# Patient Record
Sex: Female | Born: 1980 | Race: White | Hispanic: No | Marital: Single | Smoking: Former smoker
Health system: Southern US, Community
[De-identification: ages and names within clinical notes are randomized; demographics above are authoritative.]

## PROBLEM LIST (undated history)

## (undated) DIAGNOSIS — D1803 Hemangioma of intra-abdominal structures: Secondary | ICD-10-CM

## (undated) DIAGNOSIS — N809 Endometriosis, unspecified: Secondary | ICD-10-CM

## (undated) DIAGNOSIS — R112 Nausea with vomiting, unspecified: Secondary | ICD-10-CM

## (undated) DIAGNOSIS — G43909 Migraine, unspecified, not intractable, without status migrainosus: Secondary | ICD-10-CM

## (undated) DIAGNOSIS — Z8719 Personal history of other diseases of the digestive system: Secondary | ICD-10-CM

## (undated) DIAGNOSIS — Z9889 Other specified postprocedural states: Secondary | ICD-10-CM

## (undated) DIAGNOSIS — D1800 Hemangioma unspecified site: Secondary | ICD-10-CM

## (undated) DIAGNOSIS — N83209 Unspecified ovarian cyst, unspecified side: Secondary | ICD-10-CM

## (undated) DIAGNOSIS — S62609A Fracture of unspecified phalanx of unspecified finger, initial encounter for closed fracture: Secondary | ICD-10-CM

## (undated) DIAGNOSIS — D649 Anemia, unspecified: Secondary | ICD-10-CM

## (undated) DIAGNOSIS — I499 Cardiac arrhythmia, unspecified: Secondary | ICD-10-CM

## (undated) DIAGNOSIS — K429 Umbilical hernia without obstruction or gangrene: Secondary | ICD-10-CM

## (undated) DIAGNOSIS — Z973 Presence of spectacles and contact lenses: Secondary | ICD-10-CM

## (undated) DIAGNOSIS — F429 Obsessive-compulsive disorder, unspecified: Secondary | ICD-10-CM

## (undated) DIAGNOSIS — F419 Anxiety disorder, unspecified: Secondary | ICD-10-CM

## (undated) DIAGNOSIS — M5430 Sciatica, unspecified side: Secondary | ICD-10-CM

## (undated) DIAGNOSIS — M5412 Radiculopathy, cervical region: Secondary | ICD-10-CM

## (undated) HISTORY — PX: DIAGNOSTIC LAPAROSCOPY: SUR761

## (undated) HISTORY — PX: APPENDECTOMY: SHX54

## (undated) HISTORY — DX: Cardiac arrhythmia, unspecified: I49.9

## (undated) HISTORY — PX: WISDOM TOOTH EXTRACTION: SHX21

## (undated) HISTORY — PX: OTHER SURGICAL HISTORY: SHX169

## (undated) HISTORY — PX: DILATION AND CURETTAGE OF UTERUS: SHX78

---
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
## 2000-08-24 ENCOUNTER — Encounter: Payer: Self-pay | Admitting: Emergency Medicine

## 2000-08-24 ENCOUNTER — Emergency Department (HOSPITAL_COMMUNITY): Admission: EM | Admit: 2000-08-24 | Discharge: 2000-08-24 | Payer: Self-pay | Admitting: Emergency Medicine

## 2001-01-21 ENCOUNTER — Emergency Department (HOSPITAL_COMMUNITY): Admission: EM | Admit: 2001-01-21 | Discharge: 2001-01-21 | Payer: Self-pay

## 2001-06-11 ENCOUNTER — Other Ambulatory Visit: Admission: RE | Admit: 2001-06-11 | Discharge: 2001-06-11 | Payer: Self-pay | Admitting: Obstetrics and Gynecology

## 2001-06-15 ENCOUNTER — Inpatient Hospital Stay (HOSPITAL_COMMUNITY): Admission: AD | Admit: 2001-06-15 | Discharge: 2001-06-15 | Payer: Self-pay | Admitting: Obstetrics and Gynecology

## 2001-06-21 ENCOUNTER — Inpatient Hospital Stay (HOSPITAL_COMMUNITY): Admission: AD | Admit: 2001-06-21 | Discharge: 2001-06-21 | Payer: Self-pay | Admitting: Obstetrics and Gynecology

## 2001-08-17 ENCOUNTER — Inpatient Hospital Stay (HOSPITAL_COMMUNITY): Admission: AD | Admit: 2001-08-17 | Discharge: 2001-08-17 | Payer: Self-pay | Admitting: Obstetrics and Gynecology

## 2001-09-09 ENCOUNTER — Inpatient Hospital Stay (HOSPITAL_COMMUNITY): Admission: AD | Admit: 2001-09-09 | Discharge: 2001-09-09 | Payer: Self-pay | Admitting: Obstetrics and Gynecology

## 2001-09-09 ENCOUNTER — Encounter: Payer: Self-pay | Admitting: Obstetrics and Gynecology

## 2001-09-28 ENCOUNTER — Inpatient Hospital Stay (HOSPITAL_COMMUNITY): Admission: AD | Admit: 2001-09-28 | Discharge: 2001-09-28 | Payer: Self-pay | Admitting: Obstetrics and Gynecology

## 2001-09-28 ENCOUNTER — Encounter: Payer: Self-pay | Admitting: Obstetrics and Gynecology

## 2001-10-01 ENCOUNTER — Inpatient Hospital Stay (HOSPITAL_COMMUNITY): Admission: AD | Admit: 2001-10-01 | Discharge: 2001-10-01 | Payer: Self-pay | Admitting: Obstetrics and Gynecology

## 2001-12-12 ENCOUNTER — Inpatient Hospital Stay (HOSPITAL_COMMUNITY): Admission: AD | Admit: 2001-12-12 | Discharge: 2001-12-12 | Payer: Self-pay | Admitting: Obstetrics and Gynecology

## 2001-12-24 ENCOUNTER — Inpatient Hospital Stay (HOSPITAL_COMMUNITY): Admission: AD | Admit: 2001-12-24 | Discharge: 2001-12-24 | Payer: Self-pay | Admitting: Obstetrics and Gynecology

## 2001-12-25 ENCOUNTER — Inpatient Hospital Stay (HOSPITAL_COMMUNITY): Admission: AD | Admit: 2001-12-25 | Discharge: 2001-12-27 | Payer: Self-pay | Admitting: Obstetrics and Gynecology

## 2002-07-15 ENCOUNTER — Emergency Department (HOSPITAL_COMMUNITY): Admission: EM | Admit: 2002-07-15 | Discharge: 2002-07-15 | Payer: Self-pay | Admitting: Emergency Medicine

## 2002-07-15 ENCOUNTER — Encounter: Payer: Self-pay | Admitting: Emergency Medicine

## 2002-12-29 ENCOUNTER — Other Ambulatory Visit: Admission: RE | Admit: 2002-12-29 | Discharge: 2002-12-29 | Payer: Self-pay | Admitting: Obstetrics and Gynecology

## 2004-02-11 ENCOUNTER — Other Ambulatory Visit: Admission: RE | Admit: 2004-02-11 | Discharge: 2004-02-11 | Payer: Self-pay | Admitting: Obstetrics and Gynecology

## 2004-06-03 ENCOUNTER — Emergency Department (HOSPITAL_COMMUNITY): Admission: EM | Admit: 2004-06-03 | Discharge: 2004-06-04 | Payer: Self-pay | Admitting: Emergency Medicine

## 2004-07-12 ENCOUNTER — Encounter: Admission: RE | Admit: 2004-07-12 | Discharge: 2004-09-28 | Payer: Self-pay | Admitting: Orthopaedic Surgery

## 2004-07-26 ENCOUNTER — Encounter: Admission: RE | Admit: 2004-07-26 | Discharge: 2004-07-26 | Payer: Self-pay | Admitting: Orthopaedic Surgery

## 2004-10-28 DIAGNOSIS — S62609A Fracture of unspecified phalanx of unspecified finger, initial encounter for closed fracture: Secondary | ICD-10-CM

## 2004-10-28 HISTORY — DX: Fracture of unspecified phalanx of unspecified finger, initial encounter for closed fracture: S62.609A

## 2004-11-07 ENCOUNTER — Emergency Department (HOSPITAL_COMMUNITY): Admission: EM | Admit: 2004-11-07 | Discharge: 2004-11-07 | Payer: Self-pay | Admitting: Family Medicine

## 2005-02-27 ENCOUNTER — Emergency Department (HOSPITAL_COMMUNITY): Admission: EM | Admit: 2005-02-27 | Discharge: 2005-02-27 | Payer: Self-pay | Admitting: Emergency Medicine

## 2005-04-24 ENCOUNTER — Other Ambulatory Visit: Admission: RE | Admit: 2005-04-24 | Discharge: 2005-04-24 | Payer: Self-pay | Admitting: Obstetrics and Gynecology

## 2006-08-20 ENCOUNTER — Emergency Department (HOSPITAL_COMMUNITY): Admission: EM | Admit: 2006-08-20 | Discharge: 2006-08-20 | Payer: Self-pay | Admitting: Emergency Medicine

## 2006-08-30 ENCOUNTER — Emergency Department (HOSPITAL_COMMUNITY): Admission: EM | Admit: 2006-08-30 | Discharge: 2006-08-30 | Payer: Self-pay | Admitting: Emergency Medicine

## 2006-09-03 ENCOUNTER — Inpatient Hospital Stay (HOSPITAL_COMMUNITY): Admission: AD | Admit: 2006-09-03 | Discharge: 2006-09-03 | Payer: Self-pay | Admitting: Obstetrics & Gynecology

## 2006-11-26 ENCOUNTER — Ambulatory Visit (HOSPITAL_COMMUNITY): Admission: RE | Admit: 2006-11-26 | Discharge: 2006-11-26 | Payer: Self-pay | Admitting: Obstetrics and Gynecology

## 2006-11-26 ENCOUNTER — Encounter (INDEPENDENT_AMBULATORY_CARE_PROVIDER_SITE_OTHER): Payer: Self-pay | Admitting: Obstetrics and Gynecology

## 2006-12-30 ENCOUNTER — Inpatient Hospital Stay (HOSPITAL_COMMUNITY): Admission: AD | Admit: 2006-12-30 | Discharge: 2006-12-30 | Payer: Self-pay | Admitting: Obstetrics and Gynecology

## 2007-02-14 ENCOUNTER — Emergency Department (HOSPITAL_COMMUNITY): Admission: EM | Admit: 2007-02-14 | Discharge: 2007-02-14 | Payer: Self-pay | Admitting: Emergency Medicine

## 2007-02-15 ENCOUNTER — Emergency Department (HOSPITAL_COMMUNITY): Admission: EM | Admit: 2007-02-15 | Discharge: 2007-02-15 | Payer: Self-pay | Admitting: Emergency Medicine

## 2007-05-12 ENCOUNTER — Emergency Department (HOSPITAL_COMMUNITY): Admission: EM | Admit: 2007-05-12 | Discharge: 2007-05-12 | Payer: Self-pay | Admitting: Emergency Medicine

## 2007-07-12 ENCOUNTER — Emergency Department (HOSPITAL_COMMUNITY): Admission: EM | Admit: 2007-07-12 | Discharge: 2007-07-12 | Payer: Self-pay | Admitting: Emergency Medicine

## 2007-12-04 ENCOUNTER — Emergency Department (HOSPITAL_COMMUNITY): Admission: EM | Admit: 2007-12-04 | Discharge: 2007-12-04 | Payer: Self-pay | Admitting: Emergency Medicine

## 2008-12-07 ENCOUNTER — Emergency Department (HOSPITAL_COMMUNITY): Admission: EM | Admit: 2008-12-07 | Discharge: 2008-12-07 | Payer: Self-pay | Admitting: Emergency Medicine

## 2008-12-08 ENCOUNTER — Emergency Department (HOSPITAL_COMMUNITY): Admission: EM | Admit: 2008-12-08 | Discharge: 2008-12-09 | Payer: Self-pay | Admitting: Emergency Medicine

## 2009-02-19 ENCOUNTER — Emergency Department (HOSPITAL_COMMUNITY): Admission: EM | Admit: 2009-02-19 | Discharge: 2009-02-19 | Payer: Self-pay | Admitting: Emergency Medicine

## 2009-10-19 ENCOUNTER — Inpatient Hospital Stay (HOSPITAL_COMMUNITY): Admission: AD | Admit: 2009-10-19 | Discharge: 2009-10-19 | Payer: Self-pay | Admitting: Obstetrics & Gynecology

## 2010-01-01 ENCOUNTER — Inpatient Hospital Stay (HOSPITAL_COMMUNITY): Admission: AD | Admit: 2010-01-01 | Discharge: 2010-01-01 | Payer: Self-pay | Admitting: Obstetrics & Gynecology

## 2010-01-07 ENCOUNTER — Inpatient Hospital Stay (HOSPITAL_COMMUNITY): Admission: AD | Admit: 2010-01-07 | Discharge: 2010-01-09 | Payer: Self-pay | Admitting: Obstetrics & Gynecology

## 2010-05-10 LAB — URINALYSIS, ROUTINE W REFLEX MICROSCOPIC
Glucose, UA: NEGATIVE mg/dL
Hgb urine dipstick: NEGATIVE
Ketones, ur: NEGATIVE mg/dL
Nitrite: NEGATIVE
Protein, ur: NEGATIVE mg/dL
Specific Gravity, Urine: 1.015 (ref 1.005–1.030)
Urobilinogen, UA: 0.2 mg/dL (ref 0.0–1.0)
pH: 6.5 (ref 5.0–8.0)

## 2010-05-10 LAB — HEPATITIS B SURFACE ANTIGEN
Hepatitis B Surface Ag: NEGATIVE
Hepatitis B Surface Ag: NEGATIVE

## 2010-05-10 LAB — CBC
HCT: 29 % — ABNORMAL LOW (ref 36.0–46.0)
HCT: 38.1 % (ref 36.0–46.0)
Hemoglobin: 13 g/dL (ref 12.0–15.0)
MCH: 31.1 pg (ref 26.0–34.0)
MCHC: 34.6 g/dL (ref 30.0–36.0)
RBC: 3.17 MIL/uL — ABNORMAL LOW (ref 3.87–5.11)
RDW: 14.6 % (ref 11.5–15.5)

## 2010-05-12 LAB — WET PREP, GENITAL: Yeast Wet Prep HPF POC: NONE SEEN

## 2010-05-12 LAB — URINALYSIS, ROUTINE W REFLEX MICROSCOPIC
Ketones, ur: NEGATIVE mg/dL
Protein, ur: NEGATIVE mg/dL
Urobilinogen, UA: 1 mg/dL (ref 0.0–1.0)

## 2010-07-12 NOTE — H&P (Signed)
NAMEMARLEE, [name]                 ACCOUNT NO.:  [id number]   MEDICAL RECORD NO.:  [id number]          PATIENT TYPE:  AMB   LOCATION:  SDC                           FACILITY:  [facility]   PHYSICIAN:  Sherron Monday, MD        DATE OF BIRTH:  Aug 17, 1980   DATE OF ADMISSION:  DATE OF DISCHARGE:                              HISTORY & PHYSICAL   PROCEDURE PLANNED:  Suction D&E.   ADMISSION DIAGNOSIS:  Missed abortion.   HISTORY OF PRESENT ILLNESS:  A 30 year old G2, P1-0-0-1 at 9 weeks 2  days by dates presented to the office initially on November 14, 2006.  Was found to have a crown-rump length consistent with 7 weeks 4 days,  normal ovaries, no fetal heart tones.  The decision was made after  counseling to do another ultrasound in approximately a week to make sure  that there was no change.  An ultrasound was again performed.  On  September 25, crown-rump length was unchanged and no fetal heart tones.  At that time we discussed expectant management versus Cytotec versus a  suction D&E including the risks, benefits and alternatives of bleeding,  infection, damage to the uterus.  The patient desires the D&E.  This  will be performed on September 29.  She states she has had nausea and  vomiting and some cramping but no bleeding.   PAST MEDICAL HISTORY:  Not significant.   PAST SURGICAL HISTORY:  Significant for appendectomy well as well a  laparoscopy for lysis of adhesions.   PAST OBSTETRICAL AND GYNECOLOGICAL HISTORY:  G1 was a term vaginal  delivery of a 7-pound 1-ounce infant.  No abnormal Pap smears.  Her last  was in July 2008.  No sexually transmitted diseases.  Menarche at age  36, regular monthly cycles.   MEDICATIONS:  Include prenatal vitamins.   ALLERGIES:  No known drug allergies.   SOCIAL HISTORY:  Denies alcohol, tobacco or drug use.  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   FAMILY HISTORY:  Significant for hypertension in a maternal  grandmother,  a GYN cancer in her family as well as the maternal grandmother with  breast and uterine cancer.  No clotting disorders.  History of  endometriosis in her mother.   EXAMINATION:  She is 5 feet 9 inches and weighs 138 pounds.  GENERAL:  She is in no apparent distress.  CARDIOVASCULAR:  Regular rate and rhythm.  LUNGS:  Clear to auscultation bilaterally.  ABDOMEN:  Soft, nontender, nondistended.  Positive bowel sounds.  EXTREMITIES:  Symmetric and nontender.   Ultrasound findings were as above.   ASSESSMENT AND PLAN:  A 30 year old gravida 2, para 1 with a missed  abortion who I will perform a dilatation and evacuation on on September  29.  Discussed with the patient risks, benefits and alternatives  including bleeding, infection, damage to the uterus.  Wished to proceed.  This patient was given reassurance and discussed with her approximately  1/5 chance of a miscarriage in any pregnancy, nothing that can be done  to prevent, and there should be no problems with any future pregnancy.  The patient voices understanding and will get doxycycline prior to the  procedure on Monday.      Sherron Monday, MD  Electronically Signed     JB/MEDQ  D:  11/23/2006  T:  11/24/2006  Job:  [id number]

## 2010-07-12 NOTE — Op Note (Signed)
NAMEKILEEN, LANGE                 ACCOUNT NO.:  [id number]   MEDICAL RECORD NO.:  [id number]          PATIENT TYPE:  AMB   LOCATION:  SDC                           [facility]ILITY:  [facility]   PHYSICIAN:  Sherron Monday, MD        DATE OF BIRTH:  March 01, 1980   DATE OF PROCEDURE:  11/26/2006  DATE OF DISCHARGE:                               OPERATIVE REPORT   PREOPERATIVE DIAGNOSIS:  Missed abortion.   POSTOPERATIVE DIAGNOSIS:  Missed abortion.   PROCEDURE:  Suction dilation and evacuation.   SURGEON:  Sherron Monday, M.D.   ANESTHESIA:  MAC with 20 mL of 2% lidocaine.   FINDINGS:  Ultrasound in the office revealed at 9 weeks' gestation a 7  plus 4 week crown/rump length fetal pole without heart tones.  Uterus  sounds to 9 cm; 7-French curette is used.   SPECIMEN:  Products of conception to pathology.   ESTIMATED BLOOD LOSS:  100 mL.   IV FLUIDS:  300 mL.   URINE OUTPUT:  100 mL.   I&O catheterization at the start of the procedure.   COMPLICATIONS:  None.   DISPOSITION:  Stable to PACU.   PROCEDURE:  After risks, benefits and alternatives of the surgical  procedure was discussed with the patient including risk of bleeding,  infection, damage to the uterus, she was transported to the operating  room, placed on the table in supine position.  Her anesthesia was  induced, and she was found to be sedated.  She was then placed in the  yellow fin stirrups, prepped and draped in the normal sterile fashion.  Her bladder was sterilely drained.  An open-sided speculum was placed in  her vagina.  The cervix was easily visualized.  Mucus and blood were  noted in her vagina.  Her uterus was sounded to 9 cm.  Her cervix was  dilated to accommodate a 7-French curette.  Several  passes with a curette were made, removing products of conception.  A  final pass was clear.  The tenaculum was removed from her cervix and  noted to be hemostatic.  Sponge, lap and needle counts were correct x2  at the end of  the procedure.  The patient was taken in stable condition  to the PACU following the procedure.      Sherron Monday, MD  Electronically Signed     JB/MEDQ  D:  11/26/2006  T:  11/26/2006  Job:  [id number]
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
## 2010-11-21 LAB — POCT CARDIAC MARKERS
Operator id: 277751
Troponin i, poc: <0.05

## 2010-11-21 LAB — CBC
HCT: 40.2
Hemoglobin: 13.8
MCV: 87.5
Platelets: 234
RBC: 4.6
WBC: 12.8 — ABNORMAL HIGH

## 2010-11-21 LAB — DIFFERENTIAL
Basophils Absolute: 0.1
Basophils Relative: 1
Eosinophils Absolute: 0.1
Eosinophils Relative: 1
Lymphocytes Relative: 24
Lymphs Abs: 3.1
Monocytes Absolute: 0.8
Monocytes Relative: 6
Neutro Abs: 8.7 — ABNORMAL HIGH
Neutrophils Relative %: 68

## 2010-11-21 LAB — I-STAT 8, (EC8 V) (CONVERTED LAB)
Bicarbonate: 22.8
Chloride: 107
HCT: 44
Hemoglobin: 15
Operator id: 277751
Sodium: 138
pCO2, Ven: 32 — ABNORMAL LOW

## 2010-11-23 LAB — POCT I-STAT, CHEM 8
BUN: 13
Calcium, Ion: 1.17
Chloride: 104
Creatinine, Ser: 0.9
Glucose, Bld: 109 — ABNORMAL HIGH
HCT: 42
Hemoglobin: 14.3
Potassium: 3.9
Sodium: 138
TCO2: 24

## 2010-11-23 LAB — URINE MICROSCOPIC-ADD ON

## 2010-11-23 LAB — URINALYSIS, ROUTINE W REFLEX MICROSCOPIC
Bilirubin Urine: NEGATIVE
Glucose, UA: NEGATIVE
Ketones, ur: NEGATIVE
Leukocytes, UA: NEGATIVE
Nitrite: NEGATIVE
Protein, ur: NEGATIVE
Specific Gravity, Urine: 1.023
Urobilinogen, UA: 0.2
pH: 5.5

## 2010-11-23 LAB — POCT PREGNANCY, URINE
Operator id: 27065
Preg Test, Ur: NEGATIVE

## 2010-12-02 LAB — POCT PREGNANCY, URINE
Operator id: 285841
Preg Test, Ur: NEGATIVE

## 2010-12-08 LAB — CBC
HCT: 39
MCV: 89.9
Platelets: 287
RDW: 12.8
WBC: 10

## 2010-12-13 LAB — CBC
HCT: 39.6
Hemoglobin: 13.3
Platelets: 231
RBC: 4.43
RDW: 12.5
RDW: 13.3

## 2010-12-13 LAB — URINALYSIS, ROUTINE W REFLEX MICROSCOPIC
Bilirubin Urine: NEGATIVE
Glucose, UA: NEGATIVE
Ketones, ur: NEGATIVE
Ketones, ur: NEGATIVE
Leukocytes, UA: NEGATIVE
Nitrite: NEGATIVE
Protein, ur: NEGATIVE
Specific Gravity, Urine: <1.005 — ABNORMAL LOW
Urobilinogen, UA: 0.2
pH: 7
pH: 7

## 2010-12-13 LAB — I-STAT 8, (EC8 V) (CONVERTED LAB)
Acid-base deficit: 2
BUN: 15
Bicarbonate: 23.7
Chloride: 106
Glucose, Bld: 101 — ABNORMAL HIGH
HCT: 45
Hemoglobin: 15.3 — ABNORMAL HIGH
Operator id: 288331
Potassium: 4
Sodium: 139
TCO2: 25
pCO2, Ven: 41.4 — ABNORMAL LOW
pH, Ven: 7.365 — ABNORMAL HIGH

## 2010-12-13 LAB — DIFFERENTIAL
Basophils Absolute: 0
Eosinophils Relative: 1
Lymphocytes Relative: 13

## 2010-12-13 LAB — POCT PREGNANCY, URINE
Operator id: 120561
Preg Test, Ur: NEGATIVE

## 2010-12-13 LAB — URINE MICROSCOPIC-ADD ON

## 2010-12-13 LAB — WET PREP, GENITAL
WBC, Wet Prep HPF POC: NONE SEEN
Yeast Wet Prep HPF POC: NONE SEEN

## 2010-12-13 LAB — POCT I-STAT CREATININE
Creatinine, Ser: 0.8
Operator id: 288331

## 2012-11-15 ENCOUNTER — Ambulatory Visit (INDEPENDENT_AMBULATORY_CARE_PROVIDER_SITE_OTHER): Payer: Medicaid Other | Admitting: Physician Assistant

## 2012-11-15 ENCOUNTER — Telehealth: Payer: Self-pay | Admitting: Family Medicine

## 2012-11-15 ENCOUNTER — Telehealth: Payer: Self-pay | Admitting: Nurse Practitioner

## 2012-11-15 VITALS — BP 103/69 | HR 73 | Temp 98.6°F | Wt 189.0 lb

## 2012-11-15 DIAGNOSIS — R6884 Jaw pain: Secondary | ICD-10-CM

## 2012-11-15 MED ORDER — CELECOXIB 200 MG PO CAPS: 200.0000 mg | ORAL_CAPSULE | Freq: Two times a day (BID) | ORAL | Status: DC

## 2012-11-15 NOTE — Telephone Encounter (Signed)
Pt notified of appt 
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
## 2012-11-15 NOTE — Patient Instructions (Signed)
Apply cold compresses x 30 minutes each. Avoid laying on Left side of face. RTC in 1 wk or sooner if symptoms fail to improve.
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
## 2012-11-21 ENCOUNTER — Telehealth: Payer: Self-pay | Admitting: *Deleted

## 2012-11-21 NOTE — Telephone Encounter (Signed)
Ins denied celebrex

## 2012-11-22 ENCOUNTER — Ambulatory Visit (INDEPENDENT_AMBULATORY_CARE_PROVIDER_SITE_OTHER): Payer: Medicaid Other | Admitting: Nurse Practitioner

## 2012-11-22 ENCOUNTER — Encounter: Payer: Self-pay | Admitting: Nurse Practitioner

## 2012-11-22 VITALS — BP 116/69 | HR 81 | Temp 98.2°F | Ht <= 58 in | Wt 189.0 lb

## 2012-11-22 DIAGNOSIS — R6884 Jaw pain: Secondary | ICD-10-CM

## 2012-11-22 DIAGNOSIS — Z7189 Other specified counseling: Secondary | ICD-10-CM

## 2012-11-22 DIAGNOSIS — Z716 Tobacco abuse counseling: Secondary | ICD-10-CM

## 2012-11-22 MED ORDER — VARENICLINE TARTRATE 1 MG PO TABS: 1.0000 mg | ORAL_TABLET | Freq: Two times a day (BID) | ORAL | Status: DC

## 2012-11-22 MED ORDER — VARENICLINE TARTRATE 0.5 MG X 11 & 1 MG X 42 PO MISC: ORAL | Status: DC

## 2012-11-22 NOTE — Patient Instructions (Addendum)
Smoking Cessation Quitting smoking is important to your health and has many advantages. However, it is not always easy to quit since nicotine is a very addictive drug. Often times, people try 3 times or more before being able to quit. This document explains the best ways for you to prepare to quit smoking. Quitting takes hard work and a lot of effort, but you can do it. ADVANTAGES OF QUITTING SMOKING  You will live longer, feel better, and live better.  Your body will feel the impact of quitting smoking almost immediately.  Within 20 minutes, blood pressure decreases. Your pulse returns to its normal level.  After 8 hours, carbon monoxide levels in the blood return to normal. Your oxygen level increases.  After 24 hours, the chance of having a heart attack starts to decrease. Your breath, hair, and body stop smelling like smoke.  After 48 hours, damaged nerve endings begin to recover. Your sense of taste and smell improve.  After 72 hours, the body is virtually free of nicotine. Your bronchial tubes relax and breathing becomes easier.  After 2 to 12 weeks, lungs can hold more air. Exercise becomes easier and circulation improves.  The risk of having a heart attack, stroke, cancer, or lung disease is greatly reduced.  After 1 year, the risk of coronary heart disease is cut in half.  After 5 years, the risk of stroke falls to the same as a nonsmoker.  After 10 years, the risk of lung cancer is cut in half and the risk of other cancers decreases significantly.  After 15 years, the risk of coronary heart disease drops, usually to the level of a nonsmoker.  If you are pregnant, quitting smoking will improve your chances of having a healthy baby.  The people you live with, especially any children, will be healthier.  You will have extra money to spend on things other than cigarettes. QUESTIONS TO THINK ABOUT BEFORE ATTEMPTING TO QUIT You may want to talk about your answers with your  caregiver.  Why do you want to quit?  If you tried to quit in the past, what helped and what did not?  What will be the most difficult situations for you after you quit? How will you plan to handle them?  Who can help you through the tough times? Your family? Friends? A caregiver?  What pleasures do you get from smoking? What ways can you still get pleasure if you quit? Here are some questions to ask your caregiver:  How can you help me to be successful at quitting?  What medicine do you think would be best for me and how should I take it?  What should I do if I need more help?  What is smoking withdrawal like? How can I get information on withdrawal? GET READY  Set a quit date.  Change your environment by getting rid of all cigarettes, ashtrays, matches, and lighters in your home, car, or work. Do not let people smoke in your home.  Review your past attempts to quit. Think about what worked and what did not. GET SUPPORT AND ENCOURAGEMENT You have a better chance of being successful if you have help. You can get support in many ways.  Tell your family, friends, and co-workers that you are going to quit and need their support. Ask them not to smoke around you.  Get individual, group, or telephone counseling and support. Programs are available at local hospitals and health centers. Call your local health department for   information about programs in your area.  Spiritual beliefs and practices may help some smokers quit.  Download a "quit meter" on your computer to keep track of quit statistics, such as how long you have gone without smoking, cigarettes not smoked, and money saved.  Get a self-help book about quitting smoking and staying off of tobacco. LEARN NEW SKILLS AND BEHAVIORS  Distract yourself from urges to smoke. Talk to someone, go for a walk, or occupy your time with a task.  Change your normal routine. Take a different route to work. Drink tea instead of coffee.  Eat breakfast in a different place.  Reduce your stress. Take a hot bath, exercise, or read a book.  Plan something enjoyable to do every day. Reward yourself for not smoking.  Explore interactive web-based programs that specialize in helping you quit. GET MEDICINE AND USE IT CORRECTLY Medicines can help you stop smoking and decrease the urge to smoke. Combining medicine with the above behavioral methods and support can greatly increase your chances of successfully quitting smoking.  Nicotine replacement therapy helps deliver nicotine to your body without the negative effects and risks of smoking. Nicotine replacement therapy includes nicotine gum, lozenges, inhalers, nasal sprays, and skin patches. Some may be available over-the-counter and others require a prescription.  Antidepressant medicine helps people abstain from smoking, but how this works is unknown. This medicine is available by prescription.  Nicotinic receptor partial agonist medicine simulates the effect of nicotine in your brain. This medicine is available by prescription. Ask your caregiver for advice about which medicines to use and how to use them based on your health history. Your caregiver will tell you what side effects to look out for if you choose to be on a medicine or therapy. Carefully read the information on the package. Do not use any other product containing nicotine while using a nicotine replacement product.  RELAPSE OR DIFFICULT SITUATIONS Most relapses occur within the first 3 months after quitting. Do not be discouraged if you start smoking again. Remember, most people try several times before finally quitting. You may have symptoms of withdrawal because your body is used to nicotine. You may crave cigarettes, be irritable, feel very hungry, cough often, get headaches, or have difficulty concentrating. The withdrawal symptoms are only temporary. They are strongest when you first quit, but they will go away within  10 14 days. To reduce the chances of relapse, try to:  Avoid drinking alcohol. Drinking lowers your chances of successfully quitting.  Reduce the amount of caffeine you consume. Once you quit smoking, the amount of caffeine in your body increases and can give you symptoms, such as a rapid heartbeat, sweating, and anxiety.  Avoid smokers because they can make you want to smoke.  Do not let weight gain distract you. Many smokers will gain weight when they quit, usually less than 10 pounds. Eat a healthy diet and stay active. You can always lose the weight gained after you quit.  Find ways to improve your mood other than smoking. FOR MORE INFORMATION  www.smokefree.gov  Document Released: 02/07/2001 Document Revised: 08/15/2011 Document Reviewed: 05/25/2011 [employer] Patient Information 2014 [employer].  
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
## 2012-11-26 ENCOUNTER — Telehealth: Payer: Self-pay | Admitting: *Deleted

## 2012-11-26 NOTE — Telephone Encounter (Signed)
Ins denied celebrex, is there any thing else that might work.  Thanks in advance for your attention.

## 2012-12-06 NOTE — Telephone Encounter (Signed)
Advised to substitute Mobic for Celebrex

## 2013-03-12 ENCOUNTER — Encounter: Payer: Self-pay | Admitting: Advanced Practice Midwife

## 2013-03-12 ENCOUNTER — Encounter (INDEPENDENT_AMBULATORY_CARE_PROVIDER_SITE_OTHER): Payer: Self-pay

## 2013-03-12 ENCOUNTER — Ambulatory Visit (INDEPENDENT_AMBULATORY_CARE_PROVIDER_SITE_OTHER): Payer: Medicaid Other | Admitting: Advanced Practice Midwife

## 2013-03-12 VITALS — BP 100/60 | Ht 69.0 in

## 2013-03-12 DIAGNOSIS — Z30017 Encounter for initial prescription of implantable subdermal contraceptive: Secondary | ICD-10-CM

## 2013-03-12 DIAGNOSIS — Z3200 Encounter for pregnancy test, result unknown: Secondary | ICD-10-CM

## 2013-03-12 DIAGNOSIS — I499 Cardiac arrhythmia, unspecified: Secondary | ICD-10-CM | POA: Insufficient documentation

## 2013-03-12 DIAGNOSIS — Z3046 Encounter for surveillance of implantable subdermal contraceptive: Secondary | ICD-10-CM

## 2013-03-12 DIAGNOSIS — Z3202 Encounter for pregnancy test, result negative: Secondary | ICD-10-CM

## 2013-03-12 LAB — POCT URINE PREGNANCY: Preg Test, Ur: NEGATIVE

## 2013-03-12 NOTE — Progress Notes (Signed)
Erica [name] was given informed consent for removal of her Implanon, time out was performed.  Signed copy in the chart.  Appropriate time out taken. Implanon site identified.  Area prepped in usual sterile fashon. 2 cc of 1% lidocaine was used to anesthetize the area starting with the distal end of the implant. A small stab incision was made right beside the implant on the distal portion.  The implanon rod was grasped using hemostats and removed without difficulty.  There was less than 3 cc blood loss. There were no complications. Next, the area was cleansed again and the new Nexplanon was inserted without difficulty.  Steri strips and a pressure bandage were applied.  Pt was instructed to remove pressure bandage in a few hours, and keep insertion site covered with a bandaid for 3 days.  Follow-up scheduled PRN problems  CRESENZO-DISHMAN,Jeremaih Klima 03/12/2013 9:51 AM

## 2013-05-02 ENCOUNTER — Ambulatory Visit (INDEPENDENT_AMBULATORY_CARE_PROVIDER_SITE_OTHER): Payer: Medicaid Other | Admitting: Nurse Practitioner

## 2013-05-02 ENCOUNTER — Encounter: Payer: Self-pay | Admitting: Nurse Practitioner

## 2013-05-02 VITALS — BP 108/75 | HR 101 | Temp 97.9°F | Ht 69.0 in | Wt 198.0 lb

## 2013-05-02 DIAGNOSIS — F411 Generalized anxiety disorder: Secondary | ICD-10-CM | POA: Insufficient documentation

## 2013-05-02 DIAGNOSIS — F429 Obsessive-compulsive disorder, unspecified: Secondary | ICD-10-CM

## 2013-05-02 MED ORDER — SERTRALINE HCL 100 MG PO TABS: 100.0000 mg | ORAL_TABLET | Freq: Every day | ORAL | Status: DC

## 2013-05-02 NOTE — Patient Instructions (Signed)
Obsessive-Compulsive Disorder Obsessive-compulsive disorder (OCD) is a mental illness. People with OCD have obsessions, compulsions, or both. Obsessions are unwanted and distressing thoughts, ideas, or urges that keep entering your mind. You may find yourself trying to ignore them. You may try to stop or undo them with a compulsion. Compulsions are repetitive physical or mental acts that you feel driven to perform. The actsmay seem senseless or excessive. They usually reduce or prevent any emotional distress. Obsessions and compulsions can be time consuming (take more than 1 hour a day). They can interfere with personal relationships and normal activities at home, school, or work.  OCD usually starts in young adulthood and continues throughout life. People who have family members with OCD are at higher risk for developing OCD. Women are at higher risk for OCD during and after pregnancy. Many people with OCD also have depression or another mental health disorder. SYMPTOMS  People with obsessions usually have a fear that something terrible will happen or that they will do something terrible. Examples of common obsessions include:   Fear of contamination with germs, bodily waste, or poisonous substances.  Fear of making the wrong decision (self-doubt).  Violent or sexual thoughts or urges towards others.  Need for symmetry or exactness. Examples of typical compulsive acts include:   Excessive hand washing or bathing due to fear of contamination.  Checking things over and over to make sure a task has been completed. For example, making sure a door is locked or a toaster is unplugged.  Repeating an act or phrase over and over, sometimes a specific number of times, until it feels right.  Arranging and rearranging objects to keep them in a certain order. DIAGNOSIS  OCD is diagnosed through an assessment by your health care provider. Your health care provider will ask questions about any obsessions  or compulsions you have and how they affect your life. Your health care provider may also ask about your medical history, prescription medicine, and drug use. Certain medical conditions and substances can cause symptoms similar to OCD. TREATMENT  The following forms of treatment are available for OCD:  Cognitive therapy. This is a form of talk therapy. The goal is to identify and change the irrational thoughts associated with obsessions.  Behavioral therapy. This is also a form of talk therapy. The goal is to target and reduce compulsive acts (behaviors). A specific type of behavioral therapy called exposure and response prevention is considered the most effective treatment for OCD. You will be exposed to the distressing situation that triggers your compulsion and prevented from responding to it. With repetition of this process over time, you will no longer feel the distress or need to perform the compulsion.   Medicine. Certain types of antidepressant medicine may help reduce or control OCD symptoms. Medication is most effective when it is used with cognitive or behavioral therapy. For severe OCD that does not respond to talk therapy and medication, brain surgery or electrical stimulation of specific areas of the brain (deep brain stimulation) may be helpful. HOME CARE INSTRUCTIONS   Take all medicines as prescribed.  Check with your health care provider before starting new prescription or over-the-counter medicines. SEEK MEDICAL CARE IF:  If you are not able to take your medicines as prescribed.  If your symptoms get worse. SEEK IMMEDIATE MEDICAL CARE IF:  You feel that any of your ideas or actions are slipping out of your control. Document Released: 02/07/2001 Document Revised: 12/04/2012 Document Reviewed: 09/27/2012 ExitCare Patient Information 2014   [employer].

## 2013-05-02 NOTE — Progress Notes (Signed)
   Subjective:    Patient ID: Erica [name], female    DOB: 05-03-1980, 33 y.o.   MRN: [id number]  HPI Patient in  To discuss anxiety - was on zoloft but patient [facility] level is out [facility]. Patient has severs OCD about her house being clean and she has a husband and kids that do not understand her need for clean- She has also started college courses and is obscessed with making straight A's. SHe really needs her meds back.    Review of Systems  Constitutional: Negative.   HENT: Negative.   Respiratory: Negative.   Cardiovascular: Negative.   Gastrointestinal: Negative.   Genitourinary: Negative.   Musculoskeletal: Negative.   Psychiatric/Behavioral: Negative.   All other systems reviewed and are negative.       Objective:   Physical Exam  Constitutional: She is oriented to person, place, and time. She appears well-developed and well-nourished.  Cardiovascular: Normal rate, regular rhythm and normal heart sounds.   Pulmonary/Chest: Effort normal and breath sounds normal.  Neurological: She is alert and oriented to person, place, and time.  Skin: Skin is warm and dry.  Psychiatric: She has a normal mood and affect. Her behavior is normal. Judgment and thought content normal.   BP 108/75  Pulse 101  Temp(Src) 97.9 F (36.6 C) (Oral)  Ht 5\' 9"  (1.753 m)  Wt 198 lb (89.812 kg)  BMI 29.23 kg/m2        Assessment & Plan:   1. GAD (generalized anxiety disorder)   2. OCD (obsessive compulsive disorder)    Meds ordered this encounter  Medications  . sertraline (ZOLOFT) 100 MG tablet    Sig: Take 1 tablet (100 mg total) by mouth daily.    Dispense:  30 tablet    Refill:  5    Order Specific Question:  Supervising Provider    Answer:  Chipper Herb [[id number]]   Stress management Side effects reviewed  Mary-Margaret Hassell Done, FNP

## 2013-05-12 ENCOUNTER — Other Ambulatory Visit: Payer: Self-pay | Admitting: *Deleted

## 2013-05-12 ENCOUNTER — Telehealth: Payer: Self-pay | Admitting: *Deleted

## 2013-05-12 ENCOUNTER — Other Ambulatory Visit: Payer: Self-pay | Admitting: Nurse Practitioner

## 2013-05-12 NOTE — Telephone Encounter (Signed)
Needs to have LFT checked before getting meds- NTBS

## 2013-05-12 NOTE — Telephone Encounter (Incomplete)
This encounter was created in error - please disregard.

## 2013-05-12 NOTE — Telephone Encounter (Signed)
Received a fax request from [facility]. Pt requesting Ketoconazole 200mg  tab take one tablet by mouth every day, #5. Last refill was 10/24/11. I don't see this medication in Epic list and I can't find it to create a RX request.  Please review.

## 2013-05-23 ENCOUNTER — Encounter: Payer: Self-pay | Admitting: Physician Assistant

## 2013-05-23 ENCOUNTER — Encounter (INDEPENDENT_AMBULATORY_CARE_PROVIDER_SITE_OTHER): Payer: Self-pay

## 2013-05-23 ENCOUNTER — Ambulatory Visit (INDEPENDENT_AMBULATORY_CARE_PROVIDER_SITE_OTHER): Payer: Medicaid Other | Admitting: Physician Assistant

## 2013-05-23 VITALS — BP 111/73 | HR 88 | Temp 98.2°F | Ht 69.0 in | Wt 196.0 lb

## 2013-05-23 DIAGNOSIS — B360 Pityriasis versicolor: Secondary | ICD-10-CM

## 2013-05-28 ENCOUNTER — Telehealth: Payer: Self-pay | Admitting: Physician Assistant

## 2013-05-28 MED ORDER — KETOCONAZOLE 1 % EX SHAM: MEDICATED_SHAMPOO | CUTANEOUS | Status: DC

## 2013-05-28 MED ORDER — FLUCONAZOLE 150 MG PO TABS: 150.0000 mg | ORAL_TABLET | ORAL | Status: DC

## 2013-05-28 NOTE — Telephone Encounter (Signed)
T gann - can you address this ASAP  There arent any notes for me in [facility]

## 2013-05-28 NOTE — Telephone Encounter (Signed)
Provider to address this and pt aware to pick up RX from pharm late tonight or tomorrow.-jhb

## 2013-05-29 ENCOUNTER — Telehealth: Payer: Self-pay | Admitting: *Deleted

## 2013-05-29 NOTE — Telephone Encounter (Signed)
Yes that will be fine to change to 2%

## 2013-05-29 NOTE — Telephone Encounter (Signed)
Pharmacy faxed stating that Ketoconazole rx shampoo that was prescribed yesterday comes in 2%. Is it ok to change?

## 2013-06-02 NOTE — Telephone Encounter (Signed)
Called in.

## 2013-06-07 NOTE — Telephone Encounter (Signed)
Talked with Roselyn Reef and medication issue was taken care of. Roselyn Reef sent scripts over to pharmacy. TG

## 2013-06-07 NOTE — Progress Notes (Signed)
Subjective:     Patient ID: Erica [name], female   DOB: Jul 15, 1980, 33 y.o.   MRN: [id number]  HPI 33 y/o caucasian female presents with c/o previous and current episodes of tinea versicolor. Has used Terbinifine in the past with relief along with Nizoral and Selsun Blue Shampoo. Worse with heat.   Review of Systems  Denies pruritis, fever, tick or insect bite, xerosis or change in detergents, soap, medication.  Pertinent positives: generalized rash on trunk, BUE x months. Previous episodes that were relieved with treatment     Objective:   Physical Exam Hypopigmented generalized rash on trunk and BUE indicating tinea versicolor.     Assessment:    1. Tinea Versicolor:           Plan:     Rx Ketoconazole wash: use daily x 14 days, then prn   Rx Fluconazole 150 mg tabs q 7 days x 4 wks.   Eplained to patient to leave wash on as directed and had a discussion that although the fungus will be treated with medications prescribed, it will take months for the hypopigmented patches to fade.

## 2013-06-13 ENCOUNTER — Ambulatory Visit (INDEPENDENT_AMBULATORY_CARE_PROVIDER_SITE_OTHER): Payer: Medicaid Other | Admitting: Physician Assistant

## 2013-06-13 ENCOUNTER — Encounter: Payer: Self-pay | Admitting: Physician Assistant

## 2013-06-13 VITALS — BP 111/70 | HR 97 | Temp 98.1°F | Ht 69.0 in | Wt 195.6 lb

## 2013-06-13 DIAGNOSIS — B360 Pityriasis versicolor: Secondary | ICD-10-CM

## 2013-06-13 NOTE — Progress Notes (Signed)
   Subjective:    Patient ID: Erica [name], female    DOB: Jun 17, 1980, 33 y.o.   MRN: [id number]  HPI 33 y/o female presents s/p dx of tinea versicolor x 3 weeks ago. Has taken Diflucan 150 mg x 2 doses and has been using Ketoconazole wash daily. Patient is pleased with improvement. Has also been using dove soap.     Review of Systems  Denies itching, pain, burning of skin Endorses mild rash of skin and erythema      Objective:   Physical Exam  Lesions of PI hyperpigmentation on back that I have advised her will fade with time. Significant reduction in areas of tinea versicolor on back, chest and arms. Remaining hypopigmented macules on posterior UE bilaterally and abdomen. Decreased scale and dryness.         Assessment & Plan:  1. Tinea Versicolor: Continue and finish remaining 2 tablets of Diflucan. Continue to use Ketoconazole wash on a daily basis x 2 weeks and then 2-3 x week thereafter. RTC if s/s recur or do not improve.

## 2013-06-13 NOTE — Patient Instructions (Signed)
Tinea Versicolor  Tinea versicolor is a common yeast infection of the skin. This condition becomes known when the yeast on our skin starts to overgrow (yeast is a normal inhabitant on our skin). This condition is noticed as white or light brown patches on brown skin, and is more evident in the summer on tanned skin. These areas are slightly scaly if scratched. The light patches from the yeast become evident when the yeast creates "holes in your suntan". This is most often noticed in the summer. The patches are usually located on the chest, back, pubis, neck and body folds. However, it may occur on any area of body. Mild itching and inflammation (redness or soreness) may be present.  DIAGNOSIS   The diagnosisof this is made clinically (by looking). Cultures from samples are usually not needed. Examination under the microscope may help. However, yeast is normally found on skin. The diagnosis still remains clinical. Examination under Wood's Ultraviolet Light can determine the extent of the infection.  TREATMENT   This common infection is usually only of cosmetic (only a concern to your appearance). It is easily treated with dandruff shampoo used during showers or bathing. Vigorous scrubbing will eliminate the yeast over several days time. The light areas in your skin may remain for weeks or months after the infection is cured unless your skin is exposed to sunlight. The lighter or darker spots caused by the fungus that remain after complete treatment are not a sign of treatment failure; it will take a long time to resolve. Your caregiver may recommend a number of commercial preparations or medication by mouth if home care is not working. Recurrence is common and preventative medication may be necessary.  This skin condition is not highly contagious. Special care is not needed to protect close friends and family members. Normal hygiene is usually enough. Follow up is required only if you develop complications (such as a  secondary infection from scratching), if recommended by your caregiver, or if no relief is obtained from the preparations used.  Document Released: 02/11/2000 Document Revised: 05/08/2011 Document Reviewed: 03/25/2008  [employer] Patient Information 2014 [employer].

## 2013-12-08 ENCOUNTER — Other Ambulatory Visit: Payer: Self-pay | Admitting: Nurse Practitioner

## 2013-12-29 ENCOUNTER — Encounter: Payer: Self-pay | Admitting: Physician Assistant

## 2014-01-03 ENCOUNTER — Encounter (HOSPITAL_COMMUNITY): Payer: Self-pay | Admitting: *Deleted

## 2014-01-03 ENCOUNTER — Inpatient Hospital Stay (HOSPITAL_COMMUNITY)
Admission: AD | Admit: 2014-01-03 | Discharge: 2014-01-03 | Disposition: A | Discharge status: Home / Self Care | Payer: Medicaid Other | Source: Ambulatory Visit | Attending: Obstetrics and Gynecology | Admitting: Obstetrics and Gynecology

## 2014-01-03 ENCOUNTER — Inpatient Hospital Stay (HOSPITAL_COMMUNITY): Payer: Medicaid Other

## 2014-01-03 DIAGNOSIS — F17210 Nicotine dependence, cigarettes, uncomplicated: Secondary | ICD-10-CM | POA: Insufficient documentation

## 2014-01-03 DIAGNOSIS — R109 Unspecified abdominal pain: Secondary | ICD-10-CM

## 2014-01-03 DIAGNOSIS — N926 Irregular menstruation, unspecified: Secondary | ICD-10-CM | POA: Insufficient documentation

## 2014-01-03 LAB — COMPREHENSIVE METABOLIC PANEL
ALT: 18 U/L (ref 0–35)
AST: 16 U/L (ref 0–37)
Albumin: 4.2 g/dL (ref 3.5–5.2)
Alkaline Phosphatase: 59 U/L (ref 39–117)
Anion gap: 13 (ref 5–15)
BILIRUBIN TOTAL: 0.3 mg/dL (ref 0.3–1.2)
BUN: 11 mg/dL (ref 6–23)
CALCIUM: 9.3 mg/dL (ref 8.4–10.5)
CO2: 25 meq/L (ref 19–32)
Chloride: 102 mEq/L (ref 96–112)
Creatinine, Ser: 0.79 mg/dL (ref 0.50–1.10)
GFR calc non Af Amer: >90 mL/min (ref >90)
GLUCOSE: 88 mg/dL (ref 70–99)
Potassium: 4.1 mEq/L (ref 3.7–5.3)
SODIUM: 140 meq/L (ref 137–147)
Total Protein: 7.4 g/dL (ref 6.0–8.3)

## 2014-01-03 LAB — URINALYSIS, ROUTINE W REFLEX MICROSCOPIC
Bilirubin Urine: NEGATIVE
Glucose, UA: NEGATIVE mg/dL
Ketones, ur: NEGATIVE mg/dL
Leukocytes, UA: NEGATIVE
Nitrite: NEGATIVE
PROTEIN: NEGATIVE mg/dL
Specific Gravity, Urine: 1.01 (ref 1.005–1.030)
UROBILINOGEN UA: 0.2 mg/dL (ref 0.0–1.0)
pH: 7.5 (ref 5.0–8.0)

## 2014-01-03 LAB — WET PREP, GENITAL
Clue Cells Wet Prep HPF POC: NONE SEEN
TRICH WET PREP: NONE SEEN
Yeast Wet Prep HPF POC: NONE SEEN

## 2014-01-03 LAB — CBC
HCT: 42.9 % (ref 36.0–46.0)
Hemoglobin: 14.6 g/dL (ref 12.0–15.0)
MCH: 30.2 pg (ref 26.0–34.0)
MCHC: 34 g/dL (ref 30.0–36.0)
MCV: 88.8 fL (ref 78.0–100.0)
Platelets: 233 10*3/uL (ref 150–400)
RBC: 4.83 MIL/uL (ref 3.87–5.11)
RDW: 12.9 % (ref 11.5–15.5)
WBC: 8 10*3/uL (ref 4.0–10.5)

## 2014-01-03 LAB — URINE MICROSCOPIC-ADD ON

## 2014-01-03 LAB — POCT PREGNANCY, URINE: Preg Test, Ur: NEGATIVE

## 2014-01-03 MED ORDER — ONDANSETRON 8 MG PO TBDP
8.0000 mg | ORAL_TABLET | Freq: Once | ORAL | Status: AC
  Administered 2014-01-03: 8 mg via ORAL
  Filled 2014-01-03: qty 1

## 2014-01-03 MED ORDER — KETOROLAC TROMETHAMINE 60 MG/2ML IM SOLN
60.0000 mg | Freq: Once | INTRAMUSCULAR | Status: AC
  Administered 2014-01-03: 60 mg via INTRAMUSCULAR
  Filled 2014-01-03: qty 2

## 2014-01-03 NOTE — MAU Provider Note (Signed)
History     CSN: [id number]  Arrival date and time: 01/03/14 1114   None     Chief Complaint  Patient presents with  . Nausea  . Abdominal Pain   HPI  Pt is not pregnant and presents with left lower abdominal pain for 2 weeks- warm burning sensation in lower abdomen- feels like vagina is going to fall out.  Pt had similar pain 2 years ago when she had a hemorrhagic cyst and several years prior with another cyst- pt is on Nexplano has not had period in 4 years.  Pt has had same partner for 10 years.  Pt has hx of appendectomy. Pt radiates down her leg.  Sex is not painful but has intense postcoital pain. Pt has been taking Excedrin and alternating with Ibuprofen.   Pt has had spotting with wiping.  Not UTI sx- no fever- has had some nausea. Pt took Ibuprofen at 8 am- had 1/2 cup coffee and muffin.  Pt denies constipation. Pt is on zoloft.   Pt is pt of [facility]- had last visit in January with isnertion of Implanon Pt also states that she has frequent urination voiding large amounts and mouth is dry but not thirsty- no change in medications   AM    Expand All Collapse All   Pt presents to MAU with complaints of vaginal bleeding mostly when she wipes, states pain with intercourse. Had new implannon Placed in January       Past Medical History  Diagnosis Date  . Irregular heart beat     Past Surgical History  Procedure Laterality Date  . Appendectomy    . Dilation and curettage of uterus      Family History  Problem Relation Age of Onset  . Cancer Maternal Grandmother 21    ovarian, and uterine  . Cancer Mother 20    ovarian and Breast    History  Substance Use Topics  . Smoking status: Current Every Day Smoker -- 0.25 packs/day    Types: Cigarettes    Last Attempt to Quit: 01/15/2013  . Smokeless tobacco: Never Used  . Alcohol Use: No    Allergies:  Allergies  Allergen Reactions  . Amoxicillin   . Effexor [Venlafaxine]     Prescriptions prior to  admission  Medication Sig Dispense Refill Last Dose  . aspirin-acetaminophen-caffeine (EXCEDRIN MIGRAINE) 250-250-65 MG per tablet Take 1 tablet by mouth every 6 (six) hours as needed for headache.   01/03/2014 at Unknown time  . etonogestrel (NEXPLANON) 68 MG IMPL implant 1 each by Subdermal route once.     Marland Kitchen ibuprofen (ADVIL,MOTRIN) 200 MG tablet Take 800 mg by mouth every 6 (six) hours as needed.   01/03/2014 at Unknown time  . Pediatric Multiple Vit-C-FA (PEDIATRIC MULTIVITAMIN) chewable tablet Chew 1 tablet by mouth daily.   01/02/2014 at Unknown time  . sertraline (ZOLOFT) 100 MG tablet TAKE ONE TABLET BY MOUTH ONCE DAILY 30 tablet 0 01/02/2014 at Unknown time  . fluconazole (DIFLUCAN) 150 MG tablet Take 1 tablet (150 mg total) by mouth once a week. (Patient not taking: Reported on 01/03/2014) 4 tablet 0 Taking  . KETOCONAZOLE, TOPICAL, 1 % SHAM Apply to affected areas daily for 14 days and then as needed thereafter. 200 mL 1 more than month    ROS Physical Exam   Blood pressure 127/85, pulse 88, temperature 98 F (36.7 C), resp. rate 18, last menstrual period 01/03/2014.  Physical Exam  Nursing note and vitals reviewed.  Constitutional: She is oriented to person, place, and time. She appears well-developed and well-nourished. No distress.  Uncomfortable appearing  HENT:  Head: Normocephalic.  Eyes: Pupils are equal, round, and reactive to light.  Neck: Normal range of motion. Neck supple.  Cardiovascular: Normal rate.   Respiratory: Effort normal.  GI: Soft. She exhibits no distension. There is tenderness. There is guarding. There is no rebound.  Genitourinary:  Small amount of bright red blood in vault; cervix clean, uterus NSSC NT; left lower quadrant tenderness with palpation- no rebound- no appreciable enlargement; right adnexa without palpable enlargement- mild tenderness  Musculoskeletal: Normal range of motion.  Neurological: She is alert and oriented to person, place, and  time.  Skin: Skin is warm and dry.  Psychiatric: She has a normal mood and affect.    MAU Course  Procedures Results for orders placed or performed during the hospital encounter of 01/03/14 (from the past 24 hour(s))  Urinalysis, Routine w reflex microscopic     Status: Abnormal   Collection Time: 01/03/14 11:23 AM  Result Value Ref Range   Color, Urine YELLOW YELLOW   APPearance CLEAR CLEAR   Specific Gravity, Urine 1.010 1.005 - 1.030   pH 7.5 5.0 - 8.0   Glucose, UA NEGATIVE NEGATIVE mg/dL   Hgb urine dipstick LARGE (A) NEGATIVE   Bilirubin Urine NEGATIVE NEGATIVE   Ketones, ur NEGATIVE NEGATIVE mg/dL   Protein, ur NEGATIVE NEGATIVE mg/dL   Urobilinogen, UA 0.2 0.0 - 1.0 mg/dL   Nitrite NEGATIVE NEGATIVE   Leukocytes, UA NEGATIVE NEGATIVE  Urine microscopic-add on     Status: None   Collection Time: 01/03/14 11:23 AM  Result Value Ref Range   WBC, UA 0-2 <3 WBC/hpf   RBC / HPF 0-2 <3 RBC/hpf  Pregnancy, urine POC     Status: None   Collection Time: 01/03/14 11:37 AM  Result Value Ref Range   Preg Test, Ur NEGATIVE NEGATIVE  CBC     Status: None   Collection Time: 01/03/14  1:45 PM  Result Value Ref Range   WBC 8.0 4.0 - 10.5 K/uL   RBC 4.83 3.87 - 5.11 MIL/uL   Hemoglobin 14.6 12.0 - 15.0 g/dL   HCT 42.9 36.0 - 46.0 %   MCV 88.8 78.0 - 100.0 fL   MCH 30.2 26.0 - 34.0 pg   MCHC 34.0 30.0 - 36.0 g/dL   RDW 12.9 11.5 - 15.5 %   Platelets 233 150 - 400 K/uL  Comprehensive metabolic panel     Status: None   Collection Time: 01/03/14  1:45 PM  Result Value Ref Range   Sodium 140 137 - 147 mEq/L   Potassium 4.1 3.7 - 5.3 mEq/L   Chloride 102 96 - 112 mEq/L   CO2 25 19 - 32 mEq/L   Glucose, Bld 88 70 - 99 mg/dL   BUN 11 6 - 23 mg/dL   Creatinine, Ser 0.79 0.50 - 1.10 mg/dL   Calcium 9.3 8.4 - 10.5 mg/dL   Total Protein 7.4 6.0 - 8.3 g/dL   Albumin 4.2 3.5 - 5.2 g/dL   AST 16 0 - 37 U/L   ALT 18 0 - 35 U/L   Alkaline Phosphatase 59 39 - 117 U/L   Total Bilirubin  0.3 0.3 - 1.2 mg/dL   GFR calc non Af Amer >90 >90 mL/min   GFR calc Af Amer >90 >90 mL/min   Anion gap 13 5 - 15  Toradol 60mg  IM zofran 8  mg ODT for nausea GC/Chlamydia pending Results for orders placed or performed during the hospital encounter of 01/03/14 (from the past 24 hour(s))  Urinalysis, Routine w reflex microscopic     Status: Abnormal   Collection Time: 01/03/14 11:23 AM  Result Value Ref Range   Color, Urine YELLOW YELLOW   APPearance CLEAR CLEAR   Specific Gravity, Urine 1.010 1.005 - 1.030   pH 7.5 5.0 - 8.0   Glucose, UA NEGATIVE NEGATIVE mg/dL   Hgb urine dipstick LARGE (A) NEGATIVE   Bilirubin Urine NEGATIVE NEGATIVE   Ketones, ur NEGATIVE NEGATIVE mg/dL   Protein, ur NEGATIVE NEGATIVE mg/dL   Urobilinogen, UA 0.2 0.0 - 1.0 mg/dL   Nitrite NEGATIVE NEGATIVE   Leukocytes, UA NEGATIVE NEGATIVE  Urine microscopic-add on     Status: None   Collection Time: 01/03/14 11:23 AM  Result Value Ref Range   WBC, UA 0-2 <3 WBC/hpf   RBC / HPF 0-2 <3 RBC/hpf  Pregnancy, urine POC     Status: None   Collection Time: 01/03/14 11:37 AM  Result Value Ref Range   Preg Test, Ur NEGATIVE NEGATIVE  CBC     Status: None   Collection Time: 01/03/14  1:45 PM  Result Value Ref Range   WBC 8.0 4.0 - 10.5 K/uL   RBC 4.83 3.87 - 5.11 MIL/uL   Hemoglobin 14.6 12.0 - 15.0 g/dL   HCT 42.9 36.0 - 46.0 %   MCV 88.8 78.0 - 100.0 fL   MCH 30.2 26.0 - 34.0 pg   MCHC 34.0 30.0 - 36.0 g/dL   RDW 12.9 11.5 - 15.5 %   Platelets 233 150 - 400 K/uL  Comprehensive metabolic panel     Status: None   Collection Time: 01/03/14  1:45 PM  Result Value Ref Range   Sodium 140 137 - 147 mEq/L   Potassium 4.1 3.7 - 5.3 mEq/L   Chloride 102 96 - 112 mEq/L   CO2 25 19 - 32 mEq/L   Glucose, Bld 88 70 - 99 mg/dL   BUN 11 6 - 23 mg/dL   Creatinine, Ser 0.79 0.50 - 1.10 mg/dL   Calcium 9.3 8.4 - 10.5 mg/dL   Total Protein 7.4 6.0 - 8.3 g/dL   Albumin 4.2 3.5 - 5.2 g/dL   AST 16 0 - 37 U/L   ALT 18  0 - 35 U/L   Alkaline Phosphatase 59 39 - 117 U/L   Total Bilirubin 0.3 0.3 - 1.2 mg/dL   GFR calc non Af Amer >90 >90 mL/min   GFR calc Af Amer >90 >90 mL/min   Anion gap 13 5 - 15  Wet prep, genital     Status: Abnormal   Collection Time: 01/03/14  2:44 PM  Result Value Ref Range   Yeast Wet Prep HPF POC NONE SEEN NONE SEEN   Trich, Wet Prep NONE SEEN NONE SEEN   Clue Cells Wet Prep HPF POC NONE SEEN NONE SEEN   WBC, Wet Prep HPF POC FEW (A) NONE SEEN   US Transvaginal Non-ob  01/03/2014   CLINICAL DATA:  33 year old female with painful intercourse an intermittent spotting since 10/24  EXAM: TRANSABDOMINAL AND TRANSVAGINAL ULTRASOUND OF PELVIS  TECHNIQUE: Both transabdominal and transvaginal ultrasound examinations of the pelvis were performed. Transabdominal technique was performed for global imaging of the pelvis including uterus, ovaries, adnexal regions, and pelvic cul-de-sac. It was necessary to proceed with endovaginal exam following the transabdominal exam to visualize the bilateral adnexa.  COMPARISON:  Prior abdominal alters facet Prior pelvic ultrasound 12/30/2006  FINDINGS: Uterus  Measurements: 7.9 x 3.5 x 4.4 cm. No fibroids or other mass visualized.  Endometrium  Thickness: 5 mm.  No focal abnormality visualized.  Right ovary  Measurements: 2.7 x 2.8 x 2.1 cm. Normal appearance/no adnexal mass.  Left ovary  Measurements: 2.9 x 2.3 x 1.8 cm. 1.9 x 0.8 x 1.5 cm crenated hypoechoic cystic structure within the ovary likely representative of recent ovulation or recently ruptured follicular cyst. No evidence of hemorrhage, mural nodularity or other complicating feature.  Other findings  No free fluid.  IMPRESSION: Unremarkable pelvic ultrasound.   Electronically Signed   By: Jacqulynn Cadet M.D.   On: 01/03/2014 15:41   US Pelvis Complete  01/03/2014   CLINICAL DATA:  33 year old female with painful intercourse an intermittent spotting since 10/24  EXAM: TRANSABDOMINAL AND TRANSVAGINAL  ULTRASOUND OF PELVIS  TECHNIQUE: Both transabdominal and transvaginal ultrasound examinations of the pelvis were performed. Transabdominal technique was performed for global imaging of the pelvis including uterus, ovaries, adnexal regions, and pelvic cul-de-sac. It was necessary to proceed with endovaginal exam following the transabdominal exam to visualize the bilateral adnexa.  COMPARISON:  Prior abdominal alters facet Prior pelvic ultrasound 12/30/2006  FINDINGS: Uterus  Measurements: 7.9 x 3.5 x 4.4 cm. No fibroids or other mass visualized.  Endometrium  Thickness: 5 mm.  No focal abnormality visualized.  Right ovary  Measurements: 2.7 x 2.8 x 2.1 cm. Normal appearance/no adnexal mass.  Left ovary  Measurements: 2.9 x 2.3 x 1.8 cm. 1.9 x 0.8 x 1.5 cm crenated hypoechoic cystic structure within the ovary likely representative of recent ovulation or recently ruptured follicular cyst. No evidence of hemorrhage, mural nodularity or other complicating feature.  Other findings  No free fluid.  IMPRESSION: Unremarkable pelvic ultrasound.   Electronically Signed   By: Jacqulynn Cadet M.D.   On: 01/03/2014 15:41   Assessment and Plan  abd pain- normal ultrasound F/u with [facility]  Erica [name] 01/03/2014, 1:21 PM

## 2014-01-03 NOTE — MAU Note (Signed)
Pt presents to MAU with complaints of vaginal bleeding mostly when she wipes, states pain with intercourse. Had new implannon  Placed in January

## 2014-01-05 LAB — HIV ANTIBODY (ROUTINE TESTING W REFLEX): HIV: NONREACTIVE

## 2014-01-05 LAB — GC/CHLAMYDIA PROBE AMP
CT Probe RNA: NEGATIVE
GC PROBE AMP APTIMA: NEGATIVE

## 2014-01-06 ENCOUNTER — Telehealth: Payer: Self-pay | Admitting: Nurse Practitioner

## 2014-01-06 MED ORDER — SERTRALINE HCL 100 MG PO TABS: ORAL_TABLET | ORAL | Status: DC

## 2014-01-06 NOTE — Telephone Encounter (Signed)
Done

## 2014-01-29 ENCOUNTER — Encounter: Payer: Self-pay | Admitting: *Deleted

## 2014-03-04 ENCOUNTER — Ambulatory Visit (INDEPENDENT_AMBULATORY_CARE_PROVIDER_SITE_OTHER): Payer: Medicaid Other | Admitting: Nurse Practitioner

## 2014-03-04 ENCOUNTER — Encounter: Payer: Self-pay | Admitting: Nurse Practitioner

## 2014-03-04 ENCOUNTER — Ambulatory Visit (INDEPENDENT_AMBULATORY_CARE_PROVIDER_SITE_OTHER): Payer: Medicaid Other | Admitting: *Deleted

## 2014-03-04 VITALS — BP 114/80 | HR 88 | Temp 97.3°F | Ht 69.0 in | Wt 196.0 lb

## 2014-03-04 DIAGNOSIS — F429 Obsessive-compulsive disorder, unspecified: Secondary | ICD-10-CM

## 2014-03-04 DIAGNOSIS — F42 Obsessive-compulsive disorder: Secondary | ICD-10-CM

## 2014-03-04 DIAGNOSIS — F411 Generalized anxiety disorder: Secondary | ICD-10-CM

## 2014-03-04 DIAGNOSIS — Z23 Encounter for immunization: Secondary | ICD-10-CM

## 2014-03-04 MED ORDER — SERTRALINE HCL 100 MG PO TABS: ORAL_TABLET | ORAL | Status: DC

## 2014-03-04 NOTE — Patient Instructions (Signed)
Stress and Stress Management Stress is a normal reaction to life events. It is what you feel when life demands more than you are used to or more than you can handle. Some stress can be useful. For example, the stress reaction can help you catch the last bus of the day, study for a test, or meet a deadline at work. But stress that occurs too often or for too long can cause problems. It can affect your emotional health and interfere with relationships and normal daily activities. Too much stress can weaken your immune system and increase your risk for physical illness. If you already have a medical problem, stress can make it worse. CAUSES  All sorts of life events may cause stress. An event that causes stress for one person may not be stressful for another person. Major life events commonly cause stress. These may be positive or negative. Examples include losing your job, moving into a new home, getting married, having a baby, or losing a loved one. Less obvious life events may also cause stress, especially if they occur day after day or in combination. Examples include working long hours, driving in traffic, caring for children, being in debt, or being in a difficult relationship. SIGNS AND SYMPTOMS Stress may cause emotional symptoms including, the following:  Anxiety. This is feeling worried, afraid, on edge, overwhelmed, or out of control.  Anger. This is feeling irritated or impatient.  Depression. This is feeling sad, down, helpless, or guilty.  Difficulty focusing, remembering, or making decisions. Stress may cause physical symptoms, including the following:   Aches and pains. These may affect your head, neck, back, stomach, or other areas of your body.  Tight muscles or clenched jaw.  Low energy or trouble sleeping. Stress may cause unhealthy behaviors, including the following:   Eating to feel better (overeating) or skipping meals.  Sleeping too little, too much, or both.  Working  too much or putting off tasks (procrastination).  Smoking, drinking alcohol, or using drugs to feel better. DIAGNOSIS  Stress is diagnosed through an assessment by your health care provider. Your health care provider will ask questions about your symptoms and any stressful life events.Your health care provider will also ask about your medical history and may order blood tests or other tests. Certain medical conditions and medicine can cause physical symptoms similar to stress. Mental illness can cause emotional symptoms and unhealthy behaviors similar to stress. Your health care provider may refer you to a mental health professional for further evaluation.  TREATMENT  Stress management is the recommended treatment for stress.The goals of stress management are reducing stressful life events and coping with stress in healthy ways.  Techniques for reducing stressful life events include the following:  Stress identification. Self-monitor for stress and identify what causes stress for you. These skills may help you to avoid some stressful events.  Time management. Set your priorities, keep a calendar of events, and learn to say "no." These tools can help you avoid making too many commitments. Techniques for coping with stress include the following:  Rethinking the problem. Try to think realistically about stressful events rather than ignoring them or overreacting. Try to find the positives in a stressful situation rather than focusing on the negatives.  Exercise. Physical exercise can release both physical and emotional tension. The key is to find a form of exercise you enjoy and do it regularly.  Relaxation techniques. These relax the body and mind. Examples include yoga, meditation, tai chi, biofeedback, deep  breathing, progressive muscle relaxation, listening to music, being out in nature, journaling, and other hobbies. Again, the key is to find one or more that you enjoy and can do  regularly.  Healthy lifestyle. Eat a balanced diet, get plenty of sleep, and do not smoke. Avoid using alcohol or drugs to relax.  Strong support network. Spend time with family, friends, or other people you enjoy being around.Express your feelings and talk things over with someone you trust. Counseling or talktherapy with a mental health professional may be helpful if you are having difficulty managing stress on your own. Medicine is typically not recommended for the treatment of stress.Talk to your health care provider if you think you need medicine for symptoms of stress. HOME CARE INSTRUCTIONS  Keep all follow-up visits as directed by your health care provider.  Take all medicines as directed by your health care provider. SEEK MEDICAL CARE IF:  Your symptoms get worse or you start having new symptoms.  You feel overwhelmed by your problems and can no longer manage them on your own. SEEK IMMEDIATE MEDICAL CARE IF:  You feel like hurting yourself or someone else. Document Released: 08/09/2000 Document Revised: 06/30/2013 Document Reviewed: 10/08/2012 [employer] Patient Information 2015 [employer]. This information is not intended to replace advice given to you by your health care provider. Make sure you discuss any questions you have with your health care provider.  

## 2014-03-04 NOTE — Progress Notes (Signed)
   Subjective:    Patient ID: Erica [name], female    DOB: 07-Oct-1980, 34 y.o.   MRN: [id number]  HPI Patient here today for follow up of OCD- She has been on zoloft for awhile and seems to help her a lot-  Says that she is starting to have increasing episodes of OCD- would like to increase dose-she denies any medication side effects.    Review of Systems  Constitutional: Negative.   HENT: Negative.   Respiratory: Negative.   Cardiovascular: Negative.   Gastrointestinal: Negative.   Genitourinary: Negative.   Neurological: Negative.   Psychiatric/Behavioral: Negative.   All other systems reviewed and are negative.      Objective:   Physical Exam  Constitutional: She is oriented to person, place, and time. She appears well-developed and well-nourished.  Cardiovascular: Normal rate, regular rhythm and normal heart sounds.   Pulmonary/Chest: Effort normal and breath sounds normal.  Neurological: She is alert and oriented to person, place, and time.  Skin: Skin is warm and dry.  Psychiatric: She has a normal mood and affect. Her behavior is normal. Judgment and thought content normal.   BP 114/80 mmHg  Pulse 88  Temp(Src) 97.3 F (36.3 C) (Oral)  Ht 5\' 9"  (1.753 m)  Wt 196 lb (88.905 kg)  BMI 28.93 kg/m2        Assessment & Plan:   1. OCD (obsessive compulsive disorder)   2. GAD (generalized anxiety disorder)   flu shot today Increase zoloft to 1 1/2 tab daily Stress management RTO in 6 months  Mary-Margaret Hassell Done, FNP

## 2014-05-12 ENCOUNTER — Ambulatory Visit (INDEPENDENT_AMBULATORY_CARE_PROVIDER_SITE_OTHER): Payer: Medicaid Other | Admitting: Nurse Practitioner

## 2014-05-12 ENCOUNTER — Encounter: Payer: Self-pay | Admitting: Nurse Practitioner

## 2014-05-12 VITALS — BP 116/79 | HR 101 | Temp 97.7°F | Ht 69.0 in | Wt 200.0 lb

## 2014-05-12 DIAGNOSIS — Z01419 Encounter for gynecological examination (general) (routine) without abnormal findings: Secondary | ICD-10-CM | POA: Diagnosis not present

## 2014-05-12 DIAGNOSIS — Z0000 Encounter for general adult medical examination without abnormal findings: Secondary | ICD-10-CM | POA: Diagnosis not present

## 2014-05-12 LAB — POCT URINALYSIS DIPSTICK
Bilirubin, UA: NEGATIVE
GLUCOSE UA: NEGATIVE
Ketones, UA: NEGATIVE
LEUKOCYTES UA: NEGATIVE
NITRITE UA: NEGATIVE
PROTEIN UA: NEGATIVE
Urobilinogen, UA: NEGATIVE
pH, UA: 6

## 2014-05-12 LAB — POCT CBC
Granulocyte percent: 63.3 %G (ref 37–80)
HEMATOCRIT: 44.9 % (ref 37.7–47.9)
Hemoglobin: 14.2 g/dL (ref 12.2–16.2)
Lymph, poc: 3.2 (ref 0.6–3.4)
MCH, POC: 28.2 pg (ref 27–31.2)
MCHC: 31.6 g/dL — AB (ref 31.8–35.4)
MCV: 89.4 fL (ref 80–97)
MPV: 7.3 fL (ref 0–99.8)
PLATELET COUNT, POC: 279 10*3/uL (ref 142–424)
POC GRANULOCYTE: 6.8 (ref 2–6.9)
POC LYMPH PERCENT: 30 %L (ref 10–50)
RBC: 5.02 M/uL (ref 4.04–5.48)
RDW, POC: 13.2 %
WBC: 10.7 10*3/uL — AB (ref 4.6–10.2)

## 2014-05-12 LAB — POCT UA - MICROSCOPIC ONLY
CASTS, UR, LPF, POC: NEGATIVE
Crystals, Ur, HPF, POC: NEGATIVE
RBC, urine, microscopic: NEGATIVE
YEAST UA: NEGATIVE

## 2014-05-12 NOTE — Patient Instructions (Signed)
Exercise to Stay Healthy Exercise helps you become and stay healthy. EXERCISE IDEAS AND TIPS Choose exercises that:  You enjoy.  Fit into your day. You do not need to exercise really hard to be healthy. You can do exercises at a slow or medium level and stay healthy. You can:  Stretch before and after working out.  Try yoga, Pilates, or tai chi.  Lift weights.  Walk fast, swim, jog, run, climb stairs, bicycle, dance, or rollerskate.  Take aerobic classes. Exercises that burn about 150 calories:  Running 1  miles in 15 minutes.  Playing volleyball for 45 to 60 minutes.  Washing and waxing a car for 45 to 60 minutes.  Playing touch football for 45 minutes.  Walking 1  miles in 35 minutes.  Pushing a stroller 1  miles in 30 minutes.  Playing basketball for 30 minutes.  Raking leaves for 30 minutes.  Bicycling 5 miles in 30 minutes.  Walking 2 miles in 30 minutes.  Dancing for 30 minutes.  Shoveling snow for 15 minutes.  Swimming laps for 20 minutes.  Walking up stairs for 15 minutes.  Bicycling 4 miles in 15 minutes.  Gardening for 30 to 45 minutes.  Jumping rope for 15 minutes.  Washing windows or floors for 45 to 60 minutes. Document Released: 03/18/2010 Document Revised: 05/08/2011 Document Reviewed: 03/18/2010 [employer] Patient Information 2015 [employer]. This information is not intended to replace advice given to you by your health care provider. Make sure you discuss any questions you have with your health care provider.  

## 2014-05-12 NOTE — Addendum Note (Signed)
Addended by: Earlene Plater on: 05/12/2014 04:58 PM   Modules accepted: Miquel Dunn

## 2014-05-12 NOTE — Progress Notes (Signed)
   Subjective:    Patient ID: SELMA MINK, female    DOB: 03-14-1980, 34 y.o.   MRN: [id number]  HPI The patient presents today for a wellness physical with pap. She feels generally well, but states she has gained weight over the past several years and has been watching her diet and exercising to lose weight without much success.   GAD/OCD-the patient is taking sertraline 200 mg which has helped significantly with OCD, still occasionally has some anxiety.    Review of Systems  Constitutional: Negative.  Negative for activity change and appetite change.  Respiratory: Negative.   Cardiovascular: Negative.   Gastrointestinal: Negative.   Neurological: Negative.   Psychiatric/Behavioral: Negative.   All other systems reviewed and are negative.       Objective:   Physical Exam  Constitutional: She is oriented to person, place, and time. She appears well-developed and well-nourished.  HENT:  Head: Normocephalic.  Right Ear: Hearing, tympanic membrane, external ear and ear canal normal.  Left Ear: Hearing, tympanic membrane, external ear and ear canal normal.  Nose: Nose normal.  Mouth/Throat: Uvula is midline and oropharynx is clear and moist.  Eyes: Conjunctivae and EOM are normal. Pupils are equal, round, and reactive to light.  Neck: Normal range of motion and full passive range of motion without pain. Neck supple. No JVD present. Carotid bruit is not present. No thyroid mass and no thyromegaly present.  Cardiovascular: Normal rate, normal heart sounds and intact distal pulses.   No murmur heard. Pulmonary/Chest: Effort normal and breath sounds normal. Right breast exhibits no inverted nipple, no mass, no nipple discharge, no skin change and no tenderness. Left breast exhibits no inverted nipple, no mass, no nipple discharge, no skin change and no tenderness.  Abdominal: Soft. Bowel sounds are normal. She exhibits no mass. There is no tenderness.  Genitourinary: Vagina normal and  uterus normal. No breast swelling, tenderness, discharge or bleeding.  bimanual exam-No adnexal masses or tenderness. Cervix parous and pink -= no discharge  Musculoskeletal: Normal range of motion.  Lymphadenopathy:    She has no cervical adenopathy.  Neurological: She is alert and oriented to person, place, and time.  Skin: Skin is warm and dry.  Psychiatric: She has a normal mood and affect. Her behavior is normal. Judgment and thought content normal.  Vitals reviewed.  BP 116/79 mmHg  Pulse 101  Temp(Src) 97.7 F (36.5 C) (Oral)  Ht $R'5\' 9"'uP$  (1.753 m)  Wt 200 lb (90.719 kg)  BMI 29.52 kg/m2        Assessment & Plan:  1. Annual physical exam - POCT urinalysis dipstick - POCT UA - Microscopic Only - Pap IG w/ reflex to HPV when ASC-U  2. Encounter for routine gynecological examination - POCT CBC - CMP14+EGFR - NMR, lipoprofile - Thyroid Panel With TSH - Vit D  25 hydroxy (rtn osteoporosis monitoring)  SMOKING CESSATION encouraged Follow  Up in 6 months Td given  Mary-Margaret Hassell Done, FNP

## 2014-05-13 LAB — CMP14+EGFR
ALK PHOS: 56 IU/L (ref 39–117)
ALT: 14 IU/L (ref 0–32)
AST: 10 IU/L (ref 0–40)
Albumin/Globulin Ratio: 1.7 (ref 1.1–2.5)
Albumin: 4.5 g/dL (ref 3.5–5.5)
BUN / CREAT RATIO: 16 (ref 8–20)
BUN: 14 mg/dL (ref 6–20)
CHLORIDE: 99 mmol/L (ref 97–108)
CO2: 25 mmol/L (ref 18–29)
CREATININE: 0.88 mg/dL (ref 0.57–1.00)
Calcium: 9.6 mg/dL (ref 8.7–10.2)
GFR calc Af Amer: 100 mL/min/{1.73_m2} (ref >59)
GFR calc non Af Amer: 87 mL/min/{1.73_m2} (ref >59)
Globulin, Total: 2.7 g/dL (ref 1.5–4.5)
Glucose: 81 mg/dL (ref 65–99)
Potassium: 3.9 mmol/L (ref 3.5–5.2)
SODIUM: 140 mmol/L (ref 134–144)
TOTAL PROTEIN: 7.2 g/dL (ref 6.0–8.5)

## 2014-05-13 LAB — NMR, LIPOPROFILE
Cholesterol: 222 mg/dL — ABNORMAL HIGH (ref 100–199)
HDL Cholesterol by NMR: 29 mg/dL — ABNORMAL LOW (ref >39)
HDL Particle Number: 27.8 umol/L — ABNORMAL LOW (ref >=30.5)
LDL PARTICLE NUMBER: 2393 nmol/L — AB (ref <1000)
LDL SIZE: 20.2 nm (ref >20.5)
LDL-C: 141 mg/dL — ABNORMAL HIGH (ref 0–99)
LP-IR Score: 84 — ABNORMAL HIGH (ref <=45)
Small LDL Particle Number: 1499 nmol/L — ABNORMAL HIGH (ref <=527)
Triglycerides by NMR: 260 mg/dL — ABNORMAL HIGH (ref 0–149)

## 2014-05-13 LAB — THYROID PANEL WITH TSH
Free Thyroxine Index: 1.7 (ref 1.2–4.9)
T3 UPTAKE RATIO: 27 % (ref 24–39)
T4, Total: 6.3 ug/dL (ref 4.5–12.0)
TSH: 2.09 u[IU]/mL (ref 0.450–4.500)

## 2014-05-13 LAB — VITAMIN D 25 HYDROXY (VIT D DEFICIENCY, FRACTURES): VIT D 25 HYDROXY: 11 ng/mL — AB (ref 30.0–100.0)

## 2014-05-14 LAB — PAP IG W/ RFLX HPV ASCU: PAP SMEAR COMMENT: 0

## 2014-07-06 ENCOUNTER — Other Ambulatory Visit: Payer: Self-pay | Admitting: Nurse Practitioner

## 2014-07-06 DIAGNOSIS — F429 Obsessive-compulsive disorder, unspecified: Secondary | ICD-10-CM

## 2014-07-06 MED ORDER — SERTRALINE HCL 100 MG PO TABS: ORAL_TABLET | ORAL | Status: DC

## 2014-07-06 NOTE — Telephone Encounter (Signed)
zoloft rx sent to pharamacy

## 2014-07-06 NOTE — Telephone Encounter (Signed)
Patient aware.

## 2014-07-15 ENCOUNTER — Ambulatory Visit (INDEPENDENT_AMBULATORY_CARE_PROVIDER_SITE_OTHER): Payer: Medicaid Other | Admitting: Family

## 2014-07-15 ENCOUNTER — Encounter: Payer: Self-pay | Admitting: Family

## 2014-07-15 VITALS — BP 109/77 | HR 90 | Temp 98.4°F | Ht 69.0 in | Wt 198.4 lb

## 2014-07-15 DIAGNOSIS — M5430 Sciatica, unspecified side: Secondary | ICD-10-CM

## 2014-07-15 DIAGNOSIS — M5412 Radiculopathy, cervical region: Secondary | ICD-10-CM

## 2014-07-15 MED ORDER — CYCLOBENZAPRINE HCL 10 MG PO TABS: 10.0000 mg | ORAL_TABLET | Freq: Three times a day (TID) | ORAL | Status: DC | PRN

## 2014-07-15 MED ORDER — METHYLPREDNISOLONE ACETATE 80 MG/ML IJ SUSP
80.0000 mg | Freq: Once | INTRAMUSCULAR | Status: AC
  Administered 2014-07-15: 80 mg via INTRAMUSCULAR

## 2014-07-15 MED ORDER — KETOROLAC TROMETHAMINE 60 MG/2ML IM SOLN
60.0000 mg | Freq: Once | INTRAMUSCULAR | Status: AC
  Administered 2014-07-15: 60 mg via INTRAMUSCULAR

## 2014-07-15 MED ORDER — MELOXICAM 15 MG PO TABS: 15.0000 mg | ORAL_TABLET | Freq: Every day | ORAL | Status: DC

## 2014-07-15 NOTE — Patient Instructions (Signed)
Sciatica Sciatica is pain, weakness, numbness, or tingling along the path of the sciatic nerve. The nerve starts in the lower back and runs down the back of each leg. The nerve controls the muscles in the lower leg and in the back of the knee, while also providing sensation to the back of the thigh, lower leg, and the sole of your foot. Sciatica is a symptom of another medical condition. For instance, nerve damage or certain conditions, such as a herniated disk or bone spur on the spine, pinch or put pressure on the sciatic nerve. This causes the pain, weakness, or other sensations normally associated with sciatica. Generally, sciatica only affects one side of the body. CAUSES   Herniated or slipped disc.  Degenerative disk disease.  A pain disorder involving the narrow muscle in the buttocks (piriformis syndrome).  Pelvic injury or fracture.  Pregnancy.  Tumor (rare). SYMPTOMS  Symptoms can vary from mild to very severe. The symptoms usually travel from the low back to the buttocks and down the back of the leg. Symptoms can include:  Mild tingling or dull aches in the lower back, leg, or hip.  Numbness in the back of the calf or sole of the foot.  Burning sensations in the lower back, leg, or hip.  Sharp pains in the lower back, leg, or hip.  Leg weakness.  Severe back pain inhibiting movement. These symptoms may get worse with coughing, sneezing, laughing, or prolonged sitting or standing. Also, being overweight may worsen symptoms. DIAGNOSIS  Your caregiver will perform a physical exam to look for common symptoms of sciatica. He or she may ask you to do certain movements or activities that would trigger sciatic nerve pain. Other tests may be performed to find the cause of the sciatica. These may include:  Blood tests.  X-rays.  Imaging tests, such as an MRI or CT scan. TREATMENT  Treatment is directed at the cause of the sciatic pain. Sometimes, treatment is not necessary  and the pain and discomfort goes away on its own. If treatment is needed, your caregiver may suggest:  Over-the-counter medicines to relieve pain.  Prescription medicines, such as anti-inflammatory medicine, muscle relaxants, or narcotics.  Applying heat or ice to the painful area.  Steroid injections to lessen pain, irritation, and inflammation around the nerve.  Reducing activity during periods of pain.  Exercising and stretching to strengthen your abdomen and improve flexibility of your spine. Your caregiver may suggest losing weight if the extra weight makes the back pain worse.  Physical therapy.  Surgery to eliminate what is pressing or pinching the nerve, such as a bone spur or part of a herniated disk. HOME CARE INSTRUCTIONS   Only take over-the-counter or prescription medicines for pain or discomfort as directed by your caregiver.  Apply ice to the affected area for 20 minutes, 3-4 times a day for the first 48-72 hours. Then try heat in the same way.  Exercise, stretch, or perform your usual activities if these do not aggravate your pain.  Attend physical therapy sessions as directed by your caregiver.  Keep all follow-up appointments as directed by your caregiver.  Do not wear high heels or shoes that do not provide proper support.  Check your mattress to see if it is too soft. A firm mattress may lessen your pain and discomfort. SEEK IMMEDIATE MEDICAL CARE IF:   You lose control of your bowel or bladder (incontinence).  You have increasing weakness in the lower back, pelvis, buttocks,   or legs.  You have redness or swelling of your back.  You have a burning sensation when you urinate.  You have pain that gets worse when you lie down or awakens you at night.  Your pain is worse than you have experienced in the past.  Your pain is lasting longer than 4 weeks.  You are suddenly losing weight without reason. MAKE SURE YOU:  Understand these  instructions.  Will watch your condition.  Will get help right away if you are not doing well or get worse. Document Released: 02/07/2001 Document Revised: 08/15/2011 Document Reviewed: 06/25/2011 [facility] Patient Information 2015 [place], [place]. This information is not intended to replace advice given to you by your health care provider. Make sure you discuss any questions you have with your health care provider. Cervical Radiculopathy Cervical radiculopathy happens when a nerve in the neck is pinched or bruised by a slipped (herniated) disk or by arthritic changes in the bones of the cervical spine. This can occur due to an injury or as part of the normal aging process. Pressure on the cervical nerves can cause pain or numbness that runs from your neck all the way down into your arm and fingers. CAUSES  There are many possible causes, including:  Injury.  Muscle tightness in the neck from overuse.  Swollen, painful joints (arthritis).  Breakdown or degeneration in the bones and joints of the spine (spondylosis) due to aging.  Bone spurs that may develop near the cervical nerves. SYMPTOMS  Symptoms include pain, weakness, or numbness in the affected arm and hand. Pain can be severe or irritating. Symptoms may be worse when extending or turning the neck. DIAGNOSIS  Your caregiver will ask about your symptoms and do a physical exam. He or she may test your strength and reflexes. X-rays, CT scans, and MRI scans may be needed in cases of injury or if the symptoms do not go away after a period of time. Electromyography (EMG) or nerve conduction testing may be done to study how your nerves and muscles are working. TREATMENT  Your caregiver may recommend certain exercises to help relieve your symptoms. Cervical radiculopathy can, and often does, get better with time and treatment. If your problems continue, treatment options may include:  Wearing a soft collar for short periods of  time.  Physical therapy to strengthen the neck muscles.  Medicines, such as nonsteroidal anti-inflammatory drugs (NSAIDs), oral corticosteroids, or spinal injections.  Surgery. Different types of surgery may be done depending on the cause of your problems. HOME CARE INSTRUCTIONS   Put ice on the affected area.  Put ice in a plastic bag.  Place a towel between your skin and the bag.  Leave the ice on for 15-20 minutes, 03-04 times a day or as directed by your caregiver.  If ice does not help, you can try using heat. Take a warm shower or bath, or use a hot water bottle as directed by your caregiver.  You may try a gentle neck and shoulder massage.  Use a flat pillow when you sleep.  Only take over-the-counter or prescription medicines for pain, discomfort, or fever as directed by your caregiver.  If physical therapy was prescribed, follow your caregiver's directions.  If a soft collar was prescribed, use it as directed. SEEK IMMEDIATE MEDICAL CARE IF:   Your pain gets much worse and cannot be controlled with medicines.  You have weakness or numbness in your hand, arm, face, or leg.  You have a high  fever or a stiff, rigid neck.  You lose bowel or bladder control (incontinence).  You have trouble with walking, balance, or speaking. MAKE SURE YOU:   Understand these instructions.  Will watch your condition.  Will get help right away if you are not doing well or get worse. Document Released: 11/08/2000 Document Revised: 05/08/2011 Document Reviewed: 09/27/2010 [facility] Patient Information 2015 [place], [place]. This information is not intended to replace advice given to you by your health care provider. Make sure you discuss any questions you have with your health care provider.

## 2014-07-15 NOTE — Progress Notes (Signed)
Subjective:    Patient ID: Erica [name], female    DOB: 01/07/1981, 34 y.o.   MRN: [id number]  Back Pain This is a new problem. The current episode started more than 1 month ago. The problem occurs constantly. The problem is unchanged. The pain is present in the thoracic spine. The quality of the pain is described as aching. The pain is at a severity of 9/10. The pain is moderate. The symptoms are aggravated by position and sitting. Associated symptoms include headaches, leg pain, numbness (right hand) and tingling. Pertinent negatives include no bladder incontinence, bowel incontinence, dysuria or fever. She has tried muscle relaxant, ice and NSAIDs for the symptoms. The treatment provided mild relief.  Headache  Associated symptoms include back pain, numbness (right hand) and tingling. Pertinent negatives include no fever.      Review of Systems  Constitutional: Negative.  Negative for fever.  Eyes: Negative.   Respiratory: Negative.  Negative for shortness of breath.   Cardiovascular: Negative.  Negative for palpitations.  Gastrointestinal: Negative.  Negative for bowel incontinence.  Endocrine: Negative.   Genitourinary: Negative.  Negative for bladder incontinence and dysuria.  Musculoskeletal: Positive for back pain.  Neurological: Positive for tingling, numbness (right hand) and headaches.  Hematological: Negative.   Psychiatric/Behavioral: Negative.   All other systems reviewed and are negative.      Objective:   Physical Exam  Constitutional: She is oriented to person, place, and time. She appears well-developed and well-nourished. No distress.  HENT:  Head: Normocephalic and atraumatic.  Mouth/Throat: Oropharynx is clear and moist.  Eyes: Pupils are equal, round, and reactive to light.  Neck: Normal range of motion. Neck supple. No thyromegaly present.  Cardiovascular: Normal rate, regular rhythm, normal heart sounds and intact distal pulses.   No murmur  heard. Pulmonary/Chest: Effort normal and breath sounds normal. No respiratory distress. She has no wheezes.  Abdominal: Soft. Bowel sounds are normal. She exhibits no distension. There is no tenderness.  Musculoskeletal: Normal range of motion. She exhibits tenderness (Right shoulder). She exhibits no edema.  Neurological: She is alert and oriented to person, place, and time. She has normal reflexes. No cranial nerve deficit.  Skin: Skin is warm and dry.  Psychiatric: She has a normal mood and affect. Her behavior is normal. Judgment and thought content normal.  Vitals reviewed.     BP 109/77 mmHg  Pulse 90  Temp(Src) 98.4 F (36.9 C) (Oral)  Ht 5\' 9"  (1.753 m)  Wt 198 lb 6.4 oz (89.994 kg)  BMI 29.29 kg/m2     Assessment & Plan:  1. Cervical radiculitis - ketorolac (TORADOL) injection 60 mg; Inject 2 mLs (60 mg total) into the muscle once. - methylPREDNISolone acetate (DEPO-MEDROL) injection 80 mg; Inject 1 mL (80 mg total) into the muscle once. - cyclobenzaprine (FLEXERIL) 10 MG tablet; Take 1 tablet (10 mg total) by mouth 3 (three) times daily as needed for muscle spasms.  Dispense: 30 tablet; Refill: 0 - meloxicam (MOBIC) 15 MG tablet; Take 1 tablet (15 mg total) by mouth daily.  Dispense: 30 tablet; Refill: 0  2. Sciatic leg pain - ketorolac (TORADOL) injection 60 mg; Inject 2 mLs (60 mg total) into the muscle once. - methylPREDNISolone acetate (DEPO-MEDROL) injection 80 mg; Inject 1 mL (80 mg total) into the muscle once. - cyclobenzaprine (FLEXERIL) 10 MG tablet; Take 1 tablet (10 mg total) by mouth 3 (three) times daily as needed for muscle spasms.  Dispense: 30 tablet; Refill: 0 -  meloxicam (MOBIC) 15 MG tablet; Take 1 tablet (15 mg total) by mouth daily.  Dispense: 30 tablet; Refill: 0  Rest Ice No other NSAID while taking mobic Sedation precaution discussed RTO prn BMP pending Evelina Dun, FNP

## 2014-07-15 NOTE — Addendum Note (Signed)
Addended by: Evelina Dun A on: 07/15/2014 05:29 PM   Modules accepted: Level of Service

## 2014-07-16 LAB — BMP8+EGFR
BUN/Creatinine Ratio: 19 (ref 8–20)
BUN: 10 mg/dL (ref 6–20)
CALCIUM: 9.7 mg/dL (ref 8.7–10.2)
CO2: 21 mmol/L (ref 18–29)
CREATININE: 0.53 mg/dL — AB (ref 0.57–1.00)
Chloride: 101 mmol/L (ref 97–108)
GFR calc Af Amer: 144 mL/min/{1.73_m2} (ref >59)
GFR, EST NON AFRICAN AMERICAN: 125 mL/min/{1.73_m2} (ref >59)
GLUCOSE: 100 mg/dL — AB (ref 65–99)
Potassium: 4.1 mmol/L (ref 3.5–5.2)
SODIUM: 141 mmol/L (ref 134–144)

## 2014-09-08 ENCOUNTER — Encounter: Payer: Self-pay | Admitting: Family Medicine

## 2014-09-08 ENCOUNTER — Ambulatory Visit (INDEPENDENT_AMBULATORY_CARE_PROVIDER_SITE_OTHER): Payer: Medicaid Other

## 2014-09-08 ENCOUNTER — Ambulatory Visit (INDEPENDENT_AMBULATORY_CARE_PROVIDER_SITE_OTHER): Payer: Medicaid Other | Admitting: Family Medicine

## 2014-09-08 VITALS — BP 101/68 | HR 80 | Temp 98.1°F | Ht 69.0 in | Wt 197.8 lb

## 2014-09-08 DIAGNOSIS — M549 Dorsalgia, unspecified: Secondary | ICD-10-CM

## 2014-09-08 MED ORDER — DICLOFENAC SODIUM 75 MG PO TBEC: 75.0000 mg | DELAYED_RELEASE_TABLET | Freq: Two times a day (BID) | ORAL | Status: DC

## 2014-09-08 MED ORDER — BETAMETHASONE SOD PHOS & ACET 6 (3-3) MG/ML IJ SUSP
6.0000 mg | Freq: Once | INTRAMUSCULAR | Status: AC
  Administered 2014-09-08: 6 mg via INTRAMUSCULAR

## 2014-09-08 MED ORDER — KETOROLAC TROMETHAMINE 60 MG/2ML IM SOLN
60.0000 mg | Freq: Once | INTRAMUSCULAR | Status: AC
  Administered 2014-09-08: 60 mg via INTRAMUSCULAR

## 2014-09-08 NOTE — Progress Notes (Signed)
Subjective:  Patient ID: Erica [name], female    DOB: 1980-09-04  Age: 34 y.o. MRN: [id number]  CC: Back Pain   HPI Erica [name] presents for 3 months of pain onset when she was taking A PIECE OF FURNITURE upstairs. It shifted and she had to catch it. She experienced a pop in the midback on the right that has been persistent. She did get some relief from treatments noted at the previous office visit of 07/14/2004. However she's been out of those medicines for a couple of weeks and the baseline pain has continued to increase. The pain is located primarily at the right mid thoracic region and radiates up into the superior aspect of the right trapezius. Additionally she states when she bends over the pain will move from the mid back all the way down the right leg into the calf and through the buttocks and thigh posteriorly. She describes the pain as a tingling and pinch. 6/10 in intensity.  History Narelle has a past medical history of Irregular heart beat.   She has past surgical history that includes Appendectomy and Dilation and curettage of uterus.   Her family history includes Cancer (age of onset: 27) in her maternal grandmother; Cancer (age of onset: 47) in her mother.She reports that she has been smoking Cigarettes.  She has been smoking about 0.25 packs per day. She has never used smokeless tobacco. She reports that she does not drink alcohol or use illicit drugs.  Outpatient Prescriptions Prior to Visit  Medication Sig Dispense Refill  . etonogestrel (NEXPLANON) 68 MG IMPL implant 1 each by Subdermal route once.    . Pediatric Multiple Vit-C-FA (PEDIATRIC MULTIVITAMIN) chewable tablet Chew 1 tablet by mouth daily.    . sertraline (ZOLOFT) 100 MG tablet 2 po qd 60 tablet 3  . aspirin-acetaminophen-caffeine (EXCEDRIN MIGRAINE) [phone] MG per tablet Take 1 tablet by mouth every 6 (six) hours as needed for headache.    . cyclobenzaprine (FLEXERIL) 10 MG tablet Take 1 tablet (10 mg  total) by mouth 3 (three) times daily as needed for muscle spasms. (Patient not taking: Reported on 09/08/2014) 30 tablet 0  . ibuprofen (ADVIL,MOTRIN) 200 MG tablet Take 800 mg by mouth every 6 (six) hours as needed.    Marland Kitchen KETOCONAZOLE, TOPICAL, 1 % SHAM Apply to affected areas daily for 14 days and then as needed thereafter. (Patient not taking: Reported on 07/15/2014) 200 mL 1  . meloxicam (MOBIC) 15 MG tablet Take 1 tablet (15 mg total) by mouth daily. (Patient not taking: Reported on 09/08/2014) 30 tablet 0   No facility-administered medications prior to visit.    ROS Review of Systems  Constitutional: Negative for fever, chills, diaphoresis, appetite change, fatigue and unexpected weight change.  HENT: Negative for congestion, ear pain, hearing loss, postnasal drip, rhinorrhea, sneezing, sore throat and trouble swallowing.   Eyes: Negative for pain.  Respiratory: Negative for cough, chest tightness and shortness of breath.   Cardiovascular: Negative for chest pain and palpitations.  Gastrointestinal: Negative for nausea, vomiting, abdominal pain, diarrhea and constipation.  Genitourinary: Negative for dysuria, frequency and menstrual problem.  Musculoskeletal: Positive for myalgias, back pain and arthralgias. Negative for joint swelling.       See history of present illness   Skin: Negative for rash.  Neurological: Negative for dizziness, weakness, numbness and headaches.  Psychiatric/Behavioral: Negative for dysphoric mood and agitation.    Objective:  BP 101/68 mmHg  Pulse 80  Temp(Src) 98.1 F (36.7  C) (Oral)  Ht 5\' 9"  (1.753 m)  Wt 197 lb 12.8 oz (89.721 kg)  BMI 29.20 kg/m2  LMP 09/01/2014  BP Readings from Last 3 Encounters:  09/08/14 101/68  07/15/14 109/77  05/12/14 116/79    Wt Readings from Last 3 Encounters:  09/08/14 197 lb 12.8 oz (89.721 kg)  07/15/14 198 lb 6.4 oz (89.994 kg)  05/12/14 200 lb (90.719 kg)     Physical Exam  Constitutional: She is  oriented to person, place, and time. She appears well-developed and well-nourished. No distress.  HENT:  Head: Normocephalic and atraumatic.  Right Ear: External ear normal.  Left Ear: External ear normal.  Nose: Nose normal.  Mouth/Throat: Oropharynx is clear and moist.  Eyes: Conjunctivae and EOM are normal. Pupils are equal, round, and reactive to light.  Neck: Normal range of motion. Neck supple. No thyromegaly present.  Cardiovascular: Normal rate, regular rhythm and normal heart sounds.   No murmur heard. Pulmonary/Chest: Effort normal and breath sounds normal. No respiratory distress. She has no wheezes. She has no rales.  Abdominal: Soft. Bowel sounds are normal. She exhibits no distension. There is no tenderness.  Musculoskeletal: Tenderness: thoracic back on the right near the T6 regionThere is palpable spasm and edema.  Lymphadenopathy:    She has no cervical adenopathy.  Neurological: She is alert and oriented to person, place, and time. She has normal reflexes.  Skin: Skin is warm and dry.  Psychiatric: She has a normal mood and affect. Her behavior is normal. Judgment and thought content normal.    No results found for: HGBA1C  Lab Results  Component Value Date   WBC 10.7* 05/12/2014   HGB 14.2 05/12/2014   HCT 44.9 05/12/2014   PLT 233 01/03/2014   GLUCOSE 100* 07/15/2014   CHOL 222* 05/12/2014   TRIG 260* 05/12/2014   HDL 29* 05/12/2014   ALT 14 05/12/2014   AST 10 05/12/2014   NA 141 07/15/2014   K 4.1 07/15/2014   CL 101 07/15/2014   CREATININE 0.53* 07/15/2014   BUN 10 07/15/2014   CO2 21 07/15/2014   TSH 2.090 05/12/2014    US Transvaginal Non-ob  01/03/2014   CLINICAL DATA:  34 year old female with painful intercourse an intermittent spotting since 10/24  EXAM: TRANSABDOMINAL AND TRANSVAGINAL ULTRASOUND OF PELVIS  TECHNIQUE: Both transabdominal and transvaginal ultrasound examinations of the pelvis were performed. Transabdominal technique was  performed for global imaging of the pelvis including uterus, ovaries, adnexal regions, and pelvic cul-de-sac. It was necessary to proceed with endovaginal exam following the transabdominal exam to visualize the bilateral adnexa.  COMPARISON:  Prior abdominal alters facet Prior pelvic ultrasound 12/30/2006  FINDINGS: Uterus  Measurements: 7.9 x 3.5 x 4.4 cm. No fibroids or other mass visualized.  Endometrium  Thickness: 5 mm.  No focal abnormality visualized.  Right ovary  Measurements: 2.7 x 2.8 x 2.1 cm. Normal appearance/no adnexal mass.  Left ovary  Measurements: 2.9 x 2.3 x 1.8 cm. 1.9 x 0.8 x 1.5 cm crenated hypoechoic cystic structure within the ovary likely representative of recent ovulation or recently ruptured follicular cyst. No evidence of hemorrhage, mural nodularity or other complicating feature.  Other findings  No free fluid.  IMPRESSION: Unremarkable pelvic ultrasound.   Electronically Signed   By: Jacqulynn Cadet M.D.   On: 01/03/2014 15:41   US Pelvis Complete  01/03/2014   CLINICAL DATA:  34 year old female with painful intercourse an intermittent spotting since 10/24  EXAM: TRANSABDOMINAL AND TRANSVAGINAL ULTRASOUND  OF PELVIS  TECHNIQUE: Both transabdominal and transvaginal ultrasound examinations of the pelvis were performed. Transabdominal technique was performed for global imaging of the pelvis including uterus, ovaries, adnexal regions, and pelvic cul-de-sac. It was necessary to proceed with endovaginal exam following the transabdominal exam to visualize the bilateral adnexa.  COMPARISON:  Prior abdominal alters facet Prior pelvic ultrasound 12/30/2006  FINDINGS: Uterus  Measurements: 7.9 x 3.5 x 4.4 cm. No fibroids or other mass visualized.  Endometrium  Thickness: 5 mm.  No focal abnormality visualized.  Right ovary  Measurements: 2.7 x 2.8 x 2.1 cm. Normal appearance/no adnexal mass.  Left ovary  Measurements: 2.9 x 2.3 x 1.8 cm. 1.9 x 0.8 x 1.5 cm crenated hypoechoic cystic structure  within the ovary likely representative of recent ovulation or recently ruptured follicular cyst. No evidence of hemorrhage, mural nodularity or other complicating feature.  Other findings  No free fluid.  IMPRESSION: Unremarkable pelvic ultrasound.   Electronically Signed   By: Jacqulynn Cadet M.D.   On: 01/03/2014 15:41    Assessment & Plan:   Ziare was seen today for back pain.  Diagnoses and all orders for this visit:  Mid back pain on right side Orders: -     DG Thoracic Spine 2 View -     betamethasone acetate-betamethasone sodium phosphate (CELESTONE) injection 6 mg; Inject 1 mL (6 mg total) into the muscle once. -     ketorolac (TORADOL) injection 60 mg; Inject 2 mLs (60 mg total) into the muscle once. -     Ambulatory referral to Physical Therapy  Other orders -     diclofenac (VOLTAREN) 75 MG EC tablet; Take 1 tablet (75 mg total) by mouth 2 (two) times daily.   I have discontinued Ms. Vanhecke's KETOCONAZOLE (TOPICAL), aspirin-acetaminophen-caffeine, ibuprofen, cyclobenzaprine, and meloxicam. I am also having her start on diclofenac. Additionally, I am having her maintain her pediatric multivitamin, etonogestrel, and sertraline. We administered betamethasone acetate-betamethasone sodium phosphate and ketorolac.  Meds ordered this encounter  Medications  . betamethasone acetate-betamethasone sodium phosphate (CELESTONE) injection 6 mg    Sig:   . ketorolac (TORADOL) injection 60 mg    Sig:   . diclofenac (VOLTAREN) 75 MG EC tablet    Sig: Take 1 tablet (75 mg total) by mouth 2 (two) times daily.    Dispense:  60 tablet    Refill:  2   XR T Spine- T7 misalignment with possible scoliosis  Follow-up: Return in about 2 weeks (around 09/22/2014), or if symptoms worsen or fail to improve.  Claretta Fraise, M.D.

## 2014-09-11 ENCOUNTER — Telehealth: Payer: Self-pay | Admitting: Family Medicine

## 2014-09-11 NOTE — Telephone Encounter (Signed)
Pt stated Medicaid will not pay for PT only for PT evaluation Encouraged pt to go for evaluation

## 2014-09-14 NOTE — Telephone Encounter (Signed)
Have her go for evaluation. They can at least give her some home treatments to do.

## 2014-09-14 NOTE — Telephone Encounter (Signed)
Detailed message left for patient per DPR. 

## 2014-09-21 ENCOUNTER — Ambulatory Visit: Payer: Medicaid Other | Attending: Family Medicine | Admitting: Physical Therapy

## 2014-09-21 DIAGNOSIS — M549 Dorsalgia, unspecified: Secondary | ICD-10-CM | POA: Diagnosis present

## 2014-09-21 DIAGNOSIS — R202 Paresthesia of skin: Secondary | ICD-10-CM | POA: Diagnosis present

## 2014-09-21 DIAGNOSIS — R200 Anesthesia of skin: Secondary | ICD-10-CM

## 2014-09-21 NOTE — Therapy (Signed)
[facility] [place], [place], [address] Phone: [phone]   Fax:  [phone]  Physical Therapy Evaluation  Patient Details  Name: Erica [name] MRN: [id number] Date of Birth: 31-Dec-1980 Referring Provider:  Claretta Fraise, MD  Encounter Date: 09/21/2014      PT End of Session - 09/21/14 0940    Visit Number 1   Number of Visits 1   Date for PT Re-Evaluation 09/21/14   PT Start Time 0900   PT Stop Time 0931   PT Time Calculation (min) 31 min   Behavior During Therapy [facility] for tasks assessed/performed      Past Medical History  Diagnosis Date  . Irregular heart beat     Past Surgical History  Procedure Laterality Date  . Appendectomy    . Dilation and curettage of uterus      There were no vitals filed for this visit.  Visit Diagnosis:  Mid back pain - Plan: PT plan of care cert/re-cert  Right upper extremity numbness - Plan: PT plan of care cert/re-cert      Subjective Assessment - 09/21/14 0929    Subjective My right hand goes numb frequently and I stay in pain.   Limitations Standing;Sitting;Reading;Walking;House hold activities   How long can you sit comfortably? 15 minutes.   How long can you stand comfortably? 15 minutes.   How long can you walk comfortably? 10-15 minutes.   Patient Stated Goals Would like to find out why I'm hurting so much.            [facility] PT Assessment - 09/21/14 0001    Assessment   Medical Diagnosis Mid-back pain on right.   Onset Date/Surgical Date --  5 months.   Hand Dominance --  Right.   Precautions   Precautions None   Restrictions   Weight Bearing Restrictions No   Balance Screen   Has the patient fallen in the past 6 months No   Has the patient had a decrease in activity level because of a fear of falling?  No   Is the patient reluctant to leave their home because of a fear of falling?  No   Home Ec[employer] residence   Prior Function   Level of Independence Independent   Cognition   Overall Cognitive Status Within Functional Limits for tasks assessed   Posture/Postural Control   Posture Comments Rounded shoulders.   ROM / Strength   AROM / PROM / Strength AROM;Strength   AROM   Overall AROM Comments Right active cervical rotation= 68 degrees and left= 73 degrees.   Strength   Overall Strength Comments Right grip unable to register strength on hand dynamometer and left= 55#.  Patient is also globally weak throughtout her right shoulder musculature graded at 3/5.   Palpation   Palpation comment Very tender to palpation in area of T3-T6 with overpressure over spinous processes and right sided paraspinal musculature   Special Tests    Special Tests --  DTR's = bilaterally.                                PT Long Term Goals - 09/21/14 0946    PT LONG TERM GOAL #1   Title Evaluation only.               Plan - 09/21/14 0932    Clinical Impression Statement The patient  states approximately 5 months ago she was helping assisit with a heavy piece of furniture.  She was behind the piece while two others were pulling it up stairs.  The furniture piece lid back toward her and she reflexively tried to stop the piece from falling on her and rapidly extended both arms.  As the weight of the furniture peice fell against her she felt an intense "pop" in her mid-back and immediate pain.  Her pain has worsened over the last few months with an average dailiy pain-level of 7-8/10 and numbnes over her forearm and right hand.  This has dramatically decreased her ability o perform her ADL's.   Pt will benefit from skilled therapeutic intervention in order to improve on the following deficits Pain;Decreased activity tolerance   Rehab Potential Fair   PT Frequency --  Evaluation only.   PT Next Visit Plan Medicaid allows evaluation only.   Recommended Other Services Per MD:  MRI.   Consulted  and Agree with Plan of Care Patient         Problem List Patient Active Problem List   Diagnosis Date Noted  . GAD (generalized anxiety disorder) 05/02/2013  . OCD (obsessive compulsive disorder) 05/02/2013  . Irregular heart beat     APPLEGATE, [place] MPT 09/21/2014, 10:24 AM  [facility] [place], [place], [address] Phone: [phone]   Fax:  [phone]

## 2014-09-22 ENCOUNTER — Telehealth: Payer: Self-pay | Admitting: Family Medicine

## 2014-09-22 NOTE — Telephone Encounter (Signed)
Follow up appointment scheduled with Dr. Livia Snellen to discuss MRI.

## 2014-09-22 NOTE — Telephone Encounter (Signed)
Patient had a DG Thoracic spine done on 09-08-14.  She has been going for physical therapy.  She was told ,per them ,she should get a MRI.

## 2014-09-22 NOTE — Telephone Encounter (Signed)
Arrange f/u here

## 2014-09-24 ENCOUNTER — Encounter: Payer: Self-pay | Admitting: Family Medicine

## 2014-09-24 ENCOUNTER — Ambulatory Visit (INDEPENDENT_AMBULATORY_CARE_PROVIDER_SITE_OTHER): Payer: Medicaid Other | Admitting: Family Medicine

## 2014-09-24 VITALS — BP 105/69 | HR 69 | Temp 98.5°F | Ht 69.0 in | Wt 202.0 lb

## 2014-09-24 DIAGNOSIS — M5010 Cervical disc disorder with radiculopathy, unspecified cervical region: Secondary | ICD-10-CM

## 2014-09-24 MED ORDER — TRAMADOL HCL 50 MG PO TABS: 50.0000 mg | ORAL_TABLET | Freq: Four times a day (QID) | ORAL | Status: DC | PRN

## 2014-09-24 MED ORDER — PREDNISONE 20 MG PO TABS: 20.0000 mg | ORAL_TABLET | Freq: Two times a day (BID) | ORAL | Status: DC

## 2014-09-24 NOTE — Progress Notes (Signed)
Subjective:  Patient ID: Erica [name], female    DOB: 1980/12/08  Age: 34 y.o. MRN: [id number]  CC: Back Pain   HPI [name] presents for continued right arm and shoulder pain as well as bilateral upper thoracic pain. Pain is described as 7/10 severe ache ongoing now with worsening over about 4-1/2 months with a acute onset while moving a piece of furniture. See my previous note. She was evaluated by physical therapy. That note was reviewed showing that he saw significant weakness and evidence for radiculopathy and has recommended an MRI.  History [name] has a past medical history of Irregular heart beat.   She has past surgical history that includes Appendectomy and Dilation and curettage of uterus.   Her family history includes Cancer (age of onset: 4) in her maternal grandmother; Cancer (age of onset: 3) in her mother.She reports that she has been smoking Cigarettes.  She has been smoking about 0.25 packs per day. She has never used smokeless tobacco. She reports that she does not drink alcohol or use illicit drugs.  Outpatient Prescriptions Prior to Visit  Medication Sig Dispense Refill  . diclofenac (VOLTAREN) 75 MG EC tablet Take 1 tablet (75 mg total) by mouth 2 (two) times daily. 60 tablet 2  . etonogestrel (NEXPLANON) 68 MG IMPL implant 1 each by Subdermal route once.    . Pediatric Multiple Vit-C-FA (PEDIATRIC MULTIVITAMIN) chewable tablet Chew 1 tablet by mouth daily.    . sertraline (ZOLOFT) 100 MG tablet 2 po qd 60 tablet 3   No facility-administered medications prior to visit.    ROS Review of Systems  Constitutional: Negative for fever, chills, diaphoresis, appetite change and fatigue.  HENT: Negative for congestion, ear pain, hearing loss, postnasal drip, rhinorrhea, sore throat and trouble swallowing.   Respiratory: Negative for cough, chest tightness and shortness of breath.   Cardiovascular: Negative for chest pain and palpitations.  Gastrointestinal:  Negative for abdominal pain.  Musculoskeletal: Negative for arthralgias.  Skin: Negative for rash.    Objective:  BP 105/69 mmHg  Pulse 69  Temp(Src) 98.5 F (36.9 C) (Oral)  Ht 5\' 9"  (1.753 m)  Wt 202 lb (91.627 kg)  BMI 29.82 kg/m2  LMP 09/01/2014  BP Readings from Last 3 Encounters:  09/24/14 105/69  09/08/14 101/68  07/15/14 109/77    Wt Readings from Last 3 Encounters:  09/24/14 202 lb (91.627 kg)  09/08/14 197 lb 12.8 oz (89.721 kg)  07/15/14 198 lb 6.4 oz (89.994 kg)     Physical Exam  Constitutional: She is oriented to person, place, and time. She appears well-developed and well-nourished. She appears distressed (mildly from neck shoulder and back pain).  HENT:  Head: Normocephalic and atraumatic.  Eyes: Conjunctivae are normal. Pupils are equal, round, and reactive to light.  Neck: Normal range of motion. Neck supple. No thyromegaly present.  Cardiovascular: Normal rate, regular rhythm and normal heart sounds.   No murmur heard. Pulmonary/Chest: Effort normal and breath sounds normal. No respiratory distress. She has no wheezes. She has no rales.  Abdominal: Soft. Bowel sounds are normal. She exhibits no distension. There is no tenderness.  Musculoskeletal: Normal range of motion. She exhibits tenderness (moderately severe tenderness of the T one through 6 musculature bilaterally. Full range of motion of the right upper extremity with marked weakness for grip and flexion at elbow. See physical therapy report as well.).  Neurological: She is alert and oriented to person, place, and time.  Skin: Skin is  warm and dry.  Psychiatric: She has a normal mood and affect. Her behavior is normal. Judgment and thought content normal.    No results found for: HGBA1C  Lab Results  Component Value Date   WBC 10.7* 05/12/2014   HGB 14.2 05/12/2014   HCT 44.9 05/12/2014   PLT 233 01/03/2014   GLUCOSE 100* 07/15/2014   CHOL 222* 05/12/2014   TRIG 260* 05/12/2014   HDL  29* 05/12/2014   ALT 14 05/12/2014   AST 10 05/12/2014   NA 141 07/15/2014   K 4.1 07/15/2014   CL 101 07/15/2014   CREATININE 0.53* 07/15/2014   BUN 10 07/15/2014   CO2 21 07/15/2014   TSH 2.090 05/12/2014    US Transvaginal Non-ob  01/03/2014   CLINICAL DATA:  34 year old female with painful intercourse an intermittent spotting since 10/24  EXAM: TRANSABDOMINAL AND TRANSVAGINAL ULTRASOUND OF PELVIS  TECHNIQUE: Both transabdominal and transvaginal ultrasound examinations of the pelvis were performed. Transabdominal technique was performed for global imaging of the pelvis including uterus, ovaries, adnexal regions, and pelvic cul-de-sac. It was necessary to proceed with endovaginal exam following the transabdominal exam to visualize the bilateral adnexa.  COMPARISON:  Prior abdominal alters facet Prior pelvic ultrasound 12/30/2006  FINDINGS: Uterus  Measurements: 7.9 x 3.5 x 4.4 cm. No fibroids or other mass visualized.  Endometrium  Thickness: 5 mm.  No focal abnormality visualized.  Right ovary  Measurements: 2.7 x 2.8 x 2.1 cm. Normal appearance/no adnexal mass.  Left ovary  Measurements: 2.9 x 2.3 x 1.8 cm. 1.9 x 0.8 x 1.5 cm crenated hypoechoic cystic structure within the ovary likely representative of recent ovulation or recently ruptured follicular cyst. No evidence of hemorrhage, mural nodularity or other complicating feature.  Other findings  No free fluid.  IMPRESSION: Unremarkable pelvic ultrasound.   Electronically Signed   By: Jacqulynn Cadet M.D.   On: 01/03/2014 15:41   US Pelvis Complete  01/03/2014   CLINICAL DATA:  34 year old female with painful intercourse an intermittent spotting since 10/24  EXAM: TRANSABDOMINAL AND TRANSVAGINAL ULTRASOUND OF PELVIS  TECHNIQUE: Both transabdominal and transvaginal ultrasound examinations of the pelvis were performed. Transabdominal technique was performed for global imaging of the pelvis including uterus, ovaries, adnexal regions, and pelvic  cul-de-sac. It was necessary to proceed with endovaginal exam following the transabdominal exam to visualize the bilateral adnexa.  COMPARISON:  Prior abdominal alters facet Prior pelvic ultrasound 12/30/2006  FINDINGS: Uterus  Measurements: 7.9 x 3.5 x 4.4 cm. No fibroids or other mass visualized.  Endometrium  Thickness: 5 mm.  No focal abnormality visualized.  Right ovary  Measurements: 2.7 x 2.8 x 2.1 cm. Normal appearance/no adnexal mass.  Left ovary  Measurements: 2.9 x 2.3 x 1.8 cm. 1.9 x 0.8 x 1.5 cm crenated hypoechoic cystic structure within the ovary likely representative of recent ovulation or recently ruptured follicular cyst. No evidence of hemorrhage, mural nodularity or other complicating feature.  Other findings  No free fluid.  IMPRESSION: Unremarkable pelvic ultrasound.   Electronically Signed   By: Jacqulynn Cadet M.D.   On: 01/03/2014 15:41    Assessment & Plan:   Maanvi was seen today for back pain.  Diagnoses and all orders for this visit:  Cervical disc disorder with radiculopathy of cervical region Orders: -     MR Cervical Spine Wo Contrast; Future -     Ambulatory referral to Neurosurgery -     NCV with EMG(electromyography); Future  Other orders -  predniSONE (DELTASONE) 20 MG tablet; Take 1 tablet (20 mg total) by mouth 2 (two) times daily with a meal. -     traMADol (ULTRAM) 50 MG tablet; Take 1 tablet (50 mg total) by mouth 4 (four) times daily as needed for moderate pain.  I am having Ms. Juba start on predniSONE and traMADol. I am also having her maintain her pediatric multivitamin, etonogestrel, sertraline, and diclofenac.  Meds ordered this encounter  Medications  . predniSONE (DELTASONE) 20 MG tablet    Sig: Take 1 tablet (20 mg total) by mouth 2 (two) times daily with a meal.    Dispense:  60 tablet    Refill:  0  . traMADol (ULTRAM) 50 MG tablet    Sig: Take 1 tablet (50 mg total) by mouth 4 (four) times daily as needed for moderate pain.     Dispense:  60 tablet    Refill:  02     Follow-up: No Follow-up on file.  Claretta Fraise, M.D.

## 2014-10-01 ENCOUNTER — Telehealth: Payer: Self-pay | Admitting: Family Medicine

## 2014-10-06 ENCOUNTER — Ambulatory Visit (HOSPITAL_COMMUNITY)
Admission: RE | Admit: 2014-10-06 | Discharge: 2014-10-06 | Disposition: A | Discharge status: Home / Self Care | Payer: Medicaid Other | Source: Ambulatory Visit | Attending: Family Medicine | Admitting: Family Medicine

## 2014-10-06 DIAGNOSIS — M542 Cervicalgia: Secondary | ICD-10-CM | POA: Insufficient documentation

## 2014-10-06 DIAGNOSIS — M5010 Cervical disc disorder with radiculopathy, unspecified cervical region: Secondary | ICD-10-CM

## 2014-10-26 ENCOUNTER — Ambulatory Visit (INDEPENDENT_AMBULATORY_CARE_PROVIDER_SITE_OTHER): Payer: Medicaid Other | Admitting: Family Medicine

## 2014-10-26 ENCOUNTER — Encounter: Payer: Self-pay | Admitting: Family Medicine

## 2014-10-26 VITALS — BP 111/67 | HR 92 | Temp 98.3°F | Ht 69.0 in | Wt 205.2 lb

## 2014-10-26 DIAGNOSIS — M5010 Cervical disc disorder with radiculopathy, unspecified cervical region: Secondary | ICD-10-CM

## 2014-10-26 MED ORDER — PREDNISONE 20 MG PO TABS: 20.0000 mg | ORAL_TABLET | Freq: Every day | ORAL | Status: DC

## 2014-10-26 MED ORDER — CYCLOBENZAPRINE HCL 10 MG PO TABS: 10.0000 mg | ORAL_TABLET | Freq: Three times a day (TID) | ORAL | Status: DC | PRN

## 2014-10-26 NOTE — Progress Notes (Signed)
Subjective:  Patient ID: Erica [name], female    DOB: [birth year]  Age: 34 y.o. MRN: [id number]  CC: Back Pain   HPI Erica [name] presents for  thoracic back on right. Continues having tingling. Back pain at right upper back. Had MRI cervical spine,showed cervical spasm, neg for herniation or nerve damage, however pain is dermatomal. Moderately severe, somewhat improved since last OV. Finishes prednisone in 2 days.  History Erica [name] has a past medical history of Irregular heart beat.   She has past surgical history that includes Appendectomy and Dilation and curettage of uterus.   Her family history includes Cancer (age of onset: 15) in her maternal grandmother; Cancer (age of onset: 69) in her mother.She reports that she has been smoking Cigarettes.  She has been smoking about 0.25 packs per day. She has never used smokeless tobacco. She reports that she does not drink alcohol or use illicit drugs.  Outpatient Prescriptions Prior to Visit  Medication Sig Dispense Refill  . etonogestrel (NEXPLANON) 68 MG IMPL implant 1 each by Subdermal route once.    . Pediatric Multiple Vit-C-FA (PEDIATRIC MULTIVITAMIN) chewable tablet Chew 1 tablet by mouth daily.    . sertraline (ZOLOFT) 100 MG tablet 2 po qd 60 tablet 3  . traMADol (ULTRAM) 50 MG tablet Take 1 tablet (50 mg total) by mouth 4 (four) times daily as needed for moderate pain. 60 tablet 02  . predniSONE (DELTASONE) 20 MG tablet Take 1 tablet (20 mg total) by mouth 2 (two) times daily with a meal. 60 tablet 0  . diclofenac (VOLTAREN) 75 MG EC tablet Take 1 tablet (75 mg total) by mouth 2 (two) times daily. (Patient not taking: Reported on 10/26/2014) 60 tablet 2   No facility-administered medications prior to visit.    ROS Review of Systems  Constitutional: Negative for fever, chills, diaphoresis, appetite change and fatigue.  HENT: Negative for congestion, ear pain, hearing loss, postnasal drip, rhinorrhea, sore throat and trouble  swallowing.   Respiratory: Negative for cough, chest tightness and shortness of breath.   Cardiovascular: Negative for chest pain and palpitations.  Gastrointestinal: Negative for abdominal pain.  Musculoskeletal: Positive for back pain.  Skin: Negative for rash.    Objective:  BP 111/67 mmHg  Pulse 92  Temp(Src) 98.3 F (36.8 C) (Oral)  Ht 5\' 9"  (1.753 m)  Wt 205 lb 3.2 oz (93.078 kg)  BMI 30.29 kg/m2  BP Readings from Last 3 Encounters:  10/26/14 111/67  09/24/14 105/69  09/08/14 101/68    Wt Readings from Last 3 Encounters:  10/26/14 205 lb 3.2 oz (93.078 kg)  09/24/14 202 lb (91.627 kg)  09/08/14 197 lb 12.8 oz (89.721 kg)     Physical Exam  Constitutional: She is oriented to person, place, and time. She appears well-developed and well-nourished. No distress.  HENT:  Head: Normocephalic and atraumatic.  Eyes: Conjunctivae are normal. Pupils are equal, round, and reactive to light.  Neck: Normal range of motion. Neck supple. No thyromegaly present.  Cardiovascular: Normal rate, regular rhythm and normal heart sounds.   No murmur heard. Pulmonary/Chest: Effort normal and breath sounds normal. No respiratory distress. She has no wheezes. She has no rales.  Abdominal: Soft. Bowel sounds are normal. She exhibits no distension. There is no tenderness.  Musculoskeletal: Normal range of motion. She exhibits tenderness (spasm in thoracic spinalis on right).  Lymphadenopathy:    She has no cervical adenopathy.  Neurological: She is alert and oriented to person, place,  and time.  Skin: Skin is warm and dry.  Psychiatric: She has a normal mood and affect. Her behavior is normal. Judgment and thought content normal.    No results found for: HGBA1C  Lab Results  Component Value Date   WBC 10.7* 05/12/2014   HGB 14.2 05/12/2014   HCT 44.9 05/12/2014   PLT 233 01/03/2014   GLUCOSE 100* 07/15/2014   CHOL 222* 05/12/2014   TRIG 260* 05/12/2014   HDL 29* 05/12/2014   ALT  14 05/12/2014   AST 10 05/12/2014   NA 141 07/15/2014   K 4.1 07/15/2014   CL 101 07/15/2014   CREATININE 0.53* 07/15/2014   BUN 10 07/15/2014   CO2 21 07/15/2014   TSH 2.090 05/12/2014    Mr Cervical Spine Wo Contrast  10/06/2014   CLINICAL DATA:  Neck pain radiating to both shoulders for 5 months. No known injury. Uncontrolled motion.  EXAM: MRI CERVICAL SPINE WITHOUT CONTRAST  TECHNIQUE: Multiplanar, multisequence MR imaging of the cervical spine was performed. No intravenous contrast was administered.  COMPARISON:  None.  FINDINGS: There is no evidence for disc degeneration, disc herniation, vertebral body abnormality, or paraspinous mass. Slight straightening of the normal cervical lordosis could be positional or due to mild spasm. Axial images through the individual disc spaces demonstrate no foraminal narrowing, spinal stenosis, for significant facet disease. Flow voids are maintained in both vertebral arteries. Paravertebral soft tissues are unremarkable. No tonsillar herniation. No cord abnormality or intraspinal mass lesion.  IMPRESSION: Minor straightening of the normal cervical lordosis, nonspecific. No focal abnormality of the cervical spine.   Electronically Signed   By: Staci Righter M.D.   On: 10/06/2014 18:09    Assessment & Plan:   Erica [name] was seen today for back pain.  Diagnoses and all orders for this visit:  Cervical disc disorder with radiculopathy of cervical region -     NCV with EMG(electromyography); Future  Other orders -     predniSONE (DELTASONE) 20 MG tablet; Take 1 tablet (20 mg total) by mouth daily with breakfast. -     cyclobenzaprine (FLEXERIL) 10 MG tablet; Take 1 tablet (10 mg total) by mouth 3 (three) times daily as needed for muscle spasms. Or 2 at bedtime   I have changed Erica [name]'s predniSONE. I am also having her start on cyclobenzaprine. Additionally, I am having her maintain her pediatric multivitamin, etonogestrel, sertraline, diclofenac, and  traMADol.  Meds ordered this encounter  Medications  . predniSONE (DELTASONE) 20 MG tablet    Sig: Take 1 tablet (20 mg total) by mouth daily with breakfast.    Dispense:  30 tablet    Refill:  1  . cyclobenzaprine (FLEXERIL) 10 MG tablet    Sig: Take 1 tablet (10 mg total) by mouth 3 (three) times daily as needed for muscle spasms. Or 2 at bedtime    Dispense:  90 tablet    Refill:  1     Follow-up: Return in about 1 month (around 11/26/2014).  Claretta Fraise, M.D.

## 2014-11-27 ENCOUNTER — Ambulatory Visit: Payer: Medicaid Other | Admitting: Family Medicine

## 2014-12-15 ENCOUNTER — Encounter: Payer: Self-pay | Admitting: Family Medicine

## 2014-12-15 ENCOUNTER — Ambulatory Visit (INDEPENDENT_AMBULATORY_CARE_PROVIDER_SITE_OTHER): Payer: Medicaid Other | Admitting: Family Medicine

## 2014-12-15 VITALS — BP 117/76 | HR 92 | Temp 97.9°F | Ht 69.0 in | Wt 206.4 lb

## 2014-12-15 DIAGNOSIS — M79601 Pain in right arm: Secondary | ICD-10-CM

## 2014-12-15 DIAGNOSIS — M546 Pain in thoracic spine: Secondary | ICD-10-CM | POA: Diagnosis not present

## 2014-12-15 DIAGNOSIS — F17200 Nicotine dependence, unspecified, uncomplicated: Secondary | ICD-10-CM | POA: Diagnosis not present

## 2014-12-15 DIAGNOSIS — R202 Paresthesia of skin: Secondary | ICD-10-CM | POA: Diagnosis not present

## 2014-12-15 DIAGNOSIS — M79603 Pain in arm, unspecified: Secondary | ICD-10-CM

## 2014-12-15 DIAGNOSIS — M79602 Pain in left arm: Principal | ICD-10-CM

## 2014-12-15 DIAGNOSIS — Z23 Encounter for immunization: Secondary | ICD-10-CM

## 2014-12-15 MED ORDER — TRAMADOL HCL 50 MG PO TABS: 50.0000 mg | ORAL_TABLET | Freq: Four times a day (QID) | ORAL | Status: DC | PRN

## 2014-12-15 MED ORDER — VARENICLINE TARTRATE 0.5 MG X 11 & 1 MG X 42 PO MISC: ORAL | Status: DC

## 2014-12-15 MED ORDER — DICLOFENAC SODIUM 75 MG PO TBEC: 75.0000 mg | DELAYED_RELEASE_TABLET | Freq: Two times a day (BID) | ORAL | Status: DC

## 2014-12-15 NOTE — Progress Notes (Signed)
Subjective:  Patient ID: Erica [name], female    DOB: [birth year]  Age: 34 y.o. MRN: [id number]  CC: Back Pain   HPI ANESHIA JACQUET presents for continued weakness of RUE with moderately severe pain. Also midback pain between shoulder blades is moderate. The left arm is now having tingling, especially at hypothenar area. No relation to activity. Still seems to come from the shoulder. MRI cervical neck reviewed with pt. No apparent ceervical cause of sx.  Smoking 1+ppd cigarettes. Requests chantix.  History Erica [name] has a past medical history of Irregular heart beat.   She has past surgical history that includes Appendectomy and Dilation and curettage of uterus.   Her family history includes Cancer (age of onset: 56) in her maternal grandmother; Cancer (age of onset: 90) in her mother.She reports that she has been smoking Cigarettes.  She has been smoking about 0.25 packs per day. She has never used smokeless tobacco. She reports that she does not drink alcohol or use illicit drugs.  Outpatient Prescriptions Prior to Visit  Medication Sig Dispense Refill  . cyclobenzaprine (FLEXERIL) 10 MG tablet Take 1 tablet (10 mg total) by mouth 3 (three) times daily as needed for muscle spasms. Or 2 at bedtime 90 tablet 1  . etonogestrel (NEXPLANON) 68 MG IMPL implant 1 each by Subdermal route once.    . Pediatric Multiple Vit-C-FA (PEDIATRIC MULTIVITAMIN) chewable tablet Chew 1 tablet by mouth daily.    . sertraline (ZOLOFT) 100 MG tablet 2 po qd 60 tablet 3  . diclofenac (VOLTAREN) 75 MG EC tablet Take 1 tablet (75 mg total) by mouth 2 (two) times daily. 60 tablet 2  . traMADol (ULTRAM) 50 MG tablet Take 1 tablet (50 mg total) by mouth 4 (four) times daily as needed for moderate pain. 60 tablet 02  . predniSONE (DELTASONE) 20 MG tablet Take 1 tablet (20 mg total) by mouth daily with breakfast. (Patient not taking: Reported on 12/15/2014) 30 tablet 1   No facility-administered medications prior to  visit.    ROS Review of Systems  Constitutional: Negative for fever, chills, diaphoresis, appetite change, fatigue and unexpected weight change.  HENT: Negative for congestion, ear pain, hearing loss, postnasal drip, rhinorrhea, sneezing, sore throat and trouble swallowing.   Eyes: Negative for pain.  Respiratory: Negative for cough, chest tightness and shortness of breath.   Cardiovascular: Negative for chest pain and palpitations.  Gastrointestinal: Negative for nausea, vomiting, abdominal pain, diarrhea and constipation.  Genitourinary: Negative for dysuria, frequency and menstrual problem.  Musculoskeletal: Negative for joint swelling and arthralgias.  Skin: Negative for rash.  Neurological: Positive for headaches (unchanged, daily on awakening. ). Negative for dizziness, weakness and numbness.  Psychiatric/Behavioral: Negative for dysphoric mood and agitation.    Objective:  BP 117/76 mmHg  Pulse 92  Temp(Src) 97.9 F (36.6 C) (Oral)  Ht 5\' 9"  (1.753 m)  Wt 206 lb 6.4 oz (93.622 kg)  BMI 30.47 kg/m2  BP Readings from Last 3 Encounters:  12/15/14 117/76  10/26/14 111/67  09/24/14 105/69    Wt Readings from Last 3 Encounters:  12/15/14 206 lb 6.4 oz (93.622 kg)  10/26/14 205 lb 3.2 oz (93.078 kg)  09/24/14 202 lb (91.627 kg)     Physical Exam  Constitutional: She is oriented to person, place, and time. She appears well-developed and well-nourished. No distress.  HENT:  Head: Normocephalic and atraumatic.  Right Ear: External ear normal.  Left Ear: External ear normal.  Nose: Nose normal.  Mouth/Throat: Oropharynx is clear and moist.  Eyes: Conjunctivae and EOM are normal. Pupils are equal, round, and reactive to light.  Neck: Normal range of motion. Neck supple. No thyromegaly present.  Cardiovascular: Normal rate, regular rhythm and normal heart sounds.   No murmur heard. Pulmonary/Chest: Effort normal and breath sounds normal. No respiratory distress. She has  no wheezes. She has no rales.  Abdominal: Soft. Bowel sounds are normal. She exhibits no distension. There is no tenderness.  Musculoskeletal:  Right grip is 3/10  Lymphadenopathy:    She has no cervical adenopathy.  Neurological: She is alert and oriented to person, place, and time. She has normal reflexes.  Skin: Skin is warm and dry.  Psychiatric: She has a normal mood and affect. Her behavior is normal. Judgment and thought content normal.    No results found for: HGBA1C  Lab Results  Component Value Date   WBC 10.7* 05/12/2014   HGB 14.2 05/12/2014   HCT 44.9 05/12/2014   PLT 233 01/03/2014   GLUCOSE 100* 07/15/2014   CHOL 222* 05/12/2014   TRIG 260* 05/12/2014   HDL 29* 05/12/2014   ALT 14 05/12/2014   AST 10 05/12/2014   NA 141 07/15/2014   K 4.1 07/15/2014   CL 101 07/15/2014   CREATININE 0.53* 07/15/2014   BUN 10 07/15/2014   CO2 21 07/15/2014   TSH 2.090 05/12/2014    Mr Cervical Spine Wo Contrast  10/06/2014  CLINICAL DATA:  Neck pain radiating to both shoulders for 5 months. No known injury. Uncontrolled motion. EXAM: MRI CERVICAL SPINE WITHOUT CONTRAST TECHNIQUE: Multiplanar, multisequence MR imaging of the cervical spine was performed. No intravenous contrast was administered. COMPARISON:  None. FINDINGS: There is no evidence for disc degeneration, disc herniation, vertebral body abnormality, or paraspinous mass. Slight straightening of the normal cervical lordosis could be positional or due to mild spasm. Axial images through the individual disc spaces demonstrate no foraminal narrowing, spinal stenosis, for significant facet disease. Flow voids are maintained in both vertebral arteries. Paravertebral soft tissues are unremarkable. No tonsillar herniation. No cord abnormality or intraspinal mass lesion. IMPRESSION: Minor straightening of the normal cervical lordosis, nonspecific. No focal abnormality of the cervical spine. Electronically Signed   By: Staci Righter  M.D.   On: 10/06/2014 18:09    Assessment & Plan:   Carlia was seen today for back pain.  Diagnoses and all orders for this visit:  Paresthesia and pain of both upper extremities -     Ambulatory referral to Neurology  Encounter for immunization  Midline thoracic back pain -     Ambulatory referral to Neurology  Tobacco dependency  Other orders -     Flu Vaccine QUAD 36+ mos IM -     diclofenac (VOLTAREN) 75 MG EC tablet; Take 1 tablet (75 mg total) by mouth 2 (two) times daily. -     traMADol (ULTRAM) 50 MG tablet; Take 1 tablet (50 mg total) by mouth 4 (four) times daily as needed for moderate pain. -     varenicline (CHANTIX STARTING MONTH PAK) 0.5 MG X 11 & 1 MG X 42 tablet; Take one 0.5 mg tablet by mouth once daily for 3 days, then increase to one 0.5 mg tablet twice daily for 4 days, then increase to one 1 mg tablet twice daily.   I have discontinued Ms. Rhue's predniSONE. I am also having her start on varenicline. Additionally, I am having her maintain her pediatric multivitamin, etonogestrel, sertraline, cyclobenzaprine,  diclofenac, and traMADol.  Meds ordered this encounter  Medications  . diclofenac (VOLTAREN) 75 MG EC tablet    Sig: Take 1 tablet (75 mg total) by mouth 2 (two) times daily.    Dispense:  60 tablet    Refill:  2  . traMADol (ULTRAM) 50 MG tablet    Sig: Take 1 tablet (50 mg total) by mouth 4 (four) times daily as needed for moderate pain.    Dispense:  60 tablet    Refill:  02  . varenicline (CHANTIX STARTING MONTH PAK) 0.5 MG X 11 & 1 MG X 42 tablet    Sig: Take one 0.5 mg tablet by mouth once daily for 3 days, then increase to one 0.5 mg tablet twice daily for 4 days, then increase to one 1 mg tablet twice daily.    Dispense:  53 tablet    Refill:  0     Follow-up: Return in about 1 month (around 01/15/2015) for smoking, paresthesia.  Claretta Fraise, M.D.

## 2014-12-29 ENCOUNTER — Other Ambulatory Visit: Payer: Self-pay | Admitting: Nurse Practitioner

## 2015-01-12 ENCOUNTER — Telehealth: Payer: Self-pay | Admitting: Family Medicine

## 2015-01-12 MED ORDER — VARENICLINE TARTRATE 1 MG PO TABS: 1.0000 mg | ORAL_TABLET | Freq: Two times a day (BID) | ORAL | Status: DC

## 2015-01-12 NOTE — Telephone Encounter (Signed)
Patient states that she has quit smoking completely and is feeling a lot better.  Informed patient that Rx has been sent to her pharmacy and she will need to continue to take this medication for 5 months.

## 2015-01-12 NOTE — Telephone Encounter (Signed)
Patient called stating she needs a refill on Chantix.

## 2015-01-12 NOTE — Telephone Encounter (Signed)
If she has completely discontinued smoking, I would be happy to send in the refill. If she is still smoking some then perhaps a recheck would be wise. Either way I sent in the prescription. Reminder that she needs to take it for 5 more months. Even if she is not smoking the risk of going back to smoking is much higher if she does not continue the medicine for that duration.

## 2015-01-12 NOTE — Addendum Note (Signed)
Addended by: Claretta Fraise on: 01/12/2015 10:34 AM   Modules accepted: Orders, Medications

## 2015-01-15 ENCOUNTER — Encounter: Payer: Self-pay | Admitting: Neurology

## 2015-01-15 ENCOUNTER — Ambulatory Visit (INDEPENDENT_AMBULATORY_CARE_PROVIDER_SITE_OTHER): Payer: Medicaid Other | Admitting: Neurology

## 2015-01-15 VITALS — BP 110/70 | HR 89 | Ht 69.0 in | Wt 208.2 lb

## 2015-01-15 DIAGNOSIS — R29898 Other symptoms and signs involving the musculoskeletal system: Secondary | ICD-10-CM | POA: Diagnosis not present

## 2015-01-15 DIAGNOSIS — R202 Paresthesia of skin: Secondary | ICD-10-CM | POA: Diagnosis not present

## 2015-01-15 DIAGNOSIS — M79601 Pain in right arm: Secondary | ICD-10-CM | POA: Diagnosis not present

## 2015-01-15 MED ORDER — GABAPENTIN 300 MG PO CAPS: ORAL_CAPSULE | ORAL | Status: DC

## 2015-01-15 NOTE — Patient Instructions (Signed)
1.  NCS/EMG of the right upper extremity.  We will call you with the results of the testing.  2.  Start gabapentin 300mg  as follows:  take one tablet at bedtime for one week then increase to one tablet twice daily 3.  Return to clinic in 3 months

## 2015-01-15 NOTE — Progress Notes (Signed)
[facility] Clinic Note - Initial Visit   Date: 01/15/2015  Erica [name] MRN: [id number] DOB: 03-25-1980   Dear Dr. Livia Snellen:  Thank you for your kind referral of Erica [name] for consultation of right arm paresthesias and pain. Although her history is well known to you, please allow [place] to reiterate it for the purpose of our medical record. The patient was accompanied to the clinic by self.    History of Present Illness: Erica [name] is a 34 y.o. right-handed Caucasian female with anxiety, obsessive-compulsive disorder and tobacco use (trying to quit) presenting for evaluation of right arm paresthesias and pain.    Starting in March 2016, she was helping her fiance move a 400lb piece of furniture up the stairs and felt something in her back pop and felt very nauseous.  She developed immediate tingling sensation over her mid-back, right arm and neck and constant shooting and dull pain.  She went to urgent care and recommended using muscle relaxants.  Symptoms improved some with muscle relaxants, but pain has been persistent.  She had plain films of the thoracic spine which showed mild scoliosis.  MRI cervical spine was essentially normal.  She tried prednisone, hydrocodone, diclofenac, flexeril, and tramadol. She takes flexeril 20mg  at bedtime, tramadol 50mg , and diclofenac 75mg  currently.  In addition to pain and paresthesias, she complains of weakness of her hand and drops things frequently.  The numbness/tingling is mostly over the forearm and finger tips.  Prolonged activity with her right arm can exacerbate pain and makes it worse.  Pain is worse in her collar bone, described as "bones hurting".   Out-side paper records, electronic medical record, and images have been reviewed where available and summarized as:  MRI cervical spine wo contrast 10/06/2014:  Minor straightening of the normal cervical lordosis, nonspecific. No focal abnormality of the cervical  spine.  CT head 02/19/2009:  Negative  Lab Results  Component Value Date   TSH 2.090 05/12/2014    Past Medical History  Diagnosis Date  . Irregular heart beat     Past Surgical History  Procedure Laterality Date  . Appendectomy    . Dilation and curettage of uterus       Medications:  Outpatient Encounter Prescriptions as of 01/15/2015  Medication Sig  . cyclobenzaprine (FLEXERIL) 10 MG tablet Take 1 tablet (10 mg total) by mouth 3 (three) times daily as needed for muscle spasms. Or 2 at bedtime  . diclofenac (VOLTAREN) 75 MG EC tablet Take 1 tablet (75 mg total) by mouth 2 (two) times daily.  Marland Kitchen etonogestrel (NEXPLANON) 68 MG IMPL implant 1 each by Subdermal route once.  . Pediatric Multiple Vit-C-FA (PEDIATRIC MULTIVITAMIN) chewable tablet Chew 1 tablet by mouth daily.  . sertraline (ZOLOFT) 100 MG tablet 2 po qd  . sertraline (ZOLOFT) 100 MG tablet TAKE ONE & ONE-HALF TABLETS BY MOUTH ONCE DAILY  . traMADol (ULTRAM) 50 MG tablet Take 1 tablet (50 mg total) by mouth 4 (four) times daily as needed for moderate pain.  . varenicline (CHANTIX) 1 MG tablet Take 1 tablet (1 mg total) by mouth 2 (two) times daily.   No facility-administered encounter medications on file as of 01/15/2015.     Allergies:  Allergies  Allergen Reactions  . Amoxicillin   . Effexor [Venlafaxine]     Family History: Family History  Problem Relation Age of Onset  . Cancer Maternal Grandmother 21    ovarian, and uterine  . Cancer Mother  30    ovarian and Breast    Social History: Social History  Substance Use Topics  . Smoking status: Current Every Day Smoker -- 0.25 packs/day    Types: Cigarettes    Last Attempt to Quit: 01/15/2013  . Smokeless tobacco: Never Used  . Alcohol Use: No   Social History   Social History Narrative   Lives with fiancee and 2 children in a one story home.  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  Education: currently in 60rd year of college.    Review  of Systems:  CONSTITUTIONAL: No fevers, chills, night sweats, or weight loss.   EYES: No visual changes or eye pain ENT: No hearing changes.  No history of nose bleeds.   RESPIRATORY: No cough, wheezing and shortness of breath.   CARDIOVASCULAR: Negative for chest pain, and palpitations.   GI: Negative for abdominal discomfort, blood in stools or black stools.  No recent change in bowel habits.   GU:  No history of incontinence.   MUSCLOSKELETAL: No history of joint pain or swelling.  +myalgias.   SKIN: Negative for lesions, rash, and itching.   HEMATOLOGY/ONCOLOGY: Negative for prolonged bleeding, bruising easily, and swollen nodes.  No history of cancer.   ENDOCRINE: Negative for cold or heat intolerance, polydipsia or goiter.   PSYCH:  +depression or anxiety symptoms.   NEURO: As Above.   Vital Signs:  BP 110/70 mmHg  Pulse 89  Ht 5\' 9"  (1.753 m)  Wt 208 lb 3 oz (94.433 kg)  BMI 30.73 kg/m2  SpO2 97%10   General Medical Exam:   General:  Well appearing, comfortable.   Eyes/ENT: see cranial nerve examination.   Neck: No masses appreciated.  Full range of motion without tenderness.  No carotid bruits. Respiratory:  Clear to auscultation, good air entry bilaterally.   Cardiac:  Regular rate and rhythm, no murmur.   Extremities:  No deformities, edema, or skin discoloration.  Skin:  No rashes or lesions.  Neurological Exam: MENTAL STATUS including orientation to time, place, person, recent and remote memory, attention span and concentration, language, and fund of knowledge is normal.  Speech is not dysarthric.  CRANIAL NERVES: II:  No visual field defects.  Unremarkable fundi.   III-IV-VI: Pupils equal round and reactive to light.  Normal conjugate, extra-ocular eye movements in all directions of gaze.  No nystagmus.  No ptosis.   V:  Normal facial sensation.   VII:  Normal facial symmetry and movements.   VIII:  Normal hearing and vestibular function.   IX-X:  Normal palatal  movement.   XI:  Normal shoulder shrug and head rotation.   XII:  Normal tongue strength and range of motion, no deviation or fasciculation.  MOTOR:  Marked give-way weakness of the right upper and lower extremity, although all muscle groups are antigravity and able to resist.  Motor strength of the left upper and lower extremities of 5/5.  No atrophy, fasciculations or abnormal movements.  No pronator drift.  Tone is normal and muscle bulk preserved throughout.    MSRs:  Right                                                                 Left brachioradialis 2+  brachioradialis  2+  biceps 2+  biceps 2+  triceps 2+  triceps 2+  patellar 1+  patellar 2+  ankle jerk 2+  ankle jerk 2+  Hoffman no  Hoffman no  plantar response down  plantar response down   SENSORY:  Normal and symmetric perception of light touch, pinprick, vibration, and proprioception.  Romberg's sign absent.   COORDINATION/GAIT: Normal finger-to- nose-finger and heel-to-shin.  Intact rapid alternating movements bilaterally.  Able to rise from a chair without using arms.  Gait narrow based and stable. Tandem and stressed gait intact.    IMPRESSION: Erica [name] is a 34 year-old female referred for evaluation of right sided neck pain/paresthesias radiating into the entire right upper extremity, which started in March while trying to move furniture. Her MRI cervical spine was personally reviewed and does not show evidence of nerve impingement.  Due to marked give-way weakness on the right upper and lower extremity, I cannot make any meaningful conclusions to help localize her symptoms. Fortunately, muscle bulk is preserved.   NCS/EMG of the right upper extremity will be obtained to evaluate her brachial plexus.   PLAN/RECOMMENDATIONS:  1.  NCS/EMG of the right upper extremity - brachial plexus protocol 2.  Start gabapentin 300mg  at bedtime x 1 week and increase to 300mg  BID.  Common side effects discussed 3.  Consider imaging of  the brachial plexus pending the above results  Return to clinic in 3 months.   The duration of this appointment visit was 45 minutes of face-to-face time with the patient.  Greater than 50% of this time was spent in counseling, explanation of diagnosis, planning of further management, and coordination of care.   Thank you for allowing me to participate in patient's care.  If I can answer any additional questions, I would be pleased to do so.    Sincerely,    Yareni Creps K. Posey Pronto, DO

## 2015-01-19 ENCOUNTER — Telehealth: Payer: Self-pay

## 2015-01-19 MED ORDER — BUPROPION HCL ER (XL) 150 MG PO TB24: 150.0000 mg | ORAL_TABLET | Freq: Every day | ORAL | Status: DC

## 2015-01-19 NOTE — Telephone Encounter (Signed)
Prior Authorization denied for Chantix   Can only get a 6 month supply in a year and she has already used that

## 2015-01-19 NOTE — Telephone Encounter (Signed)
Have her switch to Wellbutrin 150 mg daily for 2 weeks. At the end of that time it should be increased to 300 mg have her call and I will send in that prescription.

## 2015-01-19 NOTE — Telephone Encounter (Signed)
Stp and she states she just picked it up from the pharmacy and the insurance had denied it initially because the starter pack had been ordered but when a regular refill was sent in it was covered.

## 2015-02-01 ENCOUNTER — Ambulatory Visit (INDEPENDENT_AMBULATORY_CARE_PROVIDER_SITE_OTHER): Payer: Medicaid Other | Admitting: Neurology

## 2015-02-01 DIAGNOSIS — M79601 Pain in right arm: Secondary | ICD-10-CM

## 2015-02-01 DIAGNOSIS — R29898 Other symptoms and signs involving the musculoskeletal system: Secondary | ICD-10-CM

## 2015-02-01 DIAGNOSIS — R202 Paresthesia of skin: Secondary | ICD-10-CM

## 2015-02-01 NOTE — Procedures (Signed)
[facility]  [address]  [place], [place] [address] Tel: [phone] Fax:  [phone] Test Date:  02/01/2015  Patient: Erica [name] DOB: [birth year] Physician: Narda Amber  Sex: Female Height: 5\' 9"  Ref Phys: Narda Amber  ID#: [id number] Temp: 34.2 Technician: M. Dean   Patient Complaints: This is a 34 year old female presenting for evaluation of right shoulder pain radiating into her arm.  NCV & EMG Findings: Extensive electrodiagnostic testing of the right upper extremity and additional studies left shows: 1. Right median, ulnar, radial, and lateral antebrachial cutaneous sensory nerves are within normal limits. Bilateral medial antebrachial cutaneous sensory responses are symmetric. 2. Right median and ulnar motor responses are within normal limits. 3. There is no evidence of active or chronic motor axon loss changes affecting any of the tested muscles.  Impression: This is a normal study of the right upper extremity. In particular, there is no evidence of a cervical radiculopathy or brachial plexopathy.   _____________________________ Narda Amber, D.O.    Nerve Conduction Studies Anti Sensory Summary Table   Stim Site NR Peak (ms) Norm Peak (ms) P-T Amp (V) Norm P-T Amp  Right Lat Ante Brach Cutan Anti Sensory (Lat Forearm)  34.2C  Lat Biceps    2.5 <2.9 14.7 >14  Left Med Ante Brach Cutan Anti Sensory (Med Forearm)  34.2C  Elbow    3.0  5.2   Right Med Ante Brach Cutan Anti Sensory (Med Forearm)  34.2C  Elbow    2.2  8.0   Right Median Anti Sensory (2nd Digit)  34.2C  Wrist    3.1 <3.4 34.8 >20  Right Ulnar Anti Sensory (5th Digit)  34.2C  Wrist    3.1 <3.1 46.8 >12  Right Radial Anti Sensory (Base 1st Digit)  33.3C  Wrist    2.3 <2.8 41.1 >14   Motor Summary Table   Stim Site NR Onset (ms) Norm Onset (ms) O-P Amp (mV) Norm O-P Amp Site1 Site2 Delta-0 (ms) Dist (cm) Vel (m/s) Norm Vel (m/s)  Right Median Motor (Abd Poll Brev)   34.2C  Wrist    3.0 <3.9 9.3 >6 Elbow Wrist 4.3 23.0 53 >50  Elbow    7.3  8.6         Right Ulnar Motor (Abd Dig Minimi)  34.2C  Wrist    3.0 <3.1 8.8 >7 B Elbow Wrist 3.3 19.0 58 >50  B Elbow    6.3  8.7  A Elbow B Elbow 1.6 10.0 62 >50  A Elbow    7.9  8.0          EMG   Side Muscle Ins Act Fibs Psw Fasc Number Recrt Dur Dur. Amp Amp. Poly Poly. Comment  Right 1stDorInt Nml Nml Nml Nml Nml Nml Nml Nml Nml Nml Nml Nml N/A  Right Ext Indicis Nml Nml Nml Nml Nml Nml Nml Nml Nml Nml Nml Nml N/A  Right PronatorTeres Nml Nml Nml Nml Nml Nml Nml Nml Nml Nml Nml Nml N/A  Right Biceps Nml Nml Nml Nml Nml Nml Nml Nml Nml Nml Nml Nml N/A  Right Triceps Nml Nml Nml Nml Nml Nml Nml Nml Nml Nml Nml Nml N/A  Right Deltoid Nml Nml Nml Nml Nml Nml Nml Nml Nml Nml Nml Nml N/A  Right Cervical Parasp Low Nml Nml Nml Nml Nml Nml Nml Nml Nml Nml Nml Nml N/A  Right Infraspinatus Nml Nml Nml Nml Nml Nml Nml Nml Nml Nml Nml Nml  N/A      Waveforms:

## 2015-05-03 ENCOUNTER — Encounter: Payer: Self-pay | Admitting: Family Medicine

## 2015-05-03 ENCOUNTER — Ambulatory Visit (INDEPENDENT_AMBULATORY_CARE_PROVIDER_SITE_OTHER): Payer: Medicaid Other | Admitting: Family Medicine

## 2015-05-03 VITALS — BP 104/73 | HR 76 | Temp 97.8°F | Ht 69.0 in | Wt 218.0 lb

## 2015-05-03 DIAGNOSIS — Z23 Encounter for immunization: Secondary | ICD-10-CM | POA: Diagnosis not present

## 2015-05-03 DIAGNOSIS — S0191XA Laceration without foreign body of unspecified part of head, initial encounter: Secondary | ICD-10-CM | POA: Diagnosis not present

## 2015-05-03 MED ORDER — NITROGLYCERIN 0.2 MG/HR TD PT24
MEDICATED_PATCH | TRANSDERMAL | Status: DC
Start: 2015-05-03 — End: 2016-10-12

## 2015-05-03 MED ORDER — TRAMADOL HCL 50 MG PO TABS: 50.0000 mg | ORAL_TABLET | Freq: Four times a day (QID) | ORAL | Status: DC | PRN

## 2015-05-03 NOTE — Progress Notes (Signed)
   Subjective:    Patient ID: Erica [name], female    DOB: 10/07/1980, 35 y.o.   MRN: [id number]  HPI Patient here today for a cut on her forehead that happened on Saturday. She is not up to date on Tetanus and she is still having a headache.      Patient Active Problem List   Diagnosis Date Noted  . Cervical disc disorder with radiculopathy of cervical region 09/24/2014  . GAD (generalized anxiety disorder) 05/02/2013  . OCD (obsessive compulsive disorder) 05/02/2013  . Irregular heart beat    Outpatient Encounter Prescriptions as of 05/03/2015  Medication Sig  . cyclobenzaprine (FLEXERIL) 10 MG tablet Take 1 tablet (10 mg total) by mouth 3 (three) times daily as needed for muscle spasms. Or 2 at bedtime  . diclofenac (VOLTAREN) 75 MG EC tablet Take 1 tablet (75 mg total) by mouth 2 (two) times daily.  Marland Kitchen etonogestrel (NEXPLANON) 68 MG IMPL implant 1 each by Subdermal route once.  . gabapentin (NEURONTIN) 300 MG capsule Take one tablet at bedtime for one week then increase to one tablet twice daily  . Pediatric Multiple Vit-C-FA (PEDIATRIC MULTIVITAMIN) chewable tablet Chew 1 tablet by mouth daily.  . sertraline (ZOLOFT) 100 MG tablet TAKE ONE & ONE-HALF TABLETS BY MOUTH ONCE DAILY  . varenicline (CHANTIX) 1 MG tablet Take 1 tablet (1 mg total) by mouth 2 (two) times daily.  . [DISCONTINUED] sertraline (ZOLOFT) 100 MG tablet 2 po qd  . [DISCONTINUED] buPROPion (WELLBUTRIN XL) 150 MG 24 hr tablet Take 1 tablet (150 mg total) by mouth daily.  . [DISCONTINUED] traMADol (ULTRAM) 50 MG tablet Take 1 tablet (50 mg total) by mouth 4 (four) times daily as needed for moderate pain.   No facility-administered encounter medications on file as of 05/03/2015.      Review of Systems  Constitutional: Negative.   Eyes: Negative.   Respiratory: Negative.   Cardiovascular: Negative.   Gastrointestinal: Negative.   Endocrine: Negative.   Genitourinary: Negative.   Musculoskeletal: Positive for  arthralgias (right shoulder pain).  Skin: Positive for wound (forrhead).  Allergic/Immunologic: Negative.   Neurological: Positive for headaches.  Hematological: Negative.   Psychiatric/Behavioral: Negative.        Objective:   Physical Exam  Constitutional: She is oriented to person, place, and time. She appears well-developed and well-nourished.  Cardiovascular: Normal rate.   Pulmonary/Chest: Effort normal and breath sounds normal.  Neurological: She is alert and oriented to person, place, and time.  Skin:  Scratch at hairline and middle of for head. No evidence of cellulitis or infection at appears that it did not need suturing.   BP 104/73 mmHg  Pulse 76  Temp(Src) 97.8 F (36.6 C) (Oral)  Ht 5\' 9"  (1.753 m)  Wt 218 lb (98.884 kg)  BMI 32.18 kg/m2        Assessment & Plan:  1. Laceration of head, initial encounter Laceration is healing. No evidence of infection or cellulitis. Neurologic exam is normal with no loss of consciousness. She is having some headache. May be part of postconcussion syndrome. Headache is not getting worse. Suggested ice pack and when necessary.   Wardell Honour MD- Td : Tetanus/diphtheria >7yo Preservative  free

## 2015-05-10 NOTE — Therapy (Signed)
[facility] [place], [place], [address] Phone: [phone]   Fax:  [phone]  Physical Therapy Treatment  Patient Details  Name: Erica [name] MRN: [id number] Date of Birth: [birth year] No Data Recorded  Encounter Date: 09/21/2014    Past Medical History  Diagnosis Date  . Irregular heart beat     Past Surgical History  Procedure Laterality Date  . Appendectomy    . Dilation and curettage of uterus      There were no vitals filed for this visit.  Visit Diagnosis:  Mid back pain - Plan: PT plan of care cert/re-cert  Right upper extremity numbness - Plan: PT plan of care cert/re-cert                                    PT Long Term Goals - 09/21/14 0946    PT LONG TERM GOAL #1   Title Evaluation only.               Problem List Patient Active Problem List   Diagnosis Date Noted  . Cervical disc disorder with radiculopathy of cervical region 09/24/2014  . GAD (generalized anxiety disorder) 05/02/2013  . OCD (obsessive compulsive disorder) 05/02/2013  . Irregular heart beat   PHYSICAL THERAPY DISCHARGE SUMMARY  Visits from Start of Care: 1  Current functional level related to goals / functional outcomes: Please see above.   Remaining deficits: Eval only.   Education / Equipment:  Plan: Patient agrees to discharge.  Patient goals were met. Patient is being discharged due to meeting the stated rehab goals.  ?????       Skylar Flynt, [place] MPT 05/10/2015, 5:13 PM  [facility] [address] [place], [place], [address] Phone: [phone]   Fax:  [phone]  Name: Erica [name] MRN: [id number] Date of Birth: [birth year]

## 2015-05-28 ENCOUNTER — Other Ambulatory Visit: Payer: Self-pay | Admitting: Family Medicine

## 2015-06-16 ENCOUNTER — Ambulatory Visit (INDEPENDENT_AMBULATORY_CARE_PROVIDER_SITE_OTHER): Payer: Medicaid Other | Admitting: Family Medicine

## 2015-06-16 ENCOUNTER — Encounter (INDEPENDENT_AMBULATORY_CARE_PROVIDER_SITE_OTHER): Payer: Self-pay

## 2015-06-16 ENCOUNTER — Encounter: Payer: Self-pay | Admitting: Family Medicine

## 2015-06-16 VITALS — BP 95/65 | HR 81 | Temp 97.9°F | Ht 69.0 in | Wt 213.5 lb

## 2015-06-16 DIAGNOSIS — J020 Streptococcal pharyngitis: Secondary | ICD-10-CM | POA: Diagnosis not present

## 2015-06-16 LAB — RAPID STREP SCREEN (MED CTR MEBANE ONLY): Strep Gp A Ag, IA W/Reflex: NEGATIVE

## 2015-06-16 LAB — CULTURE, GROUP A STREP

## 2015-06-16 MED ORDER — LEVOFLOXACIN 500 MG PO TABS: 500.0000 mg | ORAL_TABLET | Freq: Every day | ORAL | Status: DC

## 2015-06-16 NOTE — Progress Notes (Signed)
Subjective:  Patient ID: Erica [name], female    DOB: [birth year]  Age: 35 y.o. MRN: [id number]  CC: Sore Throat   HPI Erica [name] presents for 3 days of sore throat hurts too bad to swallow. No appetite. Subjective fever. No cough. No NVD.   History Erica [name] has a past medical history of Irregular heart beat.   She has past surgical history that includes Appendectomy and Dilation and curettage of uterus.   Her family history includes Bipolar disorder in her mother and sister; Cancer (age of onset: 89) in her maternal grandmother; Cancer (age of onset: 49) in her mother; Depression in her brother; Healthy in her son.She reports that she quit smoking about 2 years ago. Her smoking use included Cigarettes. She has a 10 pack-year smoking history. She has never used smokeless tobacco. She reports that she does not drink alcohol or use illicit drugs.    ROS Review of Systems  Constitutional: Positive for fever, chills, diaphoresis, appetite change and fatigue.  HENT: Positive for ear pain (bilateral), sore throat and trouble swallowing. Negative for congestion, hearing loss, postnasal drip and rhinorrhea.   Respiratory: Negative for cough, chest tightness and shortness of breath.   Cardiovascular: Negative for chest pain and palpitations.  Gastrointestinal: Negative for abdominal pain.  Musculoskeletal: Negative for arthralgias.  Skin: Negative for rash.    Objective:  BP 95/65 mmHg  Pulse 81  Temp(Src) 97.9 F (36.6 C) (Oral)  Ht 5\' 9"  (1.753 m)  Wt 213 lb 8 oz (96.843 kg)  BMI 31.51 kg/m2  BP Readings from Last 3 Encounters:  06/16/15 95/65  05/03/15 104/73  01/15/15 110/70    Wt Readings from Last 3 Encounters:  06/16/15 213 lb 8 oz (96.843 kg)  05/03/15 218 lb (98.884 kg)  01/15/15 208 lb 3 oz (94.433 kg)     Physical Exam  Constitutional: She appears well-developed and well-nourished.  HENT:  Head: Normocephalic and atraumatic.  Right Ear: Tympanic membrane  and external ear normal. No decreased hearing is noted.  Left Ear: External ear normal. A middle ear effusion is present. No decreased hearing is noted.  Nose: Mucosal edema present. Right sinus exhibits no frontal sinus tenderness. Left sinus exhibits no frontal sinus tenderness.  Mouth/Throat: Oropharyngeal exudate and posterior oropharyngeal erythema present. No tonsillar abscesses.  Neck: No Brudzinski's sign noted.  Pulmonary/Chest: Breath sounds normal. No respiratory distress.  Lymphadenopathy:       Head (right side): No preauricular adenopathy present.       Head (left side): No preauricular adenopathy present.       Right cervical: No superficial cervical adenopathy present.      Left cervical: No superficial cervical adenopathy present.     Lab Results  Component Value Date   WBC 10.7* 05/12/2014   HGB 14.2 05/12/2014   HCT 44.9 05/12/2014   PLT 233 01/03/2014   GLUCOSE 100* 07/15/2014   CHOL 222* 05/12/2014   TRIG 260* 05/12/2014   HDL 29* 05/12/2014   ALT 14 05/12/2014   AST 10 05/12/2014   NA 141 07/15/2014   K 4.1 07/15/2014   CL 101 07/15/2014   CREATININE 0.53* 07/15/2014   BUN 10 07/15/2014   CO2 21 07/15/2014   TSH 2.090 05/12/2014    Mr Cervical Spine Wo Contrast  10/06/2014  CLINICAL DATA:  Neck pain radiating to both shoulders for 5 months. No known injury. Uncontrolled motion. EXAM: MRI CERVICAL SPINE WITHOUT CONTRAST TECHNIQUE: Multiplanar, multisequence MR imaging of  the cervical spine was performed. No intravenous contrast was administered. COMPARISON:  None. FINDINGS: There is no evidence for disc degeneration, disc herniation, vertebral body abnormality, or paraspinous mass. Slight straightening of the normal cervical lordosis could be positional or due to mild spasm. Axial images through the individual disc spaces demonstrate no foraminal narrowing, spinal stenosis, for significant facet disease. Flow voids are maintained in both vertebral arteries.  Paravertebral soft tissues are unremarkable. No tonsillar herniation. No cord abnormality or intraspinal mass lesion. IMPRESSION: Minor straightening of the normal cervical lordosis, nonspecific. No focal abnormality of the cervical spine. Electronically Signed   By: Staci Righter M.D.   On: 10/06/2014 18:09    Assessment & Plan:   Erica [name] was seen today for sore throat.  Diagnoses and all orders for this visit:  Acute streptococcal pharyngitis -     Rapid strep screen (not at [facility])  Other orders -     levofloxacin (LEVAQUIN) 500 MG tablet; Take 1 tablet (500 mg total) by mouth daily.    Rest at home. Drink lots of cool clear liquids.  I have discontinued Erica [name]'s cyclobenzaprine and diclofenac. I am also having her start on levofloxacin. Additionally, I am having her maintain her pediatric multivitamin, etonogestrel, varenicline, gabapentin, traMADol, nitroGLYCERIN, and sertraline.  Meds ordered this encounter  Medications  . levofloxacin (LEVAQUIN) 500 MG tablet    Sig: Take 1 tablet (500 mg total) by mouth daily.    Dispense:  7 tablet    Refill:  0     Follow-up: Return if symptoms worsen or fail to improve.  Claretta Fraise, M.D.

## 2015-06-16 NOTE — Patient Instructions (Signed)
Rest at home. Drink lots of cool clear liquids.

## 2015-07-06 ENCOUNTER — Other Ambulatory Visit: Payer: Self-pay | Admitting: Family Medicine

## 2015-09-23 ENCOUNTER — Ambulatory Visit (INDEPENDENT_AMBULATORY_CARE_PROVIDER_SITE_OTHER): Payer: Medicaid Other | Admitting: Nurse Practitioner

## 2015-09-23 ENCOUNTER — Encounter: Payer: Self-pay | Admitting: Nurse Practitioner

## 2015-09-23 VITALS — BP 106/67 | HR 82 | Temp 97.1°F | Ht 69.0 in | Wt 222.0 lb

## 2015-09-23 DIAGNOSIS — F411 Generalized anxiety disorder: Secondary | ICD-10-CM

## 2015-09-23 DIAGNOSIS — F429 Obsessive-compulsive disorder, unspecified: Secondary | ICD-10-CM | POA: Diagnosis not present

## 2015-09-23 MED ORDER — SERTRALINE HCL 100 MG PO TABS: 100.0000 mg | ORAL_TABLET | Freq: Every day | ORAL | 5 refills | Status: DC

## 2015-09-23 NOTE — Progress Notes (Signed)
   Subjective:    Patient ID: Erica [name], female    DOB: [birth year], 35 y.o.   MRN: [id number]  HPI  Patient in today for follow up. Current medical problem include: - Gad and OCD- she is currently on zoloft which is working well fro her- does not completely take away her OCD tendencies but makes them a little more tolerable. No side effects from medication. - Cervical disc disease- she was on neurotin but is no longer taking. She needs pT but insurance will not pay for it. She is doing her own exercises and stretches at home which helps.  Review of Systems  Constitutional: Negative.   HENT: Negative.   Respiratory: Negative.   Cardiovascular: Negative.   Genitourinary: Negative.   Psychiatric/Behavioral: Negative.   All other systems reviewed and are negative.      Objective:   Physical Exam  Constitutional: She is oriented to person, place, and time. She appears well-developed and well-nourished.  Cardiovascular: Normal rate, regular rhythm and normal heart sounds.   Pulmonary/Chest: Effort normal.  Abdominal: Soft. Bowel sounds are normal.  Neurological: She is alert and oriented to person, place, and time.  Skin: Skin is warm.  Psychiatric: She has a normal mood and affect. Her behavior is normal. Judgment and thought content normal.   BP 106/67   Pulse 82   Temp 97.1 F (36.2 C) (Oral)   Ht 5\' 9"  (1.753 m)   Wt 222 lb (100.7 kg)   BMI 32.78 kg/m        Assessment & Plan:   1. GAD (generalized anxiety disorder)   2. OCD (obsessive compulsive disorder)    Stress management Diet and exercise encouraged Meds ordered this encounter  Medications  . sertraline (ZOLOFT) 100 MG tablet    Sig: Take 1 tablet (100 mg total) by mouth daily.    Dispense:  45 tablet    Refill:  5    Order Specific Question:   Supervising Provider    Answer:   Eustaquio Maize [[id number]]    Mary-Margaret Hassell Done, FNP

## 2015-09-23 NOTE — Patient Instructions (Signed)
Stress and Stress Management Stress is a normal reaction to life events. It is what you feel when life demands more than you are used to or more than you can handle. Some stress can be useful. For example, the stress reaction can help you catch the last bus of the day, study for a test, or meet a deadline at work. But stress that occurs too often or for too long can cause problems. It can affect your emotional health and interfere with relationships and normal daily activities. Too much stress can weaken your immune system and increase your risk for physical illness. If you already have a medical problem, stress can make it worse. CAUSES  All sorts of life events may cause stress. An event that causes stress for one person may not be stressful for another person. Major life events commonly cause stress. These may be positive or negative. Examples include losing your job, moving into a new home, getting married, having a baby, or losing a loved one. Less obvious life events may also cause stress, especially if they occur day after day or in combination. Examples include working long hours, driving in traffic, caring for children, being in debt, or being in a difficult relationship. SIGNS AND SYMPTOMS Stress may cause emotional symptoms including, the following:  Anxiety. This is feeling worried, afraid, on edge, overwhelmed, or out of control.  Anger. This is feeling irritated or impatient.  Depression. This is feeling sad, down, helpless, or guilty.  Difficulty focusing, remembering, or making decisions. Stress may cause physical symptoms, including the following:   Aches and pains. These may affect your head, neck, back, stomach, or other areas of your body.  Tight muscles or clenched jaw.  Low energy or trouble sleeping. Stress may cause unhealthy behaviors, including the following:   Eating to feel better (overeating) or skipping meals.  Sleeping too little, too much, or both.  Working  too much or putting off tasks (procrastination).  Smoking, drinking alcohol, or using drugs to feel better. DIAGNOSIS  Stress is diagnosed through an assessment by your health care provider. Your health care provider will ask questions about your symptoms and any stressful life events.Your health care provider will also ask about your medical history and may order blood tests or other tests. Certain medical conditions and medicine can cause physical symptoms similar to stress. Mental illness can cause emotional symptoms and unhealthy behaviors similar to stress. Your health care provider may refer you to a mental health professional for further evaluation.  TREATMENT  Stress management is the recommended treatment for stress.The goals of stress management are reducing stressful life events and coping with stress in healthy ways.  Techniques for reducing stressful life events include the following:  Stress identification. Self-monitor for stress and identify what causes stress for you. These skills may help you to avoid some stressful events.  Time management. Set your priorities, keep a calendar of events, and learn to say "no." These tools can help you avoid making too many commitments. Techniques for coping with stress include the following:  Rethinking the problem. Try to think realistically about stressful events rather than ignoring them or overreacting. Try to find the positives in a stressful situation rather than focusing on the negatives.  Exercise. Physical exercise can release both physical and emotional tension. The key is to find a form of exercise you enjoy and do it regularly.  Relaxation techniques. These relax the body and mind. Examples include yoga, meditation, tai chi, biofeedback, deep  breathing, progressive muscle relaxation, listening to music, being out in nature, journaling, and other hobbies. Again, the key is to find one or more that you enjoy and can do  regularly.  Healthy lifestyle. Eat a balanced diet, get plenty of sleep, and do not smoke. Avoid using alcohol or drugs to relax.  Strong support network. Spend time with family, friends, or other people you enjoy being around.Express your feelings and talk things over with someone you trust. Counseling or talktherapy with a mental health professional may be helpful if you are having difficulty managing stress on your own. Medicine is typically not recommended for the treatment of stress.Talk to your health care provider if you think you need medicine for symptoms of stress. HOME CARE INSTRUCTIONS  Keep all follow-up visits as directed by your health care provider.  Take all medicines as directed by your health care provider. SEEK MEDICAL CARE IF:  Your symptoms get worse or you start having new symptoms.  You feel overwhelmed by your problems and can no longer manage them on your own. SEEK IMMEDIATE MEDICAL CARE IF:  You feel like hurting yourself or someone else.   This information is not intended to replace advice given to you by your health care provider. Make sure you discuss any questions you have with your health care provider.   Document Released: 08/09/2000 Document Revised: 03/06/2014 Document Reviewed: 10/08/2012 Elsevier Interactive Patient Education 2016 Else[employer] Inc.  

## 2016-01-11 ENCOUNTER — Encounter: Payer: Self-pay | Admitting: Family

## 2016-01-11 ENCOUNTER — Ambulatory Visit (INDEPENDENT_AMBULATORY_CARE_PROVIDER_SITE_OTHER): Payer: Medicaid Other | Admitting: Family

## 2016-01-11 VITALS — BP 122/84 | HR 80 | Temp 97.5°F | Ht 69.0 in | Wt 215.0 lb

## 2016-01-11 DIAGNOSIS — R112 Nausea with vomiting, unspecified: Secondary | ICD-10-CM

## 2016-01-11 DIAGNOSIS — R69 Illness, unspecified: Secondary | ICD-10-CM

## 2016-01-11 LAB — PREGNANCY, URINE: Preg Test, Ur: NEGATIVE

## 2016-01-11 MED ORDER — ONDANSETRON HCL 4 MG PO TABS: 4.0000 mg | ORAL_TABLET | Freq: Three times a day (TID) | ORAL | 0 refills | Status: DC | PRN

## 2016-01-11 NOTE — Patient Instructions (Signed)
Nausea and Vomiting, Adult Introduction Feeling sick to your stomach (nausea) means that your stomach is upset or you feel like you have to throw up (vomit). Feeling more and more sick to your stomach can lead to throwing up. Throwing up happens when food and liquid from your stomach are thrown up and out the mouth. Throwing up can make you feel weak and cause you to get dehydrated. Dehydration can make you tired and thirsty, make you have a dry mouth, and make it so you pee (urinate) less often. Older adults and people with other diseases or a weak defense system (immune system) are at higher risk for dehydration. If you feel sick to your stomach or if you throw up, it is important to follow instructions from your doctor about how to take care of yourself. Follow these instructions at home: Eating and drinking Follow these instructions as told by your doctor:  Take an oral rehydration solution (ORS). This is a drink that is sold at pharmacies and stores.  Drink clear fluids in small amounts as you are able, such as:  Water.  Ice chips.  Diluted fruit juice.  Low-calorie sports drinks.  Eat bland, easy-to-digest foods in small amounts as you are able, such as:  Bananas.  Applesauce.  Rice.  Low-fat (lean) meats.  Toast.  Crackers.  Avoid fluids that have a lot of sugar or caffeine in them.  Avoid alcohol.  Avoid spicy or fatty foods. General instructions  Drink enough fluid to keep your pee (urine) clear or pale yellow.  Wash your hands often. If you cannot use soap and water, use hand sanitizer.  Make sure that all people in your home wash their hands well and often.  Take over-the-counter and prescription medicines only as told by your doctor.  Rest at home while you get better.  Watch your condition for any changes.  Breathe slowly and deeply when you feel sick to your stomach.  Keep all follow-up visits as told by your doctor. This is important. Contact a  doctor if:  You have a fever.  You cannot keep fluids down.  Your symptoms get worse.  You have new symptoms.  You feel sick to your stomach for more than two days.  You feel light-headed or dizzy.  You have a headache.  You have muscle cramps. Get help right away if:  You have pain in your chest, neck, arm, or jaw.  You feel very weak or you pass out (faint).  You throw up again and again.  You see blood in your throw-up.  Your throw-up looks like black coffee grounds.  You have bloody or black poop (stools) or poop that look like tar.  You have a very bad headache, a stiff neck, or both.  You have a rash.  You have very bad pain, cramping, or bloating in your belly (abdomen).  You have trouble breathing.  You are breathing very quickly.  Your heart is beating very quickly.  Your skin feels cold and clammy.  You feel confused.  You have pain when you pee.  You have signs of dehydration, such as:  Dark pee, hardly any pee, or no pee.  Cracked lips.  Dry mouth.  Sunken eyes.  Sleepiness.  Weakness. These symptoms may be an emergency. Do not wait to see if the symptoms will go away. Get medical help right away. Call your local emergency services (911 in the [place] Do not drive yourself to the hospital.  This information   is not intended to replace advice given to you by your health care provider. Make sure you discuss any questions you have with your health care provider. Document Released: 08/02/2007 Document Revised: 09/03/2015 Document Reviewed: 10/20/2014  2017 Elsevier  

## 2016-01-11 NOTE — Progress Notes (Signed)
   Subjective:    Patient ID: Erica [name], female    DOB: 1980/04/17, 35 y.o.   MRN: [id number]  PT presents to the office today with new onset of nausea. Pt has nexplanon, but is due to have replaced this month. Pt is worried she is pregnant, because she had the same nausea with her previous children.  Emesis   This is a new problem. Episode onset: 10 days. The problem occurs 5 to 10 times per day. The problem has been unchanged. The emesis has an appearance of stomach contents. There has been no fever. Pertinent negatives include no abdominal pain, chest pain, chills, coughing, diarrhea, dizziness, fever, headaches, myalgias, sweats, URI or weight loss. Associated symptoms comments: Breast tenderness . She has tried increased fluids for the symptoms. The treatment provided no relief.      Review of Systems  Constitutional: Negative for chills, fever and weight loss.  Respiratory: Negative for cough.   Cardiovascular: Negative for chest pain.  Gastrointestinal: Positive for vomiting. Negative for abdominal pain and diarrhea.  Musculoskeletal: Negative for myalgias.  Neurological: Negative for dizziness and headaches.  All other systems reviewed and are negative.      Objective:   Physical Exam  Constitutional: She is oriented to person, place, and time. She appears well-developed and well-nourished. No distress.  HENT:  Head: Normocephalic and atraumatic.  Eyes: Pupils are equal, round, and reactive to light.  Neck: Normal range of motion. Neck supple. No thyromegaly present.  Cardiovascular: Normal rate, regular rhythm, normal heart sounds and intact distal pulses.   No murmur heard. Pulmonary/Chest: Effort normal and breath sounds normal. No respiratory distress. She has no wheezes.  Abdominal: Soft. Bowel sounds are normal. She exhibits no distension. There is no tenderness.  Musculoskeletal: Normal range of motion. She exhibits no edema or tenderness.  Neurological: She is  alert and oriented to person, place, and time. She has normal reflexes. No cranial nerve deficit.  Skin: Skin is warm and dry.  Psychiatric: She has a normal mood and affect. Her behavior is normal. Judgment and thought content normal.  Vitals reviewed.     BP 122/84   Pulse 80   Temp 97.5 F (36.4 C) (Oral)   Ht 5\' 9"  (1.753 m)   Wt 215 lb (97.5 kg)   BMI 31.75 kg/m      Assessment & Plan:  1. Sickness - Pregnancy, urine  2. Nausea and vomiting, intractability of vomiting not specified, unspecified vomiting type -Force fluids -Bland diet -RTO if symptoms worsens or do not improve - ondansetron (ZOFRAN) 4 MG tablet; Take 1 tablet (4 mg total) by mouth every 8 (eight) hours as needed for nausea or vomiting.  Dispense: 20 tablet; Refill: 0  Evelina Dun, FNP

## 2016-03-16 ENCOUNTER — Encounter: Payer: Medicaid Other | Admitting: Advanced Practice Midwife

## 2016-03-17 ENCOUNTER — Encounter: Payer: Self-pay | Admitting: Family Medicine

## 2016-03-17 ENCOUNTER — Ambulatory Visit (HOSPITAL_COMMUNITY)
Admission: RE | Admit: 2016-03-17 | Discharge: 2016-03-17 | Disposition: A | Discharge status: Home / Self Care | Payer: Medicaid Other | Source: Ambulatory Visit | Attending: Family Medicine | Admitting: Family Medicine

## 2016-03-17 ENCOUNTER — Ambulatory Visit (INDEPENDENT_AMBULATORY_CARE_PROVIDER_SITE_OTHER): Payer: Medicaid Other | Admitting: Family Medicine

## 2016-03-17 VITALS — BP 113/80 | HR 85 | Temp 97.6°F | Ht 69.0 in | Wt 216.2 lb

## 2016-03-17 DIAGNOSIS — R51 Headache: Secondary | ICD-10-CM | POA: Diagnosis not present

## 2016-03-17 DIAGNOSIS — J358 Other chronic diseases of tonsils and adenoids: Secondary | ICD-10-CM | POA: Diagnosis not present

## 2016-03-17 DIAGNOSIS — R040 Epistaxis: Secondary | ICD-10-CM

## 2016-03-17 DIAGNOSIS — R519 Headache, unspecified: Secondary | ICD-10-CM

## 2016-03-17 LAB — CULTURE, GROUP A STREP

## 2016-03-17 LAB — RAPID STREP SCREEN (MED CTR MEBANE ONLY): Strep Gp A Ag, IA W/Reflex: NEGATIVE

## 2016-03-17 MED ORDER — PROMETHAZINE HCL 25 MG PO TABS: 12.5000 mg | ORAL_TABLET | Freq: Three times a day (TID) | ORAL | 0 refills | Status: DC | PRN

## 2016-03-17 MED ORDER — CLINDAMYCIN HCL 300 MG PO CAPS
300.0000 mg | ORAL_CAPSULE | Freq: Three times a day (TID) | ORAL | 0 refills | Status: DC
Start: 2016-03-17 — End: 2016-04-13

## 2016-03-17 MED ORDER — SUMATRIPTAN SUCCINATE 50 MG PO TABS: 50.0000 mg | ORAL_TABLET | ORAL | 0 refills | Status: DC | PRN

## 2016-03-17 NOTE — Addendum Note (Signed)
Addended by: Nigel Berthold C on: 03/17/2016 03:49 PM   Modules accepted: Orders

## 2016-03-17 NOTE — Patient Instructions (Signed)
Great to meet you!  Try nasal saline gel ( [place] brand is good and available at [employer]) 2-3 times daily for nosebleeds  Start clindamycin  I will work on a CT scan for your head to rule out serious underlying problems.

## 2016-03-17 NOTE — Addendum Note (Signed)
Addended by: Timmothy Euler on: 03/17/2016 05:45 PM   Modules accepted: Orders

## 2016-03-17 NOTE — Progress Notes (Addendum)
   HPI  Patient presents today here with a severe headache and nosebleed.  Patient explains that she's had a severe headache now for about 6 weeks. She states that she was seen in the emergency room last month and treated with several medicines with transient improvement and then return of the headache. She did not have a CT scan at that time. She states that the headaches been severe and persistent for about 2 weeks. Alternates between throbbing bilateral and behind her eyes radiating to the occipital area. She also has photophobia and nausea with it.  Patient also complains of severe nosebleeds daily last 2 weeks. They last 5-20 minutes, they're mostly left-sided nostril, however occasionally on the right side as well.  She also complains of ear and will irritation.  PMH: Smoking status noted ROS: Per HPI  Objective: BP 113/80   Pulse 85   Temp 97.6 F (36.4 C) (Oral)   Ht 5\' 9"  (1.753 m)   Wt 216 lb 3.2 oz (98.1 kg)   BMI 31.93 kg/m  Gen: NAD, alert, cooperative with exam HEENT: NCAT, small area of fresh blood in the left nostril on the septum, oropharynx clear and moist, TMs normal bilaterally, EOMI, PERRLA CV: RRR, good S1/S2, no murmur Resp: CTABL, no wheezes, non-labored Ext: No edema, warm Neuro: Alert and oriented, EOMI, PERRLA, strength 5/5 and sensation intact in all 4 extremities, decreased sensation on left side cranial nerve V testing in branches 2 and 3, his cranial nerves II through XII intact.  Assessment and plan:  # Intractable headache Unclear etiology, with decreased sensation in trigeminal nerve I have ordered a stat CT to rule out bleed. Patient does confirm this is the worst headache she is ever had, it also started suddenly and has been persistent now for about 2 weeks, improved a few weeks ago after ER visit and then returned. Six-week total duration.  # tonsil symmetry Unclear etiology, testing for strep today treating with clindamycin If no  improvement would recommend ENT visit  # Nosebleeds Likely dry mucous membranes Recommended nasal saline gel 2-3 times daily   If headache on improved in CT scan is normal I would recommend follow-up early next week for headache cocktail   Orders Placed This Encounter  Procedures  . CT Head Wo Contrast    Standing Status:   Future    Standing Expiration Date:   06/15/2017    Order Specific Question:   Reason for Exam (SYMPTOM  OR DIAGNOSIS REQUIRED)    Answer:   Severe HA, R/o bleed    Order Specific Question:   Is patient pregnant?    Answer:   No    Order Specific Question:   Preferred imaging location?    Answer:   [facility]    Meds ordered this encounter  Medications  . clindamycin (CLEOCIN) 300 MG capsule    Sig: Take 1 capsule (300 mg total) by mouth 3 (three) times daily.    Dispense:  30 capsule    Refill:  0    Laroy Apple, MD [facility] 03/17/2016, 3:26 PM   Report on CT Scan- WNL. Called and discussed with pt.  She has tried tramadol without help of HA. Zofran also not helping.  Phenergan and imitrex sent.  RTC early next week if not better for headache cocktail. (IM depo medrol, toradol, phenergan)  Laroy Apple, MD [facility] 03/17/2016, 5:44 PM

## 2016-03-18 LAB — CMP14+EGFR
ALK PHOS: 59 IU/L (ref 39–117)
ALT: 14 IU/L (ref 0–32)
AST: 14 IU/L (ref 0–40)
Albumin/Globulin Ratio: 1.6 (ref 1.2–2.2)
Albumin: 4.7 g/dL (ref 3.5–5.5)
BILIRUBIN TOTAL: 0.2 mg/dL (ref 0.0–1.2)
BUN/Creatinine Ratio: 12 (ref 9–23)
BUN: 10 mg/dL (ref 6–20)
CHLORIDE: 101 mmol/L (ref 96–106)
CO2: 25 mmol/L (ref 18–29)
Calcium: 9.7 mg/dL (ref 8.7–10.2)
Creatinine, Ser: 0.83 mg/dL (ref 0.57–1.00)
GFR calc Af Amer: 106 mL/min/{1.73_m2} (ref >59)
GFR calc non Af Amer: 92 mL/min/{1.73_m2} (ref >59)
GLUCOSE: 91 mg/dL (ref 65–99)
Globulin, Total: 3 g/dL (ref 1.5–4.5)
Potassium: 4.2 mmol/L (ref 3.5–5.2)
Sodium: 142 mmol/L (ref 134–144)
TOTAL PROTEIN: 7.7 g/dL (ref 6.0–8.5)

## 2016-03-18 LAB — CBC WITH DIFFERENTIAL/PLATELET
BASOS ABS: 0 10*3/uL (ref 0.0–0.2)
Basos: 1 %
EOS (ABSOLUTE): 0.1 10*3/uL (ref 0.0–0.4)
Eos: 1 %
Hematocrit: 41.1 % (ref 34.0–46.6)
Hemoglobin: 13.8 g/dL (ref 11.1–15.9)
IMMATURE GRANS (ABS): 0 10*3/uL (ref 0.0–0.1)
Immature Granulocytes: 0 %
LYMPHS: 41 %
Lymphocytes Absolute: 3.5 10*3/uL — ABNORMAL HIGH (ref 0.7–3.1)
MCH: 29.5 pg (ref 26.6–33.0)
MCHC: 33.6 g/dL (ref 31.5–35.7)
MCV: 88 fL (ref 79–97)
Monocytes Absolute: 0.6 10*3/uL (ref 0.1–0.9)
Monocytes: 7 %
NEUTROS ABS: 4.3 10*3/uL (ref 1.4–7.0)
Neutrophils: 50 %
PLATELETS: 313 10*3/uL (ref 150–379)
RBC: 4.68 x10E6/uL (ref 3.77–5.28)
RDW: 14 % (ref 12.3–15.4)
WBC: 8.6 10*3/uL (ref 3.4–10.8)

## 2016-03-19 LAB — CULTURE, GROUP A STREP: Strep A Culture: NEGATIVE

## 2016-03-20 ENCOUNTER — Encounter: Payer: Self-pay | Admitting: Nurse Practitioner

## 2016-03-20 ENCOUNTER — Ambulatory Visit (INDEPENDENT_AMBULATORY_CARE_PROVIDER_SITE_OTHER): Payer: Medicaid Other | Admitting: Nurse Practitioner

## 2016-03-20 VITALS — BP 106/73 | HR 77 | Temp 97.7°F | Ht 69.0 in | Wt 220.0 lb

## 2016-03-20 DIAGNOSIS — G43019 Migraine without aura, intractable, without status migrainosus: Secondary | ICD-10-CM

## 2016-03-20 MED ORDER — KETOROLAC TROMETHAMINE 60 MG/2ML IM SOLN
60.0000 mg | Freq: Once | INTRAMUSCULAR | Status: AC
  Administered 2016-03-20: 60 mg via INTRAMUSCULAR

## 2016-03-20 MED ORDER — METHYLPREDNISOLONE ACETATE 80 MG/ML IJ SUSP
80.0000 mg | Freq: Once | INTRAMUSCULAR | Status: AC
  Administered 2016-03-20: 80 mg via INTRAMUSCULAR

## 2016-03-20 MED ORDER — PROMETHAZINE HCL 25 MG/ML IJ SOLN
25.0000 mg | Freq: Once | INTRAMUSCULAR | Status: AC
  Administered 2016-03-20: 25 mg via INTRAVENOUS

## 2016-03-20 NOTE — Patient Instructions (Signed)
Recurrent Migraine Headache A migraine headache is very bad, throbbing pain on one or both sides of your head. Recurrent migraines keep coming back. Talk to your doctor about what things may bring on (trigger) your migraine headaches. Follow these instructions at home:  Only take medicines as told by your doctor.  Lie down in a dark, quiet room when you have a migraine.  Keep a journal to find out if certain things bring on migraine headaches. For example, write down:  What you eat and drink.  How much sleep you get.  Any change to your diet or medicines.  Lessen how much alcohol you drink.  Quit smoking if you smoke.  Get enough sleep.  Lessen any stress in your life.  Keep lights dim if bright lights bother you or make your migraines worse. Contact a doctor if:  Medicine does not help your migraines.  Your pain keeps coming back.  You have a fever. Get help right away if:  Your migraine becomes really bad.  You have a stiff neck.  You have trouble seeing.  Your muscles are weak, or you lose muscle control.  You lose your balance or have trouble walking.  You feel like you will pass out (faint), or you pass out.  You have really bad symptoms that are different than your first symptoms. This information is not intended to replace advice given to you by your health care provider. Make sure you discuss any questions you have with your health care provider. Document Released: 11/23/2007 Document Revised: 07/22/2015 Document Reviewed: 10/21/2012 Elsevier Interactive Patient Education  2017 [employer].

## 2016-03-20 NOTE — Progress Notes (Signed)
   Subjective:    Patient ID: Erica [name], female    DOB: Mar 08, 1980, 36 y.o.   MRN: [id number]  HPI  Patient comes in today c/o of intractable headache. SHe saw Dr. Wendi Snipes on 03/17/16 with same complaint- at that time he reviewed her head CT with her which was negative.  SHe comes in today still c/o headache. SHe rates it a 10/10 currently with occasional nose bleed. Positive for photophobia.  * she has a left tonsillar enlargement that is being treateed with antibiotics-  Patient is to let u know if does not go down over the next week and we will do ENT referral.  Review of Systems  Constitutional: Negative.   HENT: Negative.   Eyes: Positive for photophobia.  Respiratory: Negative.   Gastrointestinal: Positive for nausea.  Genitourinary: Negative.   Musculoskeletal: Negative.   Neurological: Positive for headaches.       Objective:   Physical Exam  Constitutional: She is oriented to person, place, and time. She appears well-developed and well-nourished. No distress.  HENT:  Bird egg size tonsillar enlargement on left.  Cardiovascular: Normal rate and regular rhythm.   Pulmonary/Chest: Effort normal and breath sounds normal.  Neurological: She is alert and oriented to person, place, and time. She has normal reflexes. No cranial nerve deficit.  Skin: Skin is warm.  Psychiatric: She has a normal mood and affect. Her behavior is normal. Judgment and thought content normal.   BP 106/73   Pulse 77   Temp 97.7 F (36.5 C) (Oral)   Ht 5\' 9"  (1.753 m)   Wt 220 lb (99.8 kg)   BMI 32.49 kg/m        Assessment & Plan:   1. Intractable migraine without aura and without status migrainosus    Meds ordered this encounter  Medications  . methylPREDNISolone acetate (DEPO-MEDROL) injection 80 mg  . ketorolac (TORADOL) injection 60 mg  . promethazine (PHENERGAN) injection 25 mg   Rest  In dark room Take imitrex as needed RTO prn Consulted with dr. Burt Knack, FNP

## 2016-03-23 ENCOUNTER — Encounter: Payer: Self-pay | Admitting: Family Medicine

## 2016-03-23 ENCOUNTER — Encounter: Payer: Self-pay | Admitting: Women's Health

## 2016-03-23 ENCOUNTER — Ambulatory Visit (INDEPENDENT_AMBULATORY_CARE_PROVIDER_SITE_OTHER): Payer: Medicaid Other | Admitting: Family Medicine

## 2016-03-23 ENCOUNTER — Ambulatory Visit (INDEPENDENT_AMBULATORY_CARE_PROVIDER_SITE_OTHER): Payer: Medicaid Other | Admitting: Women's Health

## 2016-03-23 VITALS — BP 105/70 | HR 82 | Temp 97.9°F | Ht 69.0 in | Wt 220.6 lb

## 2016-03-23 VITALS — BP 113/69 | HR 74 | Ht 69.0 in | Wt 220.0 lb

## 2016-03-23 DIAGNOSIS — J358 Other chronic diseases of tonsils and adenoids: Secondary | ICD-10-CM | POA: Diagnosis not present

## 2016-03-23 DIAGNOSIS — Z30017 Encounter for initial prescription of implantable subdermal contraceptive: Secondary | ICD-10-CM | POA: Insufficient documentation

## 2016-03-23 DIAGNOSIS — Z3202 Encounter for pregnancy test, result negative: Secondary | ICD-10-CM

## 2016-03-23 DIAGNOSIS — R51 Headache: Secondary | ICD-10-CM

## 2016-03-23 DIAGNOSIS — Z3049 Encounter for surveillance of other contraceptives: Secondary | ICD-10-CM

## 2016-03-23 DIAGNOSIS — R519 Headache, unspecified: Secondary | ICD-10-CM

## 2016-03-23 LAB — POCT URINE PREGNANCY: Preg Test, Ur: NEGATIVE

## 2016-03-23 NOTE — Progress Notes (Signed)
Erica [name] is a 36 y.o. year old G14P1102 Caucasian female here for Nexplanon removal and reinsertion.  She was given informed consent for removal and reinsertion of her Nexplanon. Her Nexplanon was placed 02/2013, No LMP recorded. Patient has had an implant., and her pregnancy test today was neg. Last pap 02/2015 @ [facility], was normal.   Risks/benefits/side effects of Nexplanon have been discussed and her questions have been answered.  Specifically, a failure rate of 02/998 has been reported, with an increased failure rate if pt takes [place] and/or antiseizure medicaitons.  Erica [name] is aware of the common side effect of irregular bleeding, which the incidence of decreases over time.  BP 113/69 (BP Location: Right Arm, Patient Position: Sitting, Cuff Size: Normal)   Pulse 74   Ht 5\' 9"  (1.753 m)   Wt 220 lb (99.8 kg)   BMI 32.49 kg/m  No LMP recorded. Patient has had an implant. Results for orders placed or performed in visit on 03/23/16 (from the past 24 hour(s))  POCT urine pregnancy   Collection Time: 03/23/16 11:10 AM  Result Value Ref Range   Preg Test, Ur Negative Negative     Appropriate time out taken. Nexplanon site identified.  Area prepped in usual sterile fashon. Two cc's of 2% lidocaine was used to anesthetize the area. A small stab incision was made right beside the implant on the distal portion.  The Nexplanon rod was grasped using hemostats and removed intact without difficulty.  The area was cleansed again with betadine and the Nexplanon was inserted per manufacturer's recommendations without difficulty.  Steri-strips and a pressure bandage was applied.  There was less than 3 cc blood loss. There were no complications.  The patient tolerated the procedure well.  She was instructed to keep the area clean and dry, remove pressure bandage in 24 hours, and keep insertion site covered with the steri-strips for 3-5 days.  She was given a card indicating date  Nexplanon was inserted and date it needs to be removed.   Follow-up PRN problems.  Tawnya Crook CNM, [facility] 03/23/2016 11:36 AM

## 2016-03-23 NOTE — Progress Notes (Signed)
   HPI  Patient presents today here with persistent headache.  The patient had several visits for this headache. She describes now sharp pain in the left retro-orbital area that radiates to the occipital area. She also describes decreased sensation/altered sensation of the left lower face.  She was sent for stat CT last Friday evening which was normal.  She returned our clinic on Monday and received IM Depo-Medrol, Toradol, and Phenergan.  She notes only mild improvement transiently of the headache is now returned. She had minimal improvement with Imitrex. Her mother accompanies her today an requested MRI.   PMH: Smoking status noted ROS: Per HPI  Objective: BP 105/70   Pulse 82   Temp 97.9 F (36.6 C) (Oral)   Ht 5\' 9"  (1.753 m)   Wt 220 lb 9.6 oz (100.1 kg)   BMI 32.58 kg/m  Gen: NAD, alert, cooperative with exam HEENT: NCATnares clear, oropharynx with enlarged left tonsil compared to the right. Picture taken last visit on 03/17/2016 is placed below. CV: RRR, good S1/S2, no murmur Resp: CTABL, no wheezes, non-labored Ext: No edema, warm Neuro: Alert and oriented, strength 5/5 and sensation intact in all 4 extremities, cranial nerve II through XII intact For altered sensation in V2 and V3 branches of the trigeminal nerve. Normal gait      Assessment and plan:  # Headache Persistent headache after 6+ weeks. She's had most significant improvement with Imitrex Most likely migrainous in etiology Given her constellation of symptoms as well as decreased sensation in 2 branches of the trigeminal nerve I recommended referral to neurology. Referral is placed, medications and evaluation  # tonsil asymmetry No improvement with clindamycin This does not appear necessarily to be a tonsil abscess as she is asymptomatic. Recommended watchful waiting for 2-3 weeks while she settles matter her headache and then evaluation with ENT if she has not had complete resolution or improvement  of the tonsil.      Orders Placed This Encounter  Procedures  . Ambulatory referral to Neurology    Referral Priority:   Urgent    Referral Type:   Consultation    Referral Reason:   Specialty Services Required    Requested Specialty:   Neurology    Number of Visits Requested:   [place], MD [facility] 03/23/2016, 5:18 PM

## 2016-03-23 NOTE — Patient Instructions (Signed)
Great to see you!  Try 100 mg of sumatriptan at once, you may take 1 additional 100 mg dose in 2 hours if needed ( 4 pills total).   We are working on a referral to Dr. Catalina Gravel.    Migraine Headache A migraine headache is a very strong throbbing pain on one side or both sides of your head. Migraines can also cause other symptoms. Talk with your doctor about what things may bring on (trigger) your migraine headaches. Follow these instructions at home: Medicines  Take over-the-counter and prescription medicines only as told by your doctor.  Do not drive or use heavy machinery while taking prescription pain medicine.  To prevent or treat constipation while you are taking prescription pain medicine, your doctor may recommend that you:  Drink enough fluid to keep your pee (urine) clear or pale yellow.  Take over-the-counter or prescription medicines.  Eat foods that are high in fiber. These include fresh fruits and vegetables, whole grains, and beans.  Limit foods that are high in fat and processed sugars. These include fried and sweet foods. Lifestyle  Avoid alcohol.  Do not use any products that contain nicotine or tobacco, such as cigarettes and e-cigarettes. If you need help quitting, ask your doctor.  Get at least 8 hours of sleep every night.  Limit your stress. General instructions  Keep a journal to find out what may bring on your migraines. For example, write down:  What you eat and drink.  How much sleep you get.  Any change in what you eat or drink.  Any change in your medicines.  If you have a migraine:  Avoid things that make your symptoms worse, such as bright lights.  It may help to lie down in a dark, quiet room.  Do not drive or use heavy machinery.  Ask your doctor what activities are safe for you.  Keep all follow-up visits as told by your doctor. This is important. Contact a doctor if:  You get a migraine that is different or worse than your  usual migraines. Get help right away if:  Your migraine gets very bad.  You have a fever.  You have a stiff neck.  You have trouble seeing.  Your muscles feel weak or like you cannot control them.  You start to lose your balance a lot.  You start to have trouble walking.  You pass out (faint). This information is not intended to replace advice given to you by your health care provider. Make sure you discuss any questions you have with your health care provider. Document Released: 11/23/2007 Document Revised: 09/03/2015 Document Reviewed: 08/02/2015 Elsevier Interactive Patient Education  2017 [employer].

## 2016-03-23 NOTE — Patient Instructions (Signed)
Keep the area clean and dry.  You can remove the big bandage in 24 hours, and the small steri-strip bandage in 3-5 days.  A back up method, such as condoms, should be used for two weeks. You may have irregular vaginal bleeding for the first 6 months after the Nexplanon is placed, then the bleeding usually lightens and it is possible that you may not have any periods.  If you have any concerns, please give [place] a call.    Etonogestrel implant What is this medicine? ETONOGESTREL (et oh noe JES trel) is a contraceptive (birth control) device. It is used to prevent pregnancy. It can be used for up to 3 years. COMMON BRAND NAME(S): Implanon, Nexplanon What should I tell my health care provider before I take this medicine? They need to know if you have any of these conditions: -abnormal vaginal bleeding -blood vessel disease or blood clots -cancer of the breast, cervix, or liver -depression -diabetes -gallbladder disease -headaches -heart disease or recent heart attack -high blood pressure -high cholesterol -kidney disease -liver disease -renal disease -seizures -tobacco smoker -an unusual or allergic reaction to etonogestrel, other hormones, anesthetics or antiseptics, medicines, foods, dyes, or preservatives -pregnant or trying to get pregnant -breast-feeding How should I use this medicine? This device is inserted just under the skin on the inner side of your upper arm by a health care professional. Talk to your pediatrician regarding the use of this medicine in children. Special care may be needed. What if I miss a dose? This does not apply. What may interact with this medicine? Do not take this medicine with any of the following medications: -amprenavir -bosentan -fosamprenavir This medicine may also interact with the following medications: -barbiturate medicines for inducing sleep or treating seizures -certain medicines for fungal infections like ketoconazole and  itraconazole -griseofulvin -medicines to treat seizures like carbamazepine, felbamate, oxcarbazepine, phenytoin, topiramate -modafinil -phenylbutazone -rifampin -some medicines to treat HIV infection like atazanavir, indinavir, lopinavir, nelfinavir, tipranavir, ritonavir -St. [name]'s wort What should I watch for while using this medicine? This product does not protect you against HIV infection (AIDS) or other sexually transmitted diseases. You should be able to feel the implant by pressing your fingertips over the skin where it was inserted. Contact your doctor if you cannot feel the implant, and use a non-hormonal birth control method (such as condoms) until your doctor confirms that the implant is in place. If you feel that the implant may have broken or become bent while in your arm, contact your healthcare provider. What side effects may I notice from receiving this medicine? Side effects that you should report to your doctor or health care professional as soon as possible: -allergic reactions like skin rash, itching or hives, swelling of the face, lips, or tongue -breast lumps -changes in emotions or moods -depressed mood -heavy or prolonged menstrual bleeding -pain, irritation, swelling, or bruising at the insertion site -scar at site of insertion -signs of infection at the insertion site such as fever, and skin redness, pain or discharge -signs of pregnancy -signs and symptoms of a blood clot such as breathing problems; changes in vision; chest pain; severe, sudden headache; pain, swelling, warmth in the leg; trouble speaking; sudden numbness or weakness of the face, arm or leg -signs and symptoms of liver injury like dark yellow or brown urine; general ill feeling or flu-like symptoms; light-colored stools; loss of appetite; nausea; right upper belly pain; unusually weak or tired; yellowing of the eyes or skin -unusual vaginal  bleeding, discharge -signs and symptoms of a stroke like  changes in vision; confusion; trouble speaking or understanding; severe headaches; sudden numbness or weakness of the face, arm or leg; trouble walking; dizziness; loss of balance or coordination Side effects that usually do not require medical attention (report to your doctor or health care professional if they continue or are bothersome): -acne -back pain -breast pain -changes in weight -dizziness -general ill feeling or flu-like symptoms -headache -irregular menstrual bleeding -nausea -sore throat -vaginal irritation or inflammation Where should I keep my medicine? This drug is given in a hospital or clinic and will not be stored at home.  2017 Elsevier/Gold Standard (2015-03-18 10:56:20)

## 2016-03-27 ENCOUNTER — Telehealth: Payer: Self-pay | Admitting: Family Medicine

## 2016-03-27 ENCOUNTER — Other Ambulatory Visit: Payer: Self-pay | Admitting: Family Medicine

## 2016-03-27 NOTE — Telephone Encounter (Signed)
Aware, no MRI at this time.

## 2016-03-27 NOTE — Telephone Encounter (Signed)
Pharmacy changed to [facility] in [place]

## 2016-03-27 NOTE — Telephone Encounter (Signed)
Pt notified of recommendation Verbalizes understanding 

## 2016-03-27 NOTE — Telephone Encounter (Signed)
Pt calling about an MRI? She has had a CT already and returned to clinic where you suggested she see neurology. That referral has not been placed. Can you place that please and verify no MRI needed at this time?

## 2016-03-27 NOTE — Telephone Encounter (Signed)
No MRI at this time, we had a discussion about this and I recommended referral rather than MRI at this time.   Laroy Apple, MD [facility] 03/27/2016, 5:03 PM

## 2016-03-28 ENCOUNTER — Telehealth: Payer: Self-pay | Admitting: Family Medicine

## 2016-03-28 ENCOUNTER — Telehealth: Payer: Self-pay

## 2016-03-28 MED ORDER — SUMATRIPTAN SUCCINATE 100 MG PO TABS: 100.0000 mg | ORAL_TABLET | ORAL | 0 refills | Status: DC | PRN

## 2016-03-28 NOTE — Telephone Encounter (Signed)
Please advise 

## 2016-03-28 NOTE — Telephone Encounter (Signed)
Pt aware.

## 2016-03-28 NOTE — Telephone Encounter (Signed)
Pt moved to [place], resent Rx to [place] called to cancel at [facility]

## 2016-03-28 NOTE — Addendum Note (Signed)
Addended by: Jamelle Haring on: 03/28/2016 09:41 AM   Modules accepted: Orders

## 2016-03-29 ENCOUNTER — Other Ambulatory Visit: Payer: Self-pay | Admitting: Family Medicine

## 2016-03-29 MED ORDER — RIZATRIPTAN BENZOATE 10 MG PO TABS: 10.0000 mg | ORAL_TABLET | ORAL | 0 refills | Status: DC | PRN

## 2016-03-29 NOTE — Telephone Encounter (Signed)
Pt notified [employer] won't cover more than 10 tablets a mth Verbalizes understanding

## 2016-03-29 NOTE — Telephone Encounter (Signed)
Note sent to Dr Livia Snellen what is preferred with Medicaid

## 2016-03-29 NOTE — Telephone Encounter (Signed)
Pt notified of RX 

## 2016-04-03 ENCOUNTER — Telehealth: Payer: Self-pay | Admitting: Family Medicine

## 2016-04-03 DIAGNOSIS — J351 Hypertrophy of tonsils: Secondary | ICD-10-CM

## 2016-04-03 NOTE — Telephone Encounter (Signed)
Please refer as requested 

## 2016-04-06 ENCOUNTER — Telehealth: Payer: Self-pay | Admitting: Family Medicine

## 2016-04-06 NOTE — Telephone Encounter (Signed)
Spoke with pt and gave her the appointment date/time and told her to expect a letter in the mail also.

## 2016-04-13 ENCOUNTER — Ambulatory Visit (INDEPENDENT_AMBULATORY_CARE_PROVIDER_SITE_OTHER): Payer: Medicaid Other | Admitting: Neurology

## 2016-04-13 ENCOUNTER — Encounter: Payer: Self-pay | Admitting: Neurology

## 2016-04-13 VITALS — BP 110/80 | HR 84 | Ht 70.0 in | Wt 220.0 lb

## 2016-04-13 DIAGNOSIS — G43711 Chronic migraine without aura, intractable, with status migrainosus: Secondary | ICD-10-CM

## 2016-04-13 DIAGNOSIS — R200 Anesthesia of skin: Secondary | ICD-10-CM | POA: Diagnosis not present

## 2016-04-13 DIAGNOSIS — R209 Unspecified disturbances of skin sensation: Secondary | ICD-10-CM

## 2016-04-13 DIAGNOSIS — R202 Paresthesia of skin: Secondary | ICD-10-CM

## 2016-04-13 MED ORDER — TOPIRAMATE 25 MG PO TABS: 25.0000 mg | ORAL_TABLET | Freq: Every day | ORAL | 5 refills | Status: DC

## 2016-04-13 NOTE — Progress Notes (Signed)
Follow-up Visit   Date: 04/13/16    Erica [name] MRN: [id number] DOB: Jan 21, 1981   Interim History: Erica [name] is a 36 y.o. right-handed Caucasian female with migraines and depression returning to the clinic with new complaints of headaches.  The patient was accompanied to the clinic by self.  History of present illness: Starting in March 2016, she was helping her fiance move a 400lb piece of furniture up the stairs and felt something in her back pop and felt very nauseous.  She developed immediate tingling sensation over her mid-back, right arm and neck and constant shooting and dull pain.  She went to urgent care and recommended using muscle relaxants.  Symptoms improved some with muscle relaxants, but pain has been persistent.  She had plain films of the thoracic spine which showed mild scoliosis.  MRI cervical spine was essentially normal.  She tried prednisone, hydrocodone, diclofenac, flexeril, and tramadol. She takes flexeril 20mg  at bedtime, tramadol 50mg , and diclofenac 75mg  currently.  In addition to pain and paresthesias, she complains of weakness of her hand and drops things frequently.  The numbness/tingling is mostly over the forearm and finger tips.  Prolonged activity with her right arm can exacerbate pain and makes it worse.  Pain is worse in her collar bone, described as "bones hurting".  NCS/EMG of the right arm was normal.  Her pain eventually improved with physical therapy.    UPDATE 04/14/2015:  She was referred for new complaints of headaches.  Headaches started in late November 2017 with piercing sharp pain at the vertex, retroorbital, and behind the ear.  Her pain is daily and constant.  She endorses photophobia and nausea, occasional vomiting.  She takes imitrex 100mg  due to insurance reasons, was switched to maxalt 10mg , which alleviates her pain by at least 50%. She takes ibuprofen daily and reports finishing 100 tablets in a week.  She has been given  depomedrol, toradol, and phenergen injection when she saw her PCP, but denies any improvement.  She denies prior illness preceding worsening migraines.  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   She reports having worsening headaches with sneezing.  No worsening with laughing or bearing down.  Headaches wake her up from sleeping, she reports being awoken at 3am.  She also complains of tingling over the left side of the face.     She had a prior history of menstrual migraines, occurring 1-2 times per month, but these are much worse in intensity and unrelenting.   Medications:  Current Outpatient Prescriptions on File Prior to Visit  Medication Sig Dispense Refill  . etonogestrel (NEXPLANON) 68 MG IMPL implant 1 each by Subdermal route once.    . nitroGLYCERIN (MINITRAN) 0.2 mg/hr patch Use one third patch daily for shoulder pain 30 patch 12  . Pediatric Multiple Vit-C-FA (PEDIATRIC MULTIVITAMIN) chewable tablet Chew 1 tablet by mouth daily.    . promethazine (PHENERGAN) 25 MG tablet Take 0.5-1 tablets (12.5-25 mg total) by mouth every 8 (eight) hours as needed for nausea or vomiting. 20 tablet 0  . rizatriptan (MAXALT) 10 MG tablet Take 1 tablet (10 mg total) by mouth as needed for migraine. May repeat in 2 hours if needed 10 tablet 0  . sertraline (ZOLOFT) 100 MG tablet Take 1 tablet (100 mg total) by mouth daily. 45 tablet 5  . SUMAtriptan (IMITREX) 100 MG tablet Take 1  tablet (100 mg total) by mouth every 2 (two) hours as needed for migraine. May repeat in 2 hours if headache persists or recurs. 10 tablet 0  . traMADol (ULTRAM) 50 MG tablet Take 1 tablet (50 mg total) by mouth 4 (four) times daily as needed for moderate pain. 60 tablet 02   No current facility-administered medications on file prior to visit.     Allergies:  Allergies  Allergen Reactions  . Amoxicillin   . Effexor [Venlafaxine]      Review of Systems:  CONSTITUTIONAL: No fevers, chills, night sweats, or weight loss.  EYES: No visual changes or eye pain ENT: No hearing changes.  No history of nose bleeds.   RESPIRATORY: No cough, wheezing and shortness of breath.   CARDIOVASCULAR: Negative for chest pain, and palpitations.   GI: Negative for abdominal discomfort, blood in stools or black stools.  No recent change in bowel habits.   GU:  No history of incontinence.   MUSCLOSKELETAL: No history of joint pain or swelling.  No myalgias.   SKIN: Negative for lesions, rash, and itching.   ENDOCRINE: Negative for cold or heat intolerance, polydipsia or goiter.   PSYCH:  +depression or anxiety symptoms.   NEURO: As Above.   Vital Signs:  BP 110/80   Pulse 84   Ht 5\' 10"  (1.778 m)   Wt 220 lb (99.8 kg)   SpO2 98%   BMI 31.57 kg/m   General:  Sitting with sunglasses on, mildly distressed due to pain HEENT:  Normal neck ROM, no carotid bruit CV:  RRR Pulm:  Clear to auscultation Ext:  No edema  Neurological Exam: MENTAL STATUS including orientation to time, place, person, recent and remote memory, attention span and concentration, language, and fund of knowledge is normal.  Speech is not dysarthric.  CRANIAL NERVES: No visual field defects. Normal fundoscopic exam. Pupils equal round and reactive to light.  Normal conjugate, extra-ocular eye movements in all directions of gaze.  No ptosis. She reports hyperesthesias to temperature over the left side of the face. Face is symmetric. Palate elevates symmetrically.  Tongue is midline.  MOTOR:  Motor strength is 5/5 in all extremities.  No pronator drift.  Tone is normal.    MSRs:  Reflexes are 2+/4 throughout.  SENSORY:  Hyperesthesia to temperature and light touch over the left arm, as compared to the right.  COORDINATION/GAIT:  Normal finger-to- nose-finger and heel-to-shin.  Intact rapid alternating movements bilaterally.  Gait narrow based and stable.   Stressed and tandem gait intact.  Data: MRI cervical spine wo contrast 10/06/2014:  Minor straightening of the normal cervical lordosis, nonspecific. No focal abnormality of the cervical spine.  CT head 02/19/2009:  Negative  NCS/EMG of the right arm:  Normal - no evidence of cervical radiculopathy or brachial plexopathy  IMPRESSION/PLAN: 1.  Intractable chronic migraine/new daily persistent headache.    - new   Start preventative agent with topiramate 25mg  daily.  Risks and side effects discussed  Start magnesium 400-600mg  daily  MRI brain wo contrast due to atypical features of headaches waking up from sleeping, facial paresthesia, and hemisensory changes  For severe migraine, start naproxen with benadryl 25mg   OR  maxalt 10mg   - limit to twice per week  2.  Medication overuse headaches - new  Limit all rescue medication (maxalt, ibuprofen, tylenol, Aleve) to twice per week  Call with with an update in 1 month  Return to clinic in 4 months  The duration of  this appointment visit was 30 minutes of face-to-face time with the patient.  Greater than 50% of this time was spent in counseling, explanation of diagnosis, planning of further management, and coordination of care.   Thank you for allowing me to participate in patient's care.  If I can answer any additional questions, I would be pleased to do so.    Sincerely,    Areeba Sulser K. Posey Pronto, DO

## 2016-04-13 NOTE — Patient Instructions (Addendum)
1.  Limit all rescue medication (maxalt, ibuprofen, tylenol, Aleve) to twice per week 2.  Start topirmate 25mg  daily 3.  Start magnesium oxide 400-600mg  daily  4.  For severe migraine, start naproxen with benadryl 25mg   OR  maxalt 10mg   - limit to twice per week 5.  MRI brain without contrast 6.  Call with with an update in 1 month  Return to clinic in 4 months

## 2016-04-17 ENCOUNTER — Ambulatory Visit (INDEPENDENT_AMBULATORY_CARE_PROVIDER_SITE_OTHER): Payer: Medicaid Other | Admitting: Otolaryngology

## 2016-04-17 DIAGNOSIS — R070 Pain in throat: Secondary | ICD-10-CM | POA: Diagnosis not present

## 2016-04-20 ENCOUNTER — Telehealth: Payer: Self-pay | Admitting: Neurology

## 2016-04-20 ENCOUNTER — Telehealth: Payer: Self-pay | Admitting: *Deleted

## 2016-04-20 ENCOUNTER — Ambulatory Visit
Admission: RE | Admit: 2016-04-20 | Discharge: 2016-04-20 | Disposition: A | Discharge status: Home / Self Care | Payer: No Typology Code available for payment source | Source: Ambulatory Visit | Attending: Neurology | Admitting: Neurology

## 2016-04-20 ENCOUNTER — Other Ambulatory Visit: Payer: Self-pay | Admitting: *Deleted

## 2016-04-20 ENCOUNTER — Encounter: Payer: Self-pay | Admitting: *Deleted

## 2016-04-20 ENCOUNTER — Inpatient Hospital Stay: Admission: RE | Admit: 2016-04-20 | Payer: Medicaid Other | Source: Ambulatory Visit

## 2016-04-20 DIAGNOSIS — R29898 Other symptoms and signs involving the musculoskeletal system: Secondary | ICD-10-CM

## 2016-04-20 DIAGNOSIS — G43711 Chronic migraine without aura, intractable, with status migrainosus: Secondary | ICD-10-CM

## 2016-04-20 DIAGNOSIS — M79601 Pain in right arm: Secondary | ICD-10-CM

## 2016-04-20 DIAGNOSIS — R209 Unspecified disturbances of skin sensation: Secondary | ICD-10-CM

## 2016-04-20 DIAGNOSIS — R200 Anesthesia of skin: Secondary | ICD-10-CM

## 2016-04-20 DIAGNOSIS — R202 Paresthesia of skin: Secondary | ICD-10-CM

## 2016-04-20 NOTE — Telephone Encounter (Signed)
-----   Message from Alda Berthold, DO sent at 04/20/2016  8:45 AM EST ----- Please inform patient MRI brain is normal.  Thanks.

## 2016-04-20 NOTE — Telephone Encounter (Signed)
Please call and inform patient that I personally reviewed her MRI brian and there is no tumor, stroke, MS, or anything else to explain headaches.    We can order MRA and MR Venogram head to be sure there is no abnormalities with her vessels, since she is still having headaches.    The benefits of topirmate are not immediate and will take a few weeks, so it is important to stay on topirmate 25mg  daily.   Otha Monical K. Posey Pronto, DO

## 2016-04-20 NOTE — Telephone Encounter (Signed)
Patient notified via My Chart

## 2016-04-20 NOTE — Telephone Encounter (Signed)
Called patient and gave her the results.  She would like to have the MRV and MRA done.  Orders placed.

## 2016-05-02 ENCOUNTER — Telehealth: Payer: Self-pay | Admitting: *Deleted

## 2016-05-02 NOTE — Telephone Encounter (Signed)
Left message notifying patient that her MRV and MRA have been denied.

## 2016-05-05 ENCOUNTER — Other Ambulatory Visit: Payer: Self-pay

## 2016-05-05 NOTE — Telephone Encounter (Signed)
completed

## 2016-05-11 ENCOUNTER — Other Ambulatory Visit: Payer: Self-pay

## 2016-05-15 ENCOUNTER — Ambulatory Visit (INDEPENDENT_AMBULATORY_CARE_PROVIDER_SITE_OTHER): Payer: Medicaid Other | Admitting: Otolaryngology

## 2016-05-15 DIAGNOSIS — R070 Pain in throat: Secondary | ICD-10-CM | POA: Diagnosis not present

## 2016-05-15 DIAGNOSIS — J3501 Chronic tonsillitis: Secondary | ICD-10-CM | POA: Diagnosis not present

## 2016-05-19 ENCOUNTER — Other Ambulatory Visit: Payer: Self-pay | Admitting: Otolaryngology

## 2016-05-30 ENCOUNTER — Other Ambulatory Visit: Payer: Self-pay | Admitting: Family Medicine

## 2016-06-07 ENCOUNTER — Encounter (HOSPITAL_BASED_OUTPATIENT_CLINIC_OR_DEPARTMENT_OTHER): Payer: Self-pay | Admitting: *Deleted

## 2016-06-07 NOTE — Progress Notes (Signed)
Bring all medications. Pt going to [facility] for EKG. Dr Conrad Ovando wanted EKG due to irregular heart beat, no cardiac notes from [place] - 6 years ago. Pt could not remember Dr's name.

## 2016-06-09 ENCOUNTER — Encounter (HOSPITAL_COMMUNITY)
Admission: RE | Admit: 2016-06-09 | Discharge: 2016-06-09 | Disposition: A | Discharge status: Home / Self Care | Payer: Medicaid Other | Source: Ambulatory Visit | Attending: Otolaryngology | Admitting: Otolaryngology

## 2016-06-09 ENCOUNTER — Other Ambulatory Visit: Payer: Self-pay

## 2016-06-12 NOTE — Progress Notes (Signed)
EKG reviewed by Dr. C.Jackson, will proceed with surgery as scheduled. 

## 2016-06-13 ENCOUNTER — Encounter (HOSPITAL_BASED_OUTPATIENT_CLINIC_OR_DEPARTMENT_OTHER): Payer: Self-pay | Admitting: *Deleted

## 2016-06-13 ENCOUNTER — Ambulatory Visit (HOSPITAL_BASED_OUTPATIENT_CLINIC_OR_DEPARTMENT_OTHER)
Admission: RE | Admit: 2016-06-13 | Discharge: 2016-06-13 | Disposition: A | Discharge status: Home / Self Care | Payer: Medicaid Other | Source: Ambulatory Visit | Attending: Otolaryngology | Admitting: Otolaryngology

## 2016-06-13 ENCOUNTER — Ambulatory Visit (HOSPITAL_BASED_OUTPATIENT_CLINIC_OR_DEPARTMENT_OTHER): Payer: Medicaid Other | Admitting: Anesthesiology

## 2016-06-13 ENCOUNTER — Encounter (HOSPITAL_BASED_OUTPATIENT_CLINIC_OR_DEPARTMENT_OTHER)
Admission: RE | Disposition: A | Discharge status: Home / Self Care | Payer: Self-pay | Source: Ambulatory Visit | Attending: Otolaryngology

## 2016-06-13 DIAGNOSIS — Z87891 Personal history of nicotine dependence: Secondary | ICD-10-CM | POA: Insufficient documentation

## 2016-06-13 DIAGNOSIS — J353 Hypertrophy of tonsils with hypertrophy of adenoids: Secondary | ICD-10-CM | POA: Insufficient documentation

## 2016-06-13 DIAGNOSIS — J3501 Chronic tonsillitis: Secondary | ICD-10-CM | POA: Diagnosis not present

## 2016-06-13 DIAGNOSIS — J3503 Chronic tonsillitis and adenoiditis: Secondary | ICD-10-CM | POA: Diagnosis not present

## 2016-06-13 HISTORY — PX: TONSILLECTOMY AND ADENOIDECTOMY: SHX28

## 2016-06-13 HISTORY — DX: Anxiety disorder, unspecified: F41.9

## 2016-06-13 HISTORY — DX: Cardiac arrhythmia, unspecified: I49.9

## 2016-06-13 HISTORY — DX: Obsessive-compulsive disorder, unspecified: F42.9

## 2016-06-13 SURGERY — TONSILLECTOMY AND ADENOIDECTOMY
Anesthesia: General | Site: Throat | Laterality: Bilateral

## 2016-06-13 MED ORDER — DEXAMETHASONE SODIUM PHOSPHATE 10 MG/ML IJ SOLN
INTRAMUSCULAR | Status: AC
  Filled 2016-06-13: qty 1

## 2016-06-13 MED ORDER — MIDAZOLAM HCL 2 MG/2ML IJ SOLN
1.0000 mg | INTRAMUSCULAR | Status: DC | PRN
  Administered 2016-06-13: 2 mg via INTRAVENOUS

## 2016-06-13 MED ORDER — SODIUM CHLORIDE 0.9 % IJ SOLN
INTRAMUSCULAR | Status: AC
  Filled 2016-06-13: qty 10

## 2016-06-13 MED ORDER — ATROPINE SULFATE 0.4 MG/ML IJ SOLN
INTRAMUSCULAR | Status: AC
  Filled 2016-06-13: qty 1

## 2016-06-13 MED ORDER — LIDOCAINE 2% (20 MG/ML) 5 ML SYRINGE
INTRAMUSCULAR | Status: DC | PRN
  Administered 2016-06-13: 50 mg via INTRAVENOUS

## 2016-06-13 MED ORDER — SODIUM CHLORIDE 0.9 % IR SOLN
Status: DC | PRN
  Administered 2016-06-13: 500 mL

## 2016-06-13 MED ORDER — OXYMETAZOLINE HCL 0.05 % NA SOLN
NASAL | Status: DC | PRN
  Administered 2016-06-13: 1 via TOPICAL

## 2016-06-13 MED ORDER — LACTATED RINGERS IV SOLN
INTRAVENOUS | Status: DC
  Administered 2016-06-13 (×3): via INTRAVENOUS

## 2016-06-13 MED ORDER — OXYCODONE HCL 5 MG PO TABS: 5.0000 mg | ORAL_TABLET | Freq: Once | ORAL | Status: DC | PRN

## 2016-06-13 MED ORDER — PROMETHAZINE HCL 25 MG/ML IJ SOLN
INTRAMUSCULAR | Status: AC
  Filled 2016-06-13: qty 1

## 2016-06-13 MED ORDER — SUCCINYLCHOLINE CHLORIDE 20 MG/ML IJ SOLN
INTRAMUSCULAR | Status: DC | PRN
  Administered 2016-06-13: 80 mg via INTRAVENOUS

## 2016-06-13 MED ORDER — LIDOCAINE 2% (20 MG/ML) 5 ML SYRINGE
INTRAMUSCULAR | Status: AC
  Filled 2016-06-13: qty 5

## 2016-06-13 MED ORDER — HYDROMORPHONE HCL 1 MG/ML IJ SOLN
INTRAMUSCULAR | Status: AC
  Filled 2016-06-13: qty 1

## 2016-06-13 MED ORDER — MIDAZOLAM HCL 2 MG/2ML IJ SOLN
INTRAMUSCULAR | Status: AC
  Filled 2016-06-13: qty 2

## 2016-06-13 MED ORDER — DEXAMETHASONE SODIUM PHOSPHATE 4 MG/ML IJ SOLN
INTRAMUSCULAR | Status: DC | PRN
  Administered 2016-06-13: 10 mg via INTRAVENOUS

## 2016-06-13 MED ORDER — ONDANSETRON HCL 4 MG/2ML IJ SOLN
INTRAMUSCULAR | Status: AC
  Filled 2016-06-13: qty 2

## 2016-06-13 MED ORDER — PROPOFOL 10 MG/ML IV BOLUS
INTRAVENOUS | Status: DC | PRN
  Administered 2016-06-13: 50 mg via INTRAVENOUS
  Administered 2016-06-13: 150 mg via INTRAVENOUS

## 2016-06-13 MED ORDER — MEPERIDINE HCL 25 MG/ML IJ SOLN: 6.2500 mg | INTRAMUSCULAR | Status: DC | PRN

## 2016-06-13 MED ORDER — AZITHROMYCIN 200 MG/5ML PO SUSR: 500.0000 mg | Freq: Every day | ORAL | 0 refills | Status: AC

## 2016-06-13 MED ORDER — ACETAMINOPHEN 10 MG/ML IV SOLN: 1000.0000 mg | Freq: Once | INTRAVENOUS | Status: DC | PRN

## 2016-06-13 MED ORDER — OXYCODONE HCL 5 MG/5ML PO SOLN: 5.0000 mg | ORAL | 0 refills | Status: DC | PRN

## 2016-06-13 MED ORDER — HYDROMORPHONE HCL 1 MG/ML IJ SOLN
0.2500 mg | INTRAMUSCULAR | Status: DC | PRN
  Administered 2016-06-13 (×4): 0.5 mg via INTRAVENOUS

## 2016-06-13 MED ORDER — SUCCINYLCHOLINE CHLORIDE 200 MG/10ML IV SOSY
PREFILLED_SYRINGE | INTRAVENOUS | Status: AC
  Filled 2016-06-13: qty 10

## 2016-06-13 MED ORDER — PROPOFOL 500 MG/50ML IV EMUL
INTRAVENOUS | Status: AC
  Filled 2016-06-13: qty 50

## 2016-06-13 MED ORDER — ONDANSETRON HCL 4 MG/2ML IJ SOLN
INTRAMUSCULAR | Status: DC | PRN
  Administered 2016-06-13: 4 mg via INTRAVENOUS

## 2016-06-13 MED ORDER — PROMETHAZINE HCL 25 MG/ML IJ SOLN
6.2500 mg | INTRAMUSCULAR | Status: DC | PRN
  Administered 2016-06-13: 6.25 mg via INTRAVENOUS

## 2016-06-13 MED ORDER — FENTANYL CITRATE (PF) 100 MCG/2ML IJ SOLN
50.0000 ug | INTRAMUSCULAR | Status: DC | PRN
  Administered 2016-06-13: 50 ug via INTRAVENOUS
  Administered 2016-06-13: 100 ug via INTRAVENOUS

## 2016-06-13 MED ORDER — FENTANYL CITRATE (PF) 100 MCG/2ML IJ SOLN
INTRAMUSCULAR | Status: AC
  Filled 2016-06-13: qty 2

## 2016-06-13 MED ORDER — SCOPOLAMINE 1 MG/3DAYS TD PT72
MEDICATED_PATCH | TRANSDERMAL | Status: AC
  Filled 2016-06-13: qty 1

## 2016-06-13 MED ORDER — SCOPOLAMINE 1 MG/3DAYS TD PT72
1.0000 | MEDICATED_PATCH | Freq: Once | TRANSDERMAL | Status: DC | PRN
  Administered 2016-06-13: 1.5 mg via TRANSDERMAL

## 2016-06-13 MED ORDER — OXYCODONE HCL 5 MG/5ML PO SOLN: 5.0000 mg | Freq: Once | ORAL | Status: DC | PRN

## 2016-06-13 SURGICAL SUPPLY — 33 items
BANDAGE COBAN STERILE 2 (GAUZE/BANDAGES/DRESSINGS) IMPLANT
CANISTER SUCT 1200ML W/VALVE (MISCELLANEOUS) ×3 IMPLANT
CATH ROBINSON RED A/P 10FR (CATHETERS) IMPLANT
CATH ROBINSON RED A/P 14FR (CATHETERS) ×2 IMPLANT
COAGULATOR SUCT 6 FR SWTCH (ELECTROSURGICAL)
COAGULATOR SUCT SWTCH 10FR 6 (ELECTROSURGICAL) IMPLANT
COVER MAYO STAND STRL (DRAPES) ×3 IMPLANT
ELECT REM PT RETURN 9FT ADLT (ELECTROSURGICAL) ×3
ELECT REM PT RETURN 9FT PED (ELECTROSURGICAL)
ELECTRODE REM PT RETRN 9FT PED (ELECTROSURGICAL) IMPLANT
ELECTRODE REM PT RTRN 9FT ADLT (ELECTROSURGICAL) IMPLANT
GAUZE SPONGE 4X4 12PLY STRL LF (GAUZE/BANDAGES/DRESSINGS) ×3 IMPLANT
GLOVE BIO SURGEON STRL SZ7.5 (GLOVE) ×3 IMPLANT
GLOVE BIOGEL PI IND STRL 6.5 (GLOVE) IMPLANT
GLOVE BIOGEL PI INDICATOR 6.5 (GLOVE) ×2
GLOVE SURG SS PI 6.5 STRL IVOR (GLOVE) ×2 IMPLANT
GOWN STRL REUS W/ TWL LRG LVL3 (GOWN DISPOSABLE) ×2 IMPLANT
GOWN STRL REUS W/TWL LRG LVL3 (GOWN DISPOSABLE) ×9
IV NS 500ML (IV SOLUTION) ×3
IV NS 500ML BAXH (IV SOLUTION) ×1 IMPLANT
MARKER SKIN DUAL TIP RULER LAB (MISCELLANEOUS) IMPLANT
NS IRRIG 1000ML POUR BTL (IV SOLUTION) ×3 IMPLANT
SHEET MEDIUM DRAPE 40X70 STRL (DRAPES) ×3 IMPLANT
SOLUTION BUTLER CLEAR DIP (MISCELLANEOUS) ×3 IMPLANT
SPONGE TONSIL 1 RF SGL (DISPOSABLE) IMPLANT
SPONGE TONSIL 1.25 RF SGL STRG (GAUZE/BANDAGES/DRESSINGS) ×2 IMPLANT
SYR BULB 3OZ (MISCELLANEOUS) ×2 IMPLANT
TOWEL OR 17X24 6PK STRL BLUE (TOWEL DISPOSABLE) ×3 IMPLANT
TUBE CONNECTING 20'X1/4 (TUBING) ×1
TUBE CONNECTING 20X1/4 (TUBING) ×2 IMPLANT
TUBE SALEM SUMP 12R W/ARV (TUBING) IMPLANT
TUBE SALEM SUMP 16 FR W/ARV (TUBING) ×2 IMPLANT
WAND COBLATOR 70 EVAC XTRA (SURGICAL WAND) ×3 IMPLANT

## 2016-06-13 NOTE — Discharge Instructions (Addendum)
[facility] Postoperative Instructions for Tonsillectomy & Adenoidectomy (T&A) Activity Restrict activity at home for the first two days, resting as much as possible. Light indoor activity is best. You may usually return to school or work within a week but void strenuous activity and sports for two weeks. Sleep with your head elevated on 2-3 pillows for 3-4 days to help decrease swelling. Diet Due to tissue swelling and throat discomfort, you may have little desire to drink for several days. However fluids are very important to prevent dehydration. You will find that non-acidic juices, soups, popsicles, Jell-O, custard, puddings, and any soft or mashed foods taken in small quantities can be swallowed fairly easily. Try to increase your fluid and food intake as the discomfort subsides. It is recommended that a child receive 1-1/2 quarts of fluid in a 24-hour period. Adult require twice this amount.  Discomfort Your sore throat may be relieved by applying an ice collar to your neck and/or by taking Tylenol. You may experience an earache, which is due to referred pain from the throat. Referred ear pain is commonly felt at night when trying to rest.  Bleeding                        Although rare, there is risk of having some bleeding during the first 2 weeks after having a T&A. This usually happens between days 7-10 postoperatively. If you or your child should have any bleeding, try to remain calm. We recommend sitting up quietly in a chair and gently spitting out the blood into a bowl. For adults, gargling gently with ice water may help. If the bleeding does not stop after a short time (5 minutes), is more than 1 teaspoonful, or if you become worried, please call our office at [phone] or go directly to the nearest hospital emergency room. Do not eat or drink anything prior to going to the hospital as you may need to be taken to the operating room in order to control the bleeding. GENERAL  CONSIDERATIONS 1. Brush your teeth regularly. Avoid mouthwashes and gargles for three weeks. You may gargle gently with warm salt-water as necessary or spray with Chloraseptic. You may make salt-water by placing 2 teaspoons of table salt into a quart of fresh water. Warm the salt-water in a microwave to a luke warm temperature.  2. Avoid exposure to colds and upper respiratory infections if possible.  3. If you look into a mirror or into your child's mouth, you will see white-gray patches in the back of the throat. This is normal after having a T&A and is like a scab that forms on the skin after an abrasion. It will disappear once the back of the throat heals completely. However, it may cause a noticeable odor; this too will disappear with time. Again, warm salt-water gargles may be used to help keep the throat clean and promote healing.  4. You may notice a temporary change in voice quality, such as a higher pitched voice or a nasal sound, until healing is complete. This may last for 1-2 weeks and should resolve.  5. Do not take or give you child any medications that we have not prescribed or recommended.  6. Snoring may occur, especially at night, for the first week after a T&A. It is due to swelling of the soft palate and will usually resolve.  Please call our office at [phone] if you have any questions.  Post Anesthesia Home Care Instructions  Activity: Get plenty of rest for the remainder of the day. A responsible individual must stay with you for 24 hours following the procedure.  For the next 24 hours, DO NOT: -Drive a car -Operate machinery -Drink alcoholic beverages -Take any medication unless instructed by your physician -Make any legal decisions or sign important papers.  Meals: Start with liquid foods such as gelatin or soup. Progress to regular foods as tolerated. Avoid greasy, spicy, heavy foods. If nausea and/or vomiting occur, drink only clear liquids until the nausea  and/or vomiting subsides. Call your physician if vomiting continues.  Special Instructions/Symptoms: Your throat may feel dry or sore from the anesthesia or the breathing tube placed in your throat during surgery. If this causes discomfort, gargle with warm salt water. The discomfort should disappear within 24 hours.  If you had a scopolamine patch placed behind your ear for the management of post- operative nausea and/or vomiting:  1. The medication in the patch is effective for 72 hours, after which it should be removed.  Wrap patch in a tissue and discard in the trash. Wash hands thoroughly with soap and water. 2. You may remove the patch earlier than 72 hours if you experience unpleasant side effects which may include dry mouth, dizziness or visual disturbances. 3. Avoid touching the patch. Wash your hands with soap and water after contact with the patch.     

## 2016-06-13 NOTE — Anesthesia Preprocedure Evaluation (Signed)
Anesthesia Evaluation  Patient identified by MRN, date of birth, ID band Patient awake    Reviewed: Allergy & Precautions, NPO status , Patient's Chart, lab work & pertinent test results  Airway Mallampati: II  TM Distance: >3 FB Neck ROM: Full    Dental no notable dental hx.    Pulmonary neg pulmonary ROS, former smoker,    Pulmonary exam normal breath sounds clear to auscultation       Cardiovascular Normal cardiovascular exam+ dysrhythmias  Rhythm:Regular Rate:Normal     Neuro/Psych negative neurological ROS  negative psych ROS   GI/Hepatic negative GI ROS, Neg liver ROS,   Endo/Other  negative endocrine ROS  Renal/GU negative Renal ROS     Musculoskeletal negative musculoskeletal ROS (+)   Abdominal   Peds  Hematology negative hematology ROS (+)   Anesthesia Other Findings   Reproductive/Obstetrics negative OB ROS                             Anesthesia Physical Anesthesia Plan  ASA: II  Anesthesia Plan: General   Post-op Pain Management:    Induction: Intravenous  Airway Management Planned: Oral ETT  Additional Equipment:   Intra-op Plan:   Post-operative Plan: Extubation in OR  Informed Consent: I have reviewed the patients History and Physical, chart, labs and discussed the procedure including the risks, benefits and alternatives for the proposed anesthesia with the patient or authorized representative who has indicated his/her understanding and acceptance.   Dental advisory given  Plan Discussed with: CRNA  Anesthesia Plan Comments:         Anesthesia Quick Evaluation

## 2016-06-13 NOTE — H&P (Signed)
Cc: Chronic sore throat  HPI: The patient is a 36 y/o female who returns today for follow up evaluation of sore throat and headaches. She was last seen on 04/17/2016. At that time, the patient was noted to have a normal ENT evaluation. Tonsils were 2+ without evidence of acute infection.  No suspicious mass or lesion was noted on fiberoptic laryngoscopy exam. The patient's throat pain and headaches were felt to be neurogenic in etiology. No new medication was dispensed. The patient returns today complaining of persistent sore throat. She feels her throat pain has gotten worse since her last visit. The patient notes hoarseness and difficulty swallowing at times. She also notes tonsillar swelling and left ear pain.  The patient did see a neurologist and was diagnosed with cluster headaches. No other ENT, GI, or respiratory issue noted since the last visit.   Exam General: Communicates without difficulty, well nourished, no acute distress. Head: Normocephalic, no evidence injury, no tenderness, facial buttresses intact without stepoff. Eyes: PERRL, EOMI. No scleral icterus, conjunctivae clear. Ears: External auditory canals clear bilaterally. There is no edema or erythema. Tympanic membrane is within normal limits bilaterally. Nose: Normal skin and external support. Anterior rhinoscopy reveals healthy pink mucosa over the septum and turbinates. No lesions or polyps were seen. Oral cavity: Lips without lesions, oral mucosa moist, no masses or lesions seen. Tonsils 3+ with mild erythema. Slight asymmetry is noted to on the left. Pharynx: Clear, no erythema. Neck: Supple, full range of motion, no lymphadenopathy, no masses palpable. Salivary: Parotid and submandibular glands without mass. Neuro:  CN 2-12 grossly intact. Gait normal. Vestibular: No nystagmus at any point of gaze.   Assessment 1. Persistent sore throat with tonsillar hypertrophy and mild erythema. Slight asymmetry in size is noted on the left.  2.  No suspicious mass or lesion is noted on previous fiberoptic laryngoscopy exam.  Plan  1. The physical exam findings are reviewed with the patient.  2.  In light of the patient's persistent sore throat and tonsillar hypertrophy/erythema, she may benefit from tonsillectomy and possible adenoidectomy. The risks, benefits, alternatives, and details of the procedure are reviewed with the patient. Questions are invited and answered. 3.  The patient is interested in proceeding with the procedure.  We will schedule the procedure in accordance with the family schedule.

## 2016-06-13 NOTE — Anesthesia Postprocedure Evaluation (Signed)
Anesthesia Post Note  Patient: Erica [name]  Procedure(s) Performed: Procedure(s) (LRB): TONSILLECTOMY AND ADENOIDECTOMY (Bilateral)  Patient location during evaluation: PACU Anesthesia Type: General Level of consciousness: sedated and patient cooperative Pain management: pain level controlled Vital Signs Assessment: post-procedure vital signs reviewed and stable Respiratory status: spontaneous breathing Cardiovascular status: stable Anesthetic complications: no       Last Vitals:  Vitals:   06/13/16 0940 06/13/16 0945  BP:  124/81  Pulse: 71 69  Resp: 14 11  Temp:      Last Pain:  Vitals:   06/13/16 0945  TempSrc:   PainSc: (P) 6                  Nolon Nations

## 2016-06-13 NOTE — Transfer of Care (Signed)
Immediate Anesthesia Transfer of Care Note  Patient: Erica [name]  Procedure(s) Performed: Procedure(s): TONSILLECTOMY WITH POSSIBLE  ADENOIDECTOMY (N/A)  Patient Location: PACU  Anesthesia Type:General  Level of Consciousness: sedated  Airway & Oxygen Therapy: Patient Spontanous Breathing and Patient connected to face mask oxygen  Post-op Assessment: Report given to RN and Post -op Vital signs reviewed and stable  Post vital signs: Reviewed and stable  Last Vitals:  Vitals:   06/13/16 0754 06/13/16 0917  BP: 118/83 138/87  Pulse: 72 86  Resp: 18   Temp: 37.1 C     Last Pain:  Vitals:   06/13/16 0754  TempSrc: Oral  PainSc: 7          Complications: No apparent anesthesia complications

## 2016-06-13 NOTE — Anesthesia Procedure Notes (Signed)
Procedure Name: Intubation Date/Time: 06/13/2016 8:37 AM Performed by: Lieutenant Diego Pre-anesthesia Checklist: Patient identified, Emergency Drugs available, Suction available and Patient being monitored Patient Re-evaluated:Patient Re-evaluated prior to inductionOxygen Delivery Method: Circle system utilized Preoxygenation: Pre-oxygenation with 100% oxygen Intubation Type: IV induction Ventilation: Mask ventilation without difficulty Laryngoscope Size: Mac and 3 Grade View: Grade I Tube type: Oral Number of attempts: 1 Airway Equipment and Method: Stylet and Oral airway Placement Confirmation: ETT inserted through vocal cords under direct vision,  positive ETCO2 and breath sounds checked- equal and bilateral Secured at: 22 cm Tube secured with: Tape Dental Injury: Teeth and Oropharynx as per pre-operative assessment

## 2016-06-13 NOTE — Op Note (Signed)
DATE OF PROCEDURE:  06/13/2016                              OPERATIVE REPORT  SURGEON:  Leta Baptist, MD  PREOPERATIVE DIAGNOSES: 1. Adenotonsillar hypertrophy. 2. Chronic tonsillitis and pharyngitis.  POSTOPERATIVE DIAGNOSES: 1. Adenotonsillar hypertrophy. 2. Chronic tonsillitis and pharyngitis.  PROCEDURE PERFORMED:  Adenotonsillectomy.  ANESTHESIA:  General endotracheal tube anesthesia.  COMPLICATIONS:  None.  ESTIMATED BLOOD LOSS:  Minimal.  INDICATION FOR PROCEDURE:  Erica [name] is a 36 y.o. female with a history of chronic tonsillitis/pharyngitis and halitosis.  According to the patient, she has been experiencing chronic throat discomfort for several years. The patient continues to be symptomatic despite medical treatments. On examination, the patient was noted to have bilateral cryptic tonsils, with numerous tonsilloliths. Based on the above findings, the decision was made for the patient to undergo the adenotonsillectomy procedure. Likelihood of success in reducing symptoms was also discussed.  The risks, benefits, alternatives, and details of the procedure were discussed with the patient.  Questions were invited and answered.  Informed consent was obtained.  DESCRIPTION:  The patient was taken to the operating room and placed supine on the operating table.  General endotracheal tube anesthesia was administered by the anesthesiologist.  The patient was positioned and prepped and draped in a standard fashion for adenotonsillectomy.  A Crowe-Davis mouth gag was inserted into the oral cavity for exposure. 3+ cryptic tonsils were noted bilaterally.  No bifidity was noted.  Indirect mirror examination of the nasopharynx revealed mild adenoid hypertrophy. The adenoid was ablated with the Coblator device. Hemostasis was achieved with the Coblator device.  The right tonsil was then grasped with a straight Allis clamp and retracted medially.  It was resected free from the underlying pharyngeal  constrictor muscles with the Coblator device.  The same procedure was repeated on the left side without exception.  The surgical sites were copiously irrigated.  The mouth gag was removed.  The care of the patient was turned over to the anesthesiologist.  The patient was awakened from anesthesia without difficulty.  The patient was extubated and transferred to the recovery room in good condition.  OPERATIVE FINDINGS:  Adenotonsillar hypertrophy.  SPECIMEN:  Bilateral tonsils  FOLLOWUP CARE:  The patient will be discharged home once awake and alert.  She will be placed on clindamycin for 3 days, and oxycodone 5-23ml po q 4 hours for postop pain control.   The patient will follow up in my office in approximately 2 weeks.  Ravi Tuccillo W [name] 06/13/2016 9:12 AM

## 2016-06-15 ENCOUNTER — Encounter (HOSPITAL_BASED_OUTPATIENT_CLINIC_OR_DEPARTMENT_OTHER): Payer: Self-pay | Admitting: Otolaryngology

## 2016-06-29 ENCOUNTER — Ambulatory Visit (INDEPENDENT_AMBULATORY_CARE_PROVIDER_SITE_OTHER): Payer: Medicaid Other | Admitting: Otolaryngology

## 2016-08-14 ENCOUNTER — Encounter: Payer: Self-pay | Admitting: Neurology

## 2016-08-14 ENCOUNTER — Ambulatory Visit (INDEPENDENT_AMBULATORY_CARE_PROVIDER_SITE_OTHER): Payer: Medicaid Other | Admitting: Neurology

## 2016-08-14 VITALS — BP 110/70 | HR 71 | Ht 70.0 in | Wt 221.2 lb

## 2016-08-14 DIAGNOSIS — R51 Headache: Secondary | ICD-10-CM

## 2016-08-14 DIAGNOSIS — R519 Headache, unspecified: Secondary | ICD-10-CM

## 2016-08-14 MED ORDER — RIZATRIPTAN BENZOATE 10 MG PO TABS
ORAL_TABLET | ORAL | 11 refills | Status: DC
Start: 2016-08-14 — End: 2017-08-29

## 2016-08-14 MED ORDER — TOPIRAMATE 50 MG PO TABS: 50.0000 mg | ORAL_TABLET | Freq: Every day | ORAL | 11 refills | Status: DC

## 2016-08-14 NOTE — Progress Notes (Signed)
Follow-up Visit   Date: 08/14/16    Erica [name] MRN: [id number] DOB: [birth year]   Interim History: Erica [name] is a 36 y.o. right-handed Caucasian female with migraines and depression returning to the clinic with headaches.  The patient was accompanied to the clinic by self.  History of present illness: Starting in March 2016, she was helping her fiance move a 400lb piece of furniture up the stairs and felt something in her back pop and felt very nauseous.  She developed immediate tingling sensation over her mid-back, right arm and neck and constant shooting and dull pain.  She went to urgent care and recommended using muscle relaxants.  Symptoms improved some with muscle relaxants, but pain has been persistent.  She had plain films of the thoracic spine which showed mild scoliosis.  MRI cervical spine was essentially normal.  She tried prednisone, hydrocodone, diclofenac, flexeril, and tramadol. She takes flexeril 20mg  at bedtime, tramadol 50mg , and diclofenac 75mg  currently.  In addition to pain and paresthesias, she complains of weakness of her hand and drops things frequently.  The numbness/tingling is mostly over the forearm and finger tips.  Prolonged activity with her right arm can exacerbate pain and makes it worse.  Pain is worse in her collar bone, described as "bones hurting".  NCS/EMG of the right arm was normal.  Her pain eventually improved with physical therapy.    UPDATE 04/14/2015:  She was referred for new complaints of headaches.  Headaches started in late November 2017 with piercing sharp pain at the vertex, retroorbital, and behind the ear.  She endorses photophobia and nausea, occasional vomiting.  She takes imitrex 100mg  due to insurance reasons, was switched to maxalt 10mg , which alleviates her pain by at least 50%. She takes ibuprofen daily and reports finishing 100 tablets in a week.  She has been given depomedrol, toradol, and phenergen injection when she saw  her PCP, but denies any improvement.  She denies prior illness preceding worsening migraines.  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   She reports having worsening headaches with sneezing.  Headaches wake her up from sleeping, she reports being awoken at 3am.  She also complains of tingling over the left side of the face.   She had a prior history of menstrual migraines, occurring 1-2 times per month, but these are much worse in intensity and unrelenting.   UPDATE 08/14/2016:  She presents for 45-month follow-up visit.  At her last visit, her headaches were atypical and therefore intracranial imaging for ordered and did not show anything worrisome. MRA head and MRV was denied but her headaches are doing much better.  She was started on topiramate and reports benefit because she does not have daily headaches and now only gets headaches the week prior to her menstrual cycle.  Otherwise, she has migraines about once per week which she takes Maxalt 10mg  which relieves her headaches within an hour.  She also complains of black spots in her vision which can be present even with her eyes closed and is scheduled to see an eye doctor in July.  She denies any blurry vision, vision loss, or eye pain.     Medications:  Current Outpatient Prescriptions on File Prior to Visit  Medication Sig Dispense Refill  . etonogestrel (NEXPLANON) 68 MG IMPL implant 1 each by Subdermal route once.    . magnesium gluconate (  MAGONATE) 500 MG tablet Take 500 mg by mouth 2 (two) times daily.    . nitroGLYCERIN (MINITRAN) 0.2 mg/hr patch Use one third patch daily for shoulder pain 30 patch 12  . oxyCODONE (ROXICODONE) 5 MG/5ML solution Take 5-10 mLs (5-10 mg total) by mouth every 4 (four) hours as needed for severe pain. 300 mL 0  . promethazine (PHENERGAN) 25 MG tablet Take 0.5-1 tablets (12.5-25 mg total) by mouth every 8 (eight) hours as  needed for nausea or vomiting. 20 tablet 0  . rizatriptan (MAXALT) 10 MG tablet TAKE ONE TABLET BY MOUTH AS NEEDED FOR MIGRAINE. MAY REPEAT IN 2 HOURS IF NEEDED. 10 tablet 2  . sertraline (ZOLOFT) 100 MG tablet Take 1 tablet (100 mg total) by mouth daily. 45 tablet 5  . SUMAtriptan (IMITREX) 100 MG tablet Take 1 tablet (100 mg total) by mouth every 2 (two) hours as needed for migraine. May repeat in 2 hours if headache persists or recurs. 10 tablet 0  . topiramate (TOPAMAX) 25 MG tablet Take 1 tablet (25 mg total) by mouth daily. 30 tablet 5  . traMADol (ULTRAM) 50 MG tablet Take 1 tablet (50 mg total) by mouth 4 (four) times daily as needed for moderate pain. 60 tablet 02   No current facility-administered medications on file prior to visit.     Allergies:  Allergies  Allergen Reactions  . Amoxicillin Anaphylaxis  . Effexor [Venlafaxine] Hives  . Other Hives    Poppy Seeds    Review of Systems:  CONSTITUTIONAL: No fevers, chills, night sweats, or weight loss.  EYES: No visual changes or eye pain ENT: No hearing changes.  No history of nose bleeds.   RESPIRATORY: No cough, wheezing and shortness of breath.   CARDIOVASCULAR: Negative for chest pain, and palpitations.   GI: Negative for abdominal discomfort, blood in stools or black stools.  No recent change in bowel habits.   GU:  No history of incontinence.   MUSCLOSKELETAL: No history of joint pain or swelling.  No myalgias.   SKIN: Negative for lesions, rash, and itching.   ENDOCRINE: Negative for cold or heat intolerance, polydipsia or goiter.   PSYCH:  +depression or anxiety symptoms.   NEURO: As Above.   Vital Signs:  BP 110/70   Pulse 71   Ht 5\' 10"  (1.778 m)   Wt 221 lb 4 oz (100.4 kg)   SpO2 98%   BMI 31.75 kg/m   Neurological Exam: MENTAL STATUS including orientation to time, place, person, recent and remote memory, attention span and concentration, language, and fund of knowledge is normal.  Speech is not  dysarthric.  CRANIAL NERVES:  No visual field defects. Pupils equal round and reactive to light.  Normal conjugate, extra-ocular eye movements in all directions of gaze.  No ptosis. Face is symmetric. Palate elevates symmetrically.  Tongue is midline.  MOTOR:  Mild give-way weakness, but with repeated testing motor strength is 5/5 in all extremities.    MSRs:  Reflexes are 2+/4 throughout.  SENSORY:  Temperature is reduced over the left arm as compared to the right in a patchy distribution.  COORDINATION/GAIT:  Normal finger-to- nose-finger. Gait narrow based and stable.  Data: MRI cervical spine wo contrast 10/06/2014:  Minor straightening of the normal cervical lordosis, nonspecific. No focal abnormality of the cervical spine.  CT head 02/19/2009:  Negative  NCS/EMG of the right arm:  Normal - no evidence of cervical radiculopathy or brachial plexopathy  MRI brain wo contrast 04/20/2016:  Normal   IMPRESSION/PLAN: 1.  New daily persistent headache with associated facial paresthesia, and hemisensory changes - improved  Exam is nonfocal and MRI brain is normal.    Increase topiramate 25mg  daily.  Risks and side effects discussed.  Encouraged her to use two forms of contraception  For severe migraine, use maxalt 10mg  as needed, limit to twice per week  2.  Medication overuse headaches - improved   Limit all rescue medication (maxalt, ibuprofen, tylenol, Aleve) to twice per week  3.  Intermittent vision changes, follow up with eye care specialist.  Return to clinic in 6 months  The duration of this appointment visit was 25 minutes of face-to-face time with the patient.  Greater than 50% of this time was spent in counseling, explanation of diagnosis, planning of further management, and coordination of care.   Thank you for allowing me to participate in patient's care.  If I can answer any additional questions, I would be pleased to do so.    Sincerely,    Donika K. Posey Pronto,  DO

## 2016-08-14 NOTE — Patient Instructions (Signed)
1.  Increase topiramate 50mg  at bedtime 2.  Limit all rescue medications such as Maxalt, ibuprofen, tylenol, naproxen to twice per week  Return to clinic in 6 months

## 2016-08-27 ENCOUNTER — Other Ambulatory Visit: Payer: Self-pay | Admitting: Nurse Practitioner

## 2016-08-27 DIAGNOSIS — F411 Generalized anxiety disorder: Secondary | ICD-10-CM

## 2016-08-27 DIAGNOSIS — F429 Obsessive-compulsive disorder, unspecified: Secondary | ICD-10-CM

## 2016-08-29 NOTE — Telephone Encounter (Signed)
Last seen 03/23/16 Dr Wendi Snipes

## 2016-09-01 ENCOUNTER — Other Ambulatory Visit: Payer: Self-pay | Admitting: *Deleted

## 2016-09-01 ENCOUNTER — Telehealth: Payer: Self-pay | Admitting: Neurology

## 2016-09-01 DIAGNOSIS — H4710 Unspecified papilledema: Secondary | ICD-10-CM

## 2016-09-01 NOTE — Telephone Encounter (Signed)
Patient notified that we will order the LP.  Order placed with [facility].

## 2016-09-01 NOTE — Telephone Encounter (Signed)
Note rec'd from [facility] ([place], [place]) Mild optic nerve edema in both eyes, no enlarged blind spots.  No venous pulsations in both eyes.    Please inform patient that we will order spinal tap to evaluate opening and closing pressure.  Shirlean Berman K. Posey Pronto, DO

## 2016-09-12 ENCOUNTER — Ambulatory Visit
Admission: RE | Admit: 2016-09-12 | Discharge: 2016-09-12 | Disposition: A | Discharge status: Home / Self Care | Payer: Medicaid Other | Source: Ambulatory Visit | Attending: Neurology | Admitting: Neurology

## 2016-09-12 DIAGNOSIS — H4710 Unspecified papilledema: Secondary | ICD-10-CM

## 2016-09-12 LAB — CSF CELL COUNT WITH DIFFERENTIAL
RBC COUNT CSF: 5 {cells}/uL (ref 0–10)
WBC CSF: 1 {cells}/uL (ref 0–5)

## 2016-09-12 LAB — PROTEIN, CSF: Total Protein, CSF: 23 mg/dL (ref 15–45)

## 2016-09-12 LAB — GLUCOSE, CSF: Glucose, CSF: 61 mg/dL (ref 43–76)

## 2016-09-12 NOTE — Discharge Instructions (Signed)

## 2016-09-13 ENCOUNTER — Telehealth: Payer: Self-pay | Admitting: Neurology

## 2016-09-13 ENCOUNTER — Telehealth: Payer: Self-pay | Admitting: *Deleted

## 2016-09-13 MED ORDER — TOPIRAMATE 50 MG PO TABS
ORAL_TABLET | ORAL | 11 refills | Status: DC
Start: 2016-09-13 — End: 2017-08-29

## 2016-09-13 NOTE — Telephone Encounter (Signed)
Discussed results of LP, normal CSF. The opening pressure was equivocal, 21, however since eye exam had shown changes, would opt to increase Topamax a little to 75mg  qhs. She is currently taking 50mg  qhs, and will start taking 1.5 tabs qhs. She has a little more of a headache and back pain since the LP, caffeine did help, instructed to keep lying flat and increasing fluid hydration, can continue caffeine intake. Rx for higher dose Topamax sent to pharmacy.

## 2016-09-13 NOTE — Telephone Encounter (Signed)
-----   Message from Pieter Partridge, DO sent at 09/13/2016  9:22 AM EDT ----- Spinal fluid looks okay

## 2016-09-13 NOTE — Telephone Encounter (Signed)
Dr. Delice Lesch will call and inform patient of results.

## 2016-10-12 ENCOUNTER — Encounter: Payer: Self-pay | Admitting: Nurse Practitioner

## 2016-10-12 ENCOUNTER — Ambulatory Visit (INDEPENDENT_AMBULATORY_CARE_PROVIDER_SITE_OTHER): Payer: Medicaid Other | Admitting: Nurse Practitioner

## 2016-10-12 VITALS — BP 114/68 | HR 83 | Temp 98.0°F | Ht 70.0 in | Wt 219.0 lb

## 2016-10-12 DIAGNOSIS — F422 Mixed obsessional thoughts and acts: Secondary | ICD-10-CM | POA: Diagnosis not present

## 2016-10-12 DIAGNOSIS — M5010 Cervical disc disorder with radiculopathy, unspecified cervical region: Secondary | ICD-10-CM | POA: Diagnosis not present

## 2016-10-12 DIAGNOSIS — G43009 Migraine without aura, not intractable, without status migrainosus: Secondary | ICD-10-CM | POA: Diagnosis not present

## 2016-10-12 DIAGNOSIS — F411 Generalized anxiety disorder: Secondary | ICD-10-CM | POA: Diagnosis not present

## 2016-10-12 DIAGNOSIS — F429 Obsessive-compulsive disorder, unspecified: Secondary | ICD-10-CM

## 2016-10-12 MED ORDER — SERTRALINE HCL 100 MG PO TABS: 200.0000 mg | ORAL_TABLET | Freq: Every day | ORAL | 5 refills | Status: DC

## 2016-10-12 NOTE — Progress Notes (Signed)
Subjective:    Patient ID: Erica [name], female    DOB: June 04, 1980, 36 y.o.   MRN: [id number]  HPI  Erica [name] is here today for follow up of chronic medical problem.  Outpatient Encounter Prescriptions as of 10/12/2016  Medication Sig  . etonogestrel (NEXPLANON) 68 MG IMPL implant 1 each by Subdermal route once.  . magnesium gluconate (MAGONATE) 500 MG tablet Take 500 mg by mouth 2 (two) times daily.  . nitroGLYCERIN (MINITRAN) 0.2 mg/hr patch Use one third patch daily for shoulder pain  . oxyCODONE (ROXICODONE) 5 MG/5ML solution Take 5-10 mLs (5-10 mg total) by mouth every 4 (four) hours as needed for severe pain.  . promethazine (PHENERGAN) 25 MG tablet Take 0.5-1 tablets (12.5-25 mg total) by mouth every 8 (eight) hours as needed for nausea or vomiting.  . rizatriptan (MAXALT) 10 MG tablet TAKE ONE TABLET BY MOUTH AS NEEDED FOR MIGRAINE. MAY REPEAT IN 2 HOURS IF NEEDED.  Marland Kitchen sertraline (ZOLOFT) 100 MG tablet TAKE ONE TABLET BY MOUTH ONCE DAILY  . topiramate (TOPAMAX) 50 MG tablet Take 1.5 tablets daily  . traMADol (ULTRAM) 50 MG tablet Take 1 tablet (50 mg total) by mouth 4 (four) times daily as needed for moderate pain.   No facility-administered encounter medications on file as of 10/12/2016.     1. GAD (generalized anxiety disorder)  Is currently on zolft which helps keep him from worrying  2. Migraine without aura and without status migrainosus, not intractable  Had css fluid drained which has helped with migraines. She is still taking her topamax daily  3. Cervical disc disorder with radiculopathy of cervical region  No c/o pain right now  4. Obsessive-compulsive disorder, unspecified type  Getting out of control. She had dropped her zoloft down to 100mg  a day and now she feels like she is out of control. Her kids are home for the summer and she is no longer in school and has nothing to occupy her. SHe has rearranged every room in her house, and she says it is driving her  husband crazy.    New complaints: none  Social history: Husband is out of work right now with an injury    Review of Systems  Constitutional: Negative for activity change and appetite change.  HENT: Negative.   Eyes: Negative for pain.  Respiratory: Negative for shortness of breath.   Cardiovascular: Negative for chest pain, palpitations and leg swelling.  Gastrointestinal: Negative for abdominal pain.  Endocrine: Negative for polydipsia.  Genitourinary: Negative.   Skin: Negative for rash.  Neurological: Negative for dizziness, weakness and headaches.  Hematological: Does not bruise/bleed easily.  Psychiatric/Behavioral: Negative.   All other systems reviewed and are negative.      Objective:   Physical Exam  Constitutional: She is oriented to person, place, and time. She appears well-developed and well-nourished.  HENT:  Nose: Nose normal.  Mouth/Throat: Oropharynx is clear and moist.  Eyes: EOM are normal.  Neck: Trachea normal, normal range of motion and full passive range of motion without pain. Neck supple. No JVD present. Carotid bruit is not present. No thyromegaly present.  Cardiovascular: Normal rate, regular rhythm, normal heart sounds and intact distal pulses.  Exam reveals no gallop and no friction rub.   No murmur heard. Pulmonary/Chest: Effort normal and breath sounds normal.  Abdominal: Soft. Bowel sounds are normal. She exhibits no distension and no mass. There is no tenderness.  Musculoskeletal: Normal range of motion.  Lymphadenopathy:  She has no cervical adenopathy.  Neurological: She is alert and oriented to person, place, and time. She has normal reflexes.  Skin: Skin is warm and dry.  Psychiatric: Judgment and thought content normal.  Tearful during exam figidity   BP 114/68   Pulse 83   Temp 98 F (36.7 C) (Oral)   Ht 5\' 10"  (1.778 m)   Wt 219 lb (99.3 kg)   BMI 31.42 kg/m       Assessment & Plan:  1. GAD (generalized anxiety  disorder) Stress management - sertraline (ZOLOFT) 100 MG tablet; Take 2 tablets (200 mg total) by mouth daily.  Dispense: 60 tablet; Refill: 5  2. Migraine without aura and without status migrainosus, not intractable Keep follow up with neurology  3. Cervical disc disorder with radiculopathy of cervical region  4. Obsessive-compulsive disorder, unspecified type Tips for control given in handout  5. Mixed obsessional thoughts and acts - sertraline (ZOLOFT) 100 MG tablet; Take 2 tablets (200 mg total) by mouth daily.  Dispense: 60 tablet; Refill: 5    Labs pending Health maintenance reviewed Diet and exercise encouraged Continue all meds Follow up  In 3 months   [place], FNP

## 2016-10-12 NOTE — Patient Instructions (Signed)
Obsessive-Compulsive Disorder Obsessive-compulsive disorder (OCD) is a brain-based anxiety disorder. People with OCD have obsessions, compulsions, or both. Obsessions are unwanted and distressing thoughts, ideas, or urges that keep entering your mind and result in anxiety. You may find yourself trying to ignore them. You may try to stop or undo them with a compulsion. Compulsions are repetitive physical or mental acts that you feel you have to do. They may reduce or prevent any emotional distress, but in most instances, they are ineffective. Compulsions can be very time-consuming, often taking more than one hour each day. They can interfere with personal relationships and normal activities at home, school, or work. OCD can begin in childhood, but it usually starts in young adulthood and continues throughout life. Many people with OCD also have depression or another mental health disorder. What are the causes? The cause of this condition is not known. What increases the risk? This condition is more like to develop in:  People who have experienced trauma.  People who have a family history of OCD.  Women during and after pregnancy.  People who have infections and post-infectious autoimmune syndrome.  People who have other mental health conditions.  People who abuse substances.  What are the signs or symptoms? Symptoms of OCD include compulsions and obsessions. People with obsessions usually have a fear that something terrible will happen or that they will do something terrible. Examples of common obsessions include:  Fear of contamination with germs, waste, or poisonous substances.  Fear of making the wrong decision.  Violent or sexual thoughts or urges towards others.  Need for symmetry or exactness.  Examples of common compulsions include:  Excessive handwashing or bathing due to fear of contamination.  Checking things over and over to make sure you finished a task, such as making  sure you locked a door or unplugged a toaster.  Repeating an act or phrase over and over, sometimes a specific number of times, until it feels right.  Arranging and rearranging objects to keep them in a certain order.  Having a very hard time making a decision and sticking to it.  How is this diagnosed? OCD is diagnosed through an assessment by your health care provider. Your health care provider will ask questions about any obsessions or compulsions you have and how they affect your life. Your health care provider may also ask about your medical history, prescription medicines, and drug use. Certain medical conditions and substances can cause symptoms that are similar to OCD. Your health care provider may also refer you to a mental health specialist. How is this treated? Treatment may include:  Cognitive therapy. This is a form of talk therapy. The goal is to identify and change the irrational thoughts associated with obsessions.  Behavioral therapy. A type of behavioral therapy called exposure and response prevention is often used. In this therapy, you will be exposed to the distressing situation that triggers your compulsion and be prevented from responding to it. With repetition of this process over time, you will no longer feel the distress or need to perform the compulsion.  Self-soothing. Meditation, deep breathing, or yoga can help you manage the physiological symptoms of anxiety and can help with how you think.  Medicine. Certain types of antidepressant medicine may help reduce or control OCD symptoms. Medicine is most effective when used with cognitive or behavioral therapy.  Treatment usually involves a combination of therapy and medicines. For severe OCD that does not respond to talk therapy and medicine, brain surgery  or electrical stimulation of specific areas of the brain (deep brain stimulation) may be considered. Follow these instructions at home:  Take over-the-counter and  prescription medicines only as told by your health care provider. Do not start taking any new medicines with approval from your health care provider.  Consider joining a support group for people with OCD.  Keep all follow-up visits as told by your health care provider. This is important. Contact a health care provider if:  You are not able to take your medicines as prescribed.  Your symptoms get worse. Get help right away if:  You have suicidal thoughts or thoughts about hurting yourself or others. If you ever feel like you may hurt yourself or others, or have thoughts about taking your own life, get help right away. You can go to your nearest emergency department or call:  Your local emergency services (911 in the [place]  A suicide crisis helpline, such as the [facility] at (269)022-2402. This is open 24-hours a day.  Summary  Obsessive-compulsive disorder (OCD) is a brain-based anxiety disorder. People with OCD have obsessions, compulsions, or both.  OCD can interfere with personal relationships and normal activities at home, school, or work.  Treatment usually involves a combination of therapy and medicines. This information is not intended to replace advice given to you by your health care provider. Make sure you discuss any questions you have with your health care provider. Document Released: 02/07/2001 Document Revised: 11/29/2015 Document Reviewed: 11/29/2015 Elsevier Interactive Patient Education  2018 [employer].

## 2017-02-14 ENCOUNTER — Other Ambulatory Visit: Payer: Self-pay

## 2017-02-14 ENCOUNTER — Encounter (HOSPITAL_COMMUNITY): Payer: Self-pay | Admitting: Emergency Medicine

## 2017-02-14 ENCOUNTER — Emergency Department (HOSPITAL_COMMUNITY)
Admission: EM | Admit: 2017-02-14 | Discharge: 2017-02-14 | Disposition: A | Discharge status: Home / Self Care | Payer: Medicaid Other | Attending: Emergency Medicine | Admitting: Emergency Medicine

## 2017-02-14 ENCOUNTER — Emergency Department (HOSPITAL_COMMUNITY): Payer: Medicaid Other

## 2017-02-14 DIAGNOSIS — R935 Abnormal findings on diagnostic imaging of other abdominal regions, including retroperitoneum: Secondary | ICD-10-CM | POA: Diagnosis not present

## 2017-02-14 DIAGNOSIS — Z87891 Personal history of nicotine dependence: Secondary | ICD-10-CM | POA: Diagnosis not present

## 2017-02-14 DIAGNOSIS — R0789 Other chest pain: Secondary | ICD-10-CM | POA: Diagnosis not present

## 2017-02-14 DIAGNOSIS — Z79899 Other long term (current) drug therapy: Secondary | ICD-10-CM | POA: Insufficient documentation

## 2017-02-14 DIAGNOSIS — R51 Headache: Secondary | ICD-10-CM | POA: Diagnosis present

## 2017-02-14 DIAGNOSIS — R109 Unspecified abdominal pain: Secondary | ICD-10-CM | POA: Insufficient documentation

## 2017-02-14 DIAGNOSIS — R160 Hepatomegaly, not elsewhere classified: Secondary | ICD-10-CM | POA: Diagnosis not present

## 2017-02-14 LAB — POC URINE PREG, ED: Preg Test, Ur: NEGATIVE

## 2017-02-14 LAB — COMPREHENSIVE METABOLIC PANEL
ALT: 14 U/L (ref 14–54)
AST: 14 U/L — AB (ref 15–41)
Albumin: 4.1 g/dL (ref 3.5–5.0)
Alkaline Phosphatase: 51 U/L (ref 38–126)
Anion gap: 9 (ref 5–15)
BILIRUBIN TOTAL: 0.5 mg/dL (ref 0.3–1.2)
BUN: 15 mg/dL (ref 6–20)
CALCIUM: 9.4 mg/dL (ref 8.9–10.3)
CO2: 23 mmol/L (ref 22–32)
CREATININE: 0.93 mg/dL (ref 0.44–1.00)
Chloride: 110 mmol/L (ref 101–111)
GLUCOSE: 114 mg/dL — AB (ref 65–99)
Potassium: 3.9 mmol/L (ref 3.5–5.1)
Sodium: 142 mmol/L (ref 135–145)
Total Protein: 7.5 g/dL (ref 6.5–8.1)

## 2017-02-14 LAB — CBC
HCT: 42.7 % (ref 36.0–46.0)
Hemoglobin: 13.5 g/dL (ref 12.0–15.0)
MCH: 28.2 pg (ref 26.0–34.0)
MCHC: 31.6 g/dL (ref 30.0–36.0)
MCV: 89.3 fL (ref 78.0–100.0)
PLATELETS: 243 10*3/uL (ref 150–400)
RBC: 4.78 MIL/uL (ref 3.87–5.11)
RDW: 13.2 % (ref 11.5–15.5)
WBC: 8.8 10*3/uL (ref 4.0–10.5)

## 2017-02-14 LAB — SAMPLE TO BLOOD BANK

## 2017-02-14 LAB — URINALYSIS, ROUTINE W REFLEX MICROSCOPIC
BILIRUBIN URINE: NEGATIVE
Glucose, UA: NEGATIVE mg/dL
HGB URINE DIPSTICK: NEGATIVE
Ketones, ur: NEGATIVE mg/dL
Leukocytes, UA: NEGATIVE
Nitrite: NEGATIVE
PROTEIN: NEGATIVE mg/dL
Specific Gravity, Urine: 1.013 (ref 1.005–1.030)
pH: 7 (ref 5.0–8.0)

## 2017-02-14 LAB — I-STAT CG4 LACTIC ACID, ED: LACTIC ACID, VENOUS: 1.09 mmol/L (ref 0.5–1.9)

## 2017-02-14 LAB — PROTIME-INR
INR: 0.83
PROTHROMBIN TIME: 11.4 s (ref 11.4–15.2)

## 2017-02-14 MED ORDER — FENTANYL CITRATE (PF) 100 MCG/2ML IJ SOLN
50.0000 ug | Freq: Once | INTRAMUSCULAR | Status: AC
  Administered 2017-02-14: 50 ug via INTRAVENOUS
  Filled 2017-02-14: qty 2

## 2017-02-14 MED ORDER — IBUPROFEN 800 MG PO TABS: 800.0000 mg | ORAL_TABLET | Freq: Three times a day (TID) | ORAL | 0 refills | Status: DC

## 2017-02-14 MED ORDER — IOPAMIDOL (ISOVUE-300) INJECTION 61%
100.0000 mL | Freq: Once | INTRAVENOUS | Status: AC | PRN
  Administered 2017-02-14: 100 mL via INTRAVENOUS

## 2017-02-14 NOTE — ED Notes (Signed)
Tina,RN, charge nurse called [facility] To inquire on giving pt son booster seat for ride home after discharge. Pt and family a/w for this in lobby.
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
## 2017-02-15 ENCOUNTER — Ambulatory Visit: Payer: Medicaid Other | Admitting: Family Medicine

## 2017-02-15 ENCOUNTER — Encounter: Payer: Self-pay | Admitting: Family Medicine

## 2017-02-15 VITALS — BP 107/72 | HR 78 | Temp 97.5°F | Ht 69.0 in | Wt 222.4 lb

## 2017-02-15 DIAGNOSIS — R160 Hepatomegaly, not elsewhere classified: Secondary | ICD-10-CM

## 2017-02-15 DIAGNOSIS — K769 Liver disease, unspecified: Secondary | ICD-10-CM

## 2017-02-15 DIAGNOSIS — M549 Dorsalgia, unspecified: Secondary | ICD-10-CM

## 2017-02-15 MED ORDER — NAPROXEN 500 MG PO TABS: 500.0000 mg | ORAL_TABLET | Freq: Two times a day (BID) | ORAL | 0 refills | Status: DC

## 2017-02-15 MED ORDER — TRAMADOL HCL 50 MG PO TABS: 50.0000 mg | ORAL_TABLET | Freq: Four times a day (QID) | ORAL | 0 refills | Status: DC | PRN

## 2017-02-15 MED ORDER — CYCLOBENZAPRINE HCL 10 MG PO TABS: 10.0000 mg | ORAL_TABLET | Freq: Three times a day (TID) | ORAL | 0 refills | Status: DC | PRN

## 2017-02-15 NOTE — Patient Instructions (Signed)
Stop ibuprofen Take naproxen twice daily with food, use Flexeril at night and during the daytime cautiously.  Do not drive after taking it.  We will work on scheduling an MRI for you
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
## 2017-02-16 ENCOUNTER — Ambulatory Visit: Payer: Medicaid Other | Admitting: Neurology

## 2017-03-05 ENCOUNTER — Encounter: Payer: Self-pay | Admitting: Neurology

## 2017-03-05 ENCOUNTER — Ambulatory Visit (INDEPENDENT_AMBULATORY_CARE_PROVIDER_SITE_OTHER): Payer: Medicaid Other | Admitting: Neurology

## 2017-03-05 VITALS — BP 100/70 | HR 94 | Ht 69.0 in | Wt 225.4 lb

## 2017-03-05 DIAGNOSIS — R51 Headache: Secondary | ICD-10-CM | POA: Diagnosis not present

## 2017-03-05 DIAGNOSIS — G43709 Chronic migraine without aura, not intractable, without status migrainosus: Secondary | ICD-10-CM

## 2017-03-05 DIAGNOSIS — R519 Headache, unspecified: Secondary | ICD-10-CM

## 2017-03-05 MED ORDER — CYCLOBENZAPRINE HCL 10 MG PO TABS: 10.0000 mg | ORAL_TABLET | Freq: Every evening | ORAL | 1 refills | Status: DC | PRN
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
## 2017-03-05 NOTE — Patient Instructions (Addendum)
1.  Continue topirmate 75mg  daily 2.  OK to use maxalt 10mg  for severe migraines   Return to clinic in 6 months

## 2017-03-12 ENCOUNTER — Ambulatory Visit (HOSPITAL_COMMUNITY)
Admission: RE | Admit: 2017-03-12 | Discharge: 2017-03-12 | Disposition: A | Discharge status: Home / Self Care | Payer: Medicaid Other | Source: Ambulatory Visit | Attending: Family Medicine | Admitting: Family Medicine

## 2017-03-12 ENCOUNTER — Telehealth: Payer: Self-pay | Admitting: Family Medicine

## 2017-03-12 DIAGNOSIS — K769 Liver disease, unspecified: Secondary | ICD-10-CM

## 2017-03-12 DIAGNOSIS — D1803 Hemangioma of intra-abdominal structures: Secondary | ICD-10-CM | POA: Diagnosis not present

## 2017-03-12 DIAGNOSIS — D1800 Hemangioma unspecified site: Secondary | ICD-10-CM

## 2017-03-12 HISTORY — DX: Hemangioma unspecified site: D18.00

## 2017-03-12 MED ORDER — GADOXETATE DISODIUM 0.25 MMOL/ML IV SOLN
10.0000 mL | Freq: Once | INTRAVENOUS | Status: AC | PRN
  Administered 2017-03-12: 10 mL via INTRAVENOUS

## 2017-03-12 NOTE — Telephone Encounter (Signed)
Patient aware of MRI results  

## 2017-05-07 ENCOUNTER — Ambulatory Visit (INDEPENDENT_AMBULATORY_CARE_PROVIDER_SITE_OTHER): Payer: Medicaid Other | Admitting: *Deleted

## 2017-05-07 DIAGNOSIS — Z23 Encounter for immunization: Secondary | ICD-10-CM

## 2017-05-07 DIAGNOSIS — Z111 Encounter for screening for respiratory tuberculosis: Secondary | ICD-10-CM | POA: Diagnosis not present

## 2017-05-07 NOTE — Progress Notes (Signed)
PPD placed L arm Pt tolerated well 

## 2017-05-09 ENCOUNTER — Ambulatory Visit: Payer: Medicaid Other | Admitting: Family Medicine

## 2017-05-10 ENCOUNTER — Encounter: Payer: Self-pay | Admitting: Family Medicine

## 2017-05-10 LAB — TB SKIN TEST
INDURATION: 0 mm
TB SKIN TEST: NEGATIVE

## 2017-05-14 ENCOUNTER — Ambulatory Visit: Payer: Medicaid Other | Admitting: Family Medicine

## 2017-05-14 ENCOUNTER — Encounter: Payer: Self-pay | Admitting: Family Medicine

## 2017-05-14 VITALS — BP 107/71 | HR 83 | Temp 99.1°F | Ht 69.0 in | Wt 228.0 lb

## 2017-05-14 DIAGNOSIS — Z0000 Encounter for general adult medical examination without abnormal findings: Secondary | ICD-10-CM | POA: Diagnosis not present

## 2017-05-14 DIAGNOSIS — Z01419 Encounter for gynecological examination (general) (routine) without abnormal findings: Secondary | ICD-10-CM
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
## 2017-05-15 LAB — PAP IG, RFX HPV ALL PTH: PAP SMEAR COMMENT: 0

## 2017-06-11 ENCOUNTER — Ambulatory Visit: Payer: Medicaid Other | Admitting: Family Medicine

## 2017-06-11 ENCOUNTER — Encounter: Payer: Self-pay | Admitting: Family Medicine

## 2017-06-11 VITALS — BP 110/66 | Temp 97.4°F | Ht 69.0 in | Wt 227.1 lb

## 2017-06-11 DIAGNOSIS — J020 Streptococcal pharyngitis: Secondary | ICD-10-CM

## 2017-06-11 DIAGNOSIS — J029 Acute pharyngitis, unspecified: Secondary | ICD-10-CM

## 2017-06-11 LAB — RAPID STREP SCREEN (MED CTR MEBANE ONLY): STREP GP A AG, IA W/REFLEX: NEGATIVE

## 2017-06-11 LAB — CULTURE, GROUP A STREP

## 2017-06-11 MED ORDER — AZITHROMYCIN 250 MG PO TABS: ORAL_TABLET | ORAL | 0 refills | Status: DC

## 2017-06-11 NOTE — Progress Notes (Signed)
Chief Complaint  Patient presents with  . Sore Throat    pt here today c/o sore throat and a fever last night of 100.2 and stiff neck    HPI  Patient presents today for 3 days of increasing sore throat and fever.  Her son has strep throat.  She has had minimal cough and some soreness in the neck.  Patient has had tonsillectomy in the past.  PMH: Smoking status noted ROS: Per HPI  Objective: Temp (!) 97.4 F (36.3 C) (Oral)   Ht 5\' 9"  (1.753 m)   Wt 227 lb 2 oz (103 kg)   BMI 33.54 kg/m  Gen: NAD, alert, cooperative with exam HEENT: NCAT, EOMI, PERRL.  TMs are clear.  The pharynx has posterior erythema with soft palate petechiae. CV: RRR, good S1/S2, no murmur Resp: CTABL, no wheezes, non-labored Abd: SNTND, BS present, no guarding or organomegaly Ext: No edema, warm Neuro: Alert and oriented, No gross deficits Rapid strep is negative. Assessment and plan:  1. Sore throat   2. Streptococcal pharyngitis     Meds ordered this encounter  Medications  . azithromycin (ZITHROMAX Z-PAK) 250 MG tablet    Sig: Take two right away Then one a day for the next 4 days.    Dispense:  6 each    Refill:  0    Orders Placed This Encounter  Procedures  . Rapid Strep Screen ([facility] ONLY)    Follow up as needed.  Claretta Fraise, MD

## 2017-07-30 ENCOUNTER — Other Ambulatory Visit: Payer: Self-pay | Admitting: Nurse Practitioner

## 2017-07-30 DIAGNOSIS — F411 Generalized anxiety disorder: Secondary | ICD-10-CM

## 2017-07-30 DIAGNOSIS — F422 Mixed obsessional thoughts and acts: Secondary | ICD-10-CM

## 2017-08-29 ENCOUNTER — Ambulatory Visit: Payer: Medicaid Other | Admitting: Neurology

## 2017-08-29 ENCOUNTER — Telehealth: Payer: Self-pay | Admitting: *Deleted

## 2017-08-29 ENCOUNTER — Encounter: Payer: Self-pay | Admitting: Neurology

## 2017-08-29 ENCOUNTER — Encounter: Payer: Self-pay | Admitting: *Deleted

## 2017-08-29 ENCOUNTER — Other Ambulatory Visit (INDEPENDENT_AMBULATORY_CARE_PROVIDER_SITE_OTHER): Payer: Medicaid Other

## 2017-08-29 VITALS — BP 100/80 | HR 77 | Ht 69.0 in | Wt 224.5 lb

## 2017-08-29 DIAGNOSIS — R209 Unspecified disturbances of skin sensation: Secondary | ICD-10-CM

## 2017-08-29 DIAGNOSIS — R519 Headache, unspecified: Secondary | ICD-10-CM

## 2017-08-29 DIAGNOSIS — R202 Paresthesia of skin: Secondary | ICD-10-CM

## 2017-08-29 DIAGNOSIS — R51 Headache: Secondary | ICD-10-CM

## 2017-08-29 LAB — SEDIMENTATION RATE: Sed Rate: 24 mm/hr — ABNORMAL HIGH (ref 0–20)

## 2017-08-29 LAB — C-REACTIVE PROTEIN: CRP: 0.1 mg/dL — ABNORMAL LOW (ref 0.5–20.0)

## 2017-08-29 MED ORDER — TOPIRAMATE 50 MG PO TABS: ORAL_TABLET | ORAL | 11 refills | Status: DC

## 2017-08-29 MED ORDER — RIZATRIPTAN BENZOATE 10 MG PO TABS: ORAL_TABLET | ORAL | 11 refills | Status: DC

## 2017-08-29 MED ORDER — KETOROLAC TROMETHAMINE 60 MG/2ML IM SOLN
30.0000 mg | Freq: Once | INTRAMUSCULAR | Status: AC
  Administered 2017-08-29: 30 mg via INTRAMUSCULAR

## 2017-08-29 MED ORDER — CYCLOBENZAPRINE HCL 10 MG PO TABS: 10.0000 mg | ORAL_TABLET | Freq: Every evening | ORAL | 5 refills | Status: DC | PRN

## 2017-08-29 NOTE — Telephone Encounter (Signed)
Results sent via My Chart.  

## 2017-08-29 NOTE — Patient Instructions (Addendum)
Check labs   Continue medications as you are taking  Return to clinic in 6 months

## 2017-08-29 NOTE — Telephone Encounter (Signed)
-----   Message from Cameron Sprang, MD sent at 08/29/2017 12:59 PM EDT ----- Pls let her know bloodwork for inflammation was unremarkable. Thanks
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
## 2017-09-10 ENCOUNTER — Ambulatory Visit: Payer: Medicaid Other | Admitting: Neurology

## 2017-10-01 ENCOUNTER — Telehealth: Payer: Self-pay | Admitting: Neurology

## 2017-10-01 NOTE — Telephone Encounter (Signed)
Notes rec'd from [facility].  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    Plan to get MRI brain wwo contrast and then proceed with large volume LP.  Nan Maya K. Posey Pronto, DO

## 2017-10-02 ENCOUNTER — Other Ambulatory Visit: Payer: Self-pay | Admitting: *Deleted

## 2017-10-02 DIAGNOSIS — H4710 Unspecified papilledema: Secondary | ICD-10-CM

## 2017-10-02 NOTE — Telephone Encounter (Signed)
I spoke with patient to let her know our plan.  MRI and LP orders placed.  Called [facility] and they will contact patient today for MRI appointment.  Requested PA for MRI.

## 2017-10-06 ENCOUNTER — Ambulatory Visit
Admission: RE | Admit: 2017-10-06 | Discharge: 2017-10-06 | Disposition: A | Discharge status: Home / Self Care | Payer: Medicaid Other | Source: Ambulatory Visit | Attending: Neurology | Admitting: Neurology

## 2017-10-06 DIAGNOSIS — H4710 Unspecified papilledema: Secondary | ICD-10-CM

## 2017-10-06 MED ORDER — GADOBENATE DIMEGLUMINE 529 MG/ML IV SOLN
20.0000 mL | Freq: Once | INTRAVENOUS | Status: AC | PRN
  Administered 2017-10-06: 20 mL via INTRAVENOUS

## 2017-10-08 ENCOUNTER — Telehealth: Payer: Self-pay | Admitting: Neurology

## 2017-10-08 NOTE — Telephone Encounter (Signed)
Patient called regarding MRI results. Thanks
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
## 2017-10-09 ENCOUNTER — Other Ambulatory Visit: Payer: Self-pay | Admitting: *Deleted

## 2017-10-09 DIAGNOSIS — G43709 Chronic migraine without aura, not intractable, without status migrainosus: Secondary | ICD-10-CM

## 2017-10-09 DIAGNOSIS — R202 Paresthesia of skin: Secondary | ICD-10-CM

## 2017-10-09 DIAGNOSIS — R209 Unspecified disturbances of skin sensation: Secondary | ICD-10-CM

## 2017-10-09 DIAGNOSIS — H4710 Unspecified papilledema: Secondary | ICD-10-CM

## 2017-10-09 DIAGNOSIS — R200 Anesthesia of skin: Secondary | ICD-10-CM

## 2017-10-09 NOTE — Telephone Encounter (Signed)
Lab orders placed.  

## 2017-10-10 ENCOUNTER — Other Ambulatory Visit (INDEPENDENT_AMBULATORY_CARE_PROVIDER_SITE_OTHER): Payer: Medicaid Other

## 2017-10-10 ENCOUNTER — Ambulatory Visit
Admission: RE | Admit: 2017-10-10 | Discharge: 2017-10-10 | Disposition: A | Discharge status: Home / Self Care | Payer: Medicaid Other | Source: Ambulatory Visit | Attending: Neurology | Admitting: Neurology

## 2017-10-10 VITALS — BP 112/64 | HR 58

## 2017-10-10 DIAGNOSIS — R209 Unspecified disturbances of skin sensation: Secondary | ICD-10-CM

## 2017-10-10 DIAGNOSIS — H4710 Unspecified papilledema: Secondary | ICD-10-CM

## 2017-10-10 DIAGNOSIS — R200 Anesthesia of skin: Secondary | ICD-10-CM | POA: Diagnosis not present

## 2017-10-10 DIAGNOSIS — G43709 Chronic migraine without aura, not intractable, without status migrainosus: Secondary | ICD-10-CM

## 2017-10-10 DIAGNOSIS — R202 Paresthesia of skin: Secondary | ICD-10-CM | POA: Diagnosis not present

## 2017-10-10 LAB — CK: Total CK: 48 U/L (ref 7–177)

## 2017-10-10 LAB — SEDIMENTATION RATE: SED RATE: 41 mm/h — AB (ref 0–20)

## 2017-10-10 LAB — C-REACTIVE PROTEIN: CRP: 0.1 mg/dL — AB (ref 0.5–20.0)

## 2017-10-10 NOTE — Progress Notes (Signed)
One SST tube of blood drawn from right [facility] space for LP labs; site unremarkable.  Gypsy Lore, RN

## 2017-10-10 NOTE — Discharge Instructions (Signed)

## 2017-10-11 ENCOUNTER — Telehealth: Payer: Self-pay | Admitting: Neurology

## 2017-10-11 NOTE — Telephone Encounter (Signed)
Dorothy called from [facility] with STAT results she also will fax a copy as well.  Ref # [id number]. Thanks

## 2017-10-12 ENCOUNTER — Telehealth: Payer: Self-pay | Admitting: Neurology

## 2017-10-12 LAB — ALDOLASE: ALDOLASE: 4 U/L (ref <=8.1)

## 2017-10-12 NOTE — Telephone Encounter (Signed)
Called patient with results of her spinal tap which shows normal opening pressure and cell count, protein, and glucose.   I have asked her to follow-up with eye doctor to reassess optic nerve to be sure this is not pseudopapilledema given normal ICP pressure.   Because of the hyperintensity seen on MRI brain involving the left temporal region and possible arteritis, repeat ESR was check and is 41, previously 24.  Normal CK and CRP.  With her localized symptoms and mildly elevated ESR, will proceed with left temporal artery biopsy.    Erica K. Posey Pronto, DO

## 2017-10-13 LAB — PROTEIN, CSF: Total Protein, CSF: 26 mg/dL (ref 15–45)

## 2017-10-13 LAB — GLUCOSE, CSF: GLUCOSE CSF: 69 mg/dL (ref 40–80)

## 2017-10-13 LAB — CNS IGG SYNTHESIS RATE, CSF+BLOOD
Albumin Serum: 4.3 g/dL (ref 3.5–5.2)
Albumin, CSF: 9.4 mg/dL (ref 8.0–42.0)
CNS-IgG Synthesis Rate: -3.8 mg/24 h (ref <=3.3)
IGG (IMMUNOGLOBIN G), SERUM: 1160 mg/dL (ref 600–1640)
IGG-INDEX: 0.51 (ref <0.66)
IgG Total CSF: 1.3 mg/dL (ref 0.8–7.7)

## 2017-10-13 LAB — CSF CELL COUNT WITH DIFFERENTIAL
RBC Count, CSF: 4 cells/uL (ref 0–10)
WBC, CSF: 1 cells/uL (ref 0–5)

## 2017-10-16 ENCOUNTER — Other Ambulatory Visit: Payer: Self-pay | Admitting: *Deleted

## 2017-10-16 ENCOUNTER — Telehealth: Payer: Self-pay | Admitting: Neurology

## 2017-10-16 DIAGNOSIS — R51 Headache: Principal | ICD-10-CM

## 2017-10-16 DIAGNOSIS — R519 Headache, unspecified: Secondary | ICD-10-CM

## 2017-10-16 DIAGNOSIS — H4710 Unspecified papilledema: Secondary | ICD-10-CM

## 2017-10-16 NOTE — Telephone Encounter (Signed)
Order ready to be faxed when fax is back up.

## 2017-10-17 ENCOUNTER — Encounter: Payer: Self-pay | Admitting: Advanced Practice Midwife

## 2017-10-17 ENCOUNTER — Ambulatory Visit: Payer: Self-pay | Admitting: General Surgery

## 2017-10-17 ENCOUNTER — Ambulatory Visit: Payer: Medicaid Other | Admitting: Advanced Practice Midwife

## 2017-10-17 VITALS — BP 94/66 | HR 79 | Ht 69.0 in | Wt 227.0 lb

## 2017-10-17 DIAGNOSIS — Z3046 Encounter for surveillance of implantable subdermal contraceptive: Secondary | ICD-10-CM | POA: Insufficient documentation

## 2017-10-17 DIAGNOSIS — Z3049 Encounter for surveillance of other contraceptives: Secondary | ICD-10-CM
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
## 2017-10-17 NOTE — Patient Instructions (Signed)
What You Need to Know About Female Sterilization Female sterilization is surgery to prevent pregnancy. In this surgery, the fallopian tubes are either blocked or closed off. This prevents eggs from reaching the uterus so that the eggs cannot be fertilized by sperm and you cannot get pregnant. Sterilization is permanent. It should only be done if you are sure that you do not want to be able to have children. What are the sterilization surgery options? There are several kinds of female sterilization surgeries. They include:  Laparoscopic tubal ligation. In this surgery, the fallopian tubes are tied off, sealed with heat, or blocked with a clip, ring, or clamp. A small portion of each fallopian tube may also be removed. This surgery is done through several small cuts (incisions).  Postpartum tubal ligation. This is also called a mini-laparotomy. This surgery is done right after childbirth or 1 or 2 days after childbirth. In this surgery, the fallopian tubes are tied off, sealed with heat, or blocked with a clip, ring, or clamp. A small portion of each fallopian tube may also be removed. The surgery is done through a single incision.  Hysteroscopic sterilization. In this surgery, a tiny, spring-like coil is inserted through the cervix and uterus into the fallopian tubes. The coil causes scarring, which blocks the tubes. After the surgery, contraception should be used for 3 months to allow the scar tissue to form completely.  Is sterilization safe? Generally, sterilization is safe. Complications are rare. However, there are risks. They include:  Bleeding.  Infection.  Reaction to medicine used during the procedure.  Injury to surrounding organs.  Failure of the procedure.  How effective is sterilization? Sterilization is nearly 100% effective, but it can fail. Also, the fallopian tubes can grow back together over time. If this happens, you will be able to get pregnant again. Women who have had  this procedure have a higher chance of having an ectopic pregnancy. An ectopic pregnancy is a pregnancy that happens outside of the uterus. This kind of pregnancy is unsuccessful and can lead to serious bleeding if it is not treated. What are the benefits?  It is usually effective for a lifetime.  It is usually safe.  It does not have the drawbacks of other types of birth control: That means: ? Your hormones are not affected. Because of this, your menstrual periods, sexual desire, and sexual performance will not be affected. ? There are no side effects. What are the drawbacks?  If you change your mind and decide that you want to have children, you may not be able to. Sterilization may be reversed, but a reversal is not always successful.  It does not provide protection against STDs (sexually transmitted diseases).  It increases the chance of having an ectopic pregnancy. This information is not intended to replace advice given to you by your health care provider. Make sure you discuss any questions you have with your health care provider. Document Released: 08/02/2007 Document Revised: 10/07/2015 Document Reviewed: 11/10/2014 Elsevier Interactive Patient Education  2018 [employer]vier Inc.  

## 2017-10-18 ENCOUNTER — Encounter (HOSPITAL_BASED_OUTPATIENT_CLINIC_OR_DEPARTMENT_OTHER): Payer: Self-pay

## 2017-10-18 ENCOUNTER — Other Ambulatory Visit: Payer: Self-pay

## 2017-10-18 NOTE — Telephone Encounter (Signed)
Referral faxed to [facility] at [phone] with confirmation received.

## 2017-10-18 NOTE — Patient Instructions (Signed)
Your procedure is scheduled on:  Thursday, Aug. 29, 2019  Report to [facility] AT  8:30 A. M.   Call this number if you have problems the morning of surgery:[phone].   OUR ADDRESS IS [place].  WE ARE LOCATED IN THE [place]                                   [facility].                                     REMEMBER:  DO NOT EAT FOOD OR DRINK LIQUIDS AFTER MIDNIGHT .   DRINK 1 PRE-SURGICAL DRINK AT 7:30AM MORNING OF SURGERY   TAKE THESE MEDICATIONS MORNING OF SURGERY WITH A SIP OF WATER:  None  DO NOT WEAR JEWERLY, MAKE UP, OR NAIL POLISH.  DO NOT WEAR LOTIONS, POWDERS, PERFUMES/COLOGNE OR DEODORANT.  DO NOT SHAVE FOR 24 HOURS PRIOR TO DAY OF SURGERY.  CONTACTS, GLASSES, OR DENTURES MAY NOT BE WORN TO SURGERY.                                    [facility] IS NOT RESPONSIBLE  FOR ANY BELONGINGS.       [facility] - Preparing for Surgery Before surgery, you can play an important role.  Because skin is not sterile, your skin needs to be as free of germs as possible.  You can reduce the number of germs on your skin by washing with CHG (chlorahexidine gluconate) soap before surgery.  CHG is an antiseptic cleaner which kills germs and bonds with the skin to continue killing germs even after washing. Please DO NOT use if you have an allergy to CHG or antibacterial soaps.  If your skin becomes reddened/irritated stop using the CHG and inform your nurse when you arrive at Short Stay. Do not shave (including legs and underarms) for at least 48 hours prior to the first CHG shower.  You may shave your face/neck.  Please follow these instructions carefully:  1.  Shower with CHG Soap the night before surgery and the  morning of surgery.  2.  If you choose to wash your hair, wash your hair first as usual with your normal  shampoo.  3.  After you shampoo, rinse your hair and body thoroughly to remove the shampoo.                             4.  Use CHG as you  would any other liquid soap.  You can apply chg directly to the skin and wash.  Gently with a scrungie or clean washcloth.  5.  Apply the CHG Soap to your body ONLY FROM THE NECK DOWN.   Do   not use on face/ open                           Wound or open sores. Avoid contact with eyes, ears mouth and   genitals (private parts).                       Wash face,  Genitals (private parts) with your  normal soap.             6.  Wash thoroughly, paying special attention to the area where your    surgery  will be performed.  7.  Thoroughly rinse your body with warm water from the neck down.  8.  DO NOT shower/wash with your normal soap after using and rinsing off the CHG Soap.                9.  Pat yourself dry with a clean towel.            10.  Wear clean pajamas.            11.  Place clean sheets on your bed the night of your first shower and do not  sleep with pets. Day of Surgery : Do not apply any lotions/deodorants the morning of surgery.  Please wear clean clothes to the hospital/surgery center.  FAILURE TO FOLLOW THESE INSTRUCTIONS MAY RESULT IN THE CANCELLATION OF YOUR SURGERY  PATIENT SIGNATURE_________________________________  NURSE SIGNATURE__________________________________  ________________________________________________________________________                                                                  .

## 2017-10-18 NOTE — Progress Notes (Signed)
Spoke with: Levada Dy NPO:  After Midnight, no gum, candy, or mints   Arrival time:  0830AM Labs:  UPT AM medications: Pre surgical drink Pre op orders:  Yes Ride home:  [place] ([place] [phone]

## 2017-10-25 ENCOUNTER — Ambulatory Visit (HOSPITAL_BASED_OUTPATIENT_CLINIC_OR_DEPARTMENT_OTHER): Payer: Medicaid Other | Admitting: Anesthesiology

## 2017-10-25 ENCOUNTER — Encounter (HOSPITAL_BASED_OUTPATIENT_CLINIC_OR_DEPARTMENT_OTHER): Payer: Self-pay | Admitting: Anesthesiology

## 2017-10-25 ENCOUNTER — Encounter (HOSPITAL_BASED_OUTPATIENT_CLINIC_OR_DEPARTMENT_OTHER)
Admission: RE | Disposition: A | Discharge status: Home / Self Care | Payer: Self-pay | Source: Ambulatory Visit | Attending: General Surgery

## 2017-10-25 ENCOUNTER — Ambulatory Visit (HOSPITAL_BASED_OUTPATIENT_CLINIC_OR_DEPARTMENT_OTHER)
Admission: RE | Admit: 2017-10-25 | Discharge: 2017-10-25 | Disposition: A | Discharge status: Home / Self Care | Payer: Medicaid Other | Source: Ambulatory Visit | Attending: General Surgery | Admitting: General Surgery

## 2017-10-25 DIAGNOSIS — G43909 Migraine, unspecified, not intractable, without status migrainosus: Secondary | ICD-10-CM | POA: Insufficient documentation

## 2017-10-25 DIAGNOSIS — E669 Obesity, unspecified: Secondary | ICD-10-CM | POA: Diagnosis not present

## 2017-10-25 DIAGNOSIS — Z87891 Personal history of nicotine dependence: Secondary | ICD-10-CM | POA: Diagnosis not present

## 2017-10-25 DIAGNOSIS — M5430 Sciatica, unspecified side: Secondary | ICD-10-CM | POA: Insufficient documentation

## 2017-10-25 DIAGNOSIS — R51 Headache: Secondary | ICD-10-CM | POA: Diagnosis present

## 2017-10-25 DIAGNOSIS — F419 Anxiety disorder, unspecified: Secondary | ICD-10-CM | POA: Insufficient documentation

## 2017-10-25 DIAGNOSIS — F428 Other obsessive-compulsive disorder: Secondary | ICD-10-CM | POA: Diagnosis not present

## 2017-10-25 DIAGNOSIS — Z6834 Body mass index (BMI) 34.0-34.9, adult: Secondary | ICD-10-CM | POA: Diagnosis not present

## 2017-10-25 DIAGNOSIS — M5412 Radiculopathy, cervical region: Secondary | ICD-10-CM | POA: Insufficient documentation

## 2017-10-25 HISTORY — DX: Hemangioma unspecified site: D18.00

## 2017-10-25 HISTORY — DX: Personal history of other diseases of the digestive system: Z87.19

## 2017-10-25 HISTORY — DX: Sciatica, unspecified side: M54.30

## 2017-10-25 HISTORY — DX: Endometriosis, unspecified: N80.9

## 2017-10-25 HISTORY — DX: Presence of spectacles and contact lenses: Z97.3

## 2017-10-25 HISTORY — DX: Migraine, unspecified, not intractable, without status migrainosus: G43.909

## 2017-10-25 HISTORY — DX: Nausea with vomiting, unspecified: R11.2

## 2017-10-25 HISTORY — DX: Fracture of unspecified phalanx of unspecified finger, initial encounter for closed fracture: S62.609A

## 2017-10-25 HISTORY — DX: Umbilical hernia without obstruction or gangrene: K42.9

## 2017-10-25 HISTORY — DX: Unspecified ovarian cyst, unspecified side: N83.209

## 2017-10-25 HISTORY — PX: ARTERY BIOPSY: SHX891

## 2017-10-25 HISTORY — DX: Radiculopathy, cervical region: M54.12

## 2017-10-25 HISTORY — DX: Other specified postprocedural states: Z98.890

## 2017-10-25 HISTORY — DX: Anemia, unspecified: D64.9

## 2017-10-25 LAB — POCT PREGNANCY, URINE: PREG TEST UR: NEGATIVE

## 2017-10-25 SURGERY — BIOPSY TEMPORAL ARTERY
Anesthesia: Monitor Anesthesia Care | Laterality: Left

## 2017-10-25 MED ORDER — CLINDAMYCIN PHOSPHATE 900 MG/50ML IV SOLN
INTRAVENOUS | Status: AC
  Filled 2017-10-25: qty 50

## 2017-10-25 MED ORDER — OXYCODONE HCL 5 MG PO TABS
5.0000 mg | ORAL_TABLET | Freq: Once | ORAL | Status: DC | PRN
  Filled 2017-10-25: qty 1

## 2017-10-25 MED ORDER — HYDROCODONE-ACETAMINOPHEN 5-325 MG PO TABS: 1.0000 | ORAL_TABLET | Freq: Four times a day (QID) | ORAL | 0 refills | Status: DC | PRN

## 2017-10-25 MED ORDER — CLINDAMYCIN PHOSPHATE 900 MG/50ML IV SOLN
900.0000 mg | INTRAVENOUS | Status: AC
  Administered 2017-10-25: 900 mg via INTRAVENOUS
  Filled 2017-10-25: qty 50

## 2017-10-25 MED ORDER — LIDOCAINE 2% (20 MG/ML) 5 ML SYRINGE
INTRAMUSCULAR | Status: AC
  Filled 2017-10-25: qty 5

## 2017-10-25 MED ORDER — MIDAZOLAM HCL 5 MG/5ML IJ SOLN
INTRAMUSCULAR | Status: DC | PRN
  Administered 2017-10-25: 2 mg via INTRAVENOUS

## 2017-10-25 MED ORDER — PROPOFOL 10 MG/ML IV BOLUS
INTRAVENOUS | Status: DC | PRN
  Administered 2017-10-25: 40 mg via INTRAVENOUS

## 2017-10-25 MED ORDER — CELECOXIB 200 MG PO CAPS
ORAL_CAPSULE | ORAL | Status: AC
  Filled 2017-10-25: qty 1

## 2017-10-25 MED ORDER — HYDROMORPHONE HCL 1 MG/ML IJ SOLN
0.2500 mg | INTRAMUSCULAR | Status: DC | PRN
  Filled 2017-10-25: qty 0.5

## 2017-10-25 MED ORDER — PROPOFOL 10 MG/ML IV BOLUS
INTRAVENOUS | Status: AC
  Filled 2017-10-25: qty 20

## 2017-10-25 MED ORDER — MIDAZOLAM HCL 2 MG/2ML IJ SOLN
INTRAMUSCULAR | Status: AC
  Filled 2017-10-25: qty 2

## 2017-10-25 MED ORDER — ACETAMINOPHEN 500 MG PO TABS
1000.0000 mg | ORAL_TABLET | ORAL | Status: AC
  Administered 2017-10-25: 1000 mg via ORAL
  Filled 2017-10-25: qty 2

## 2017-10-25 MED ORDER — PROMETHAZINE HCL 25 MG/ML IJ SOLN
6.2500 mg | INTRAMUSCULAR | Status: DC | PRN
  Filled 2017-10-25: qty 1

## 2017-10-25 MED ORDER — OXYCODONE HCL 5 MG/5ML PO SOLN
5.0000 mg | Freq: Once | ORAL | Status: DC | PRN
  Filled 2017-10-25: qty 5

## 2017-10-25 MED ORDER — SODIUM CHLORIDE 0.9 % IR SOLN
Status: DC | PRN
  Administered 2017-10-25: 500 mL

## 2017-10-25 MED ORDER — FENTANYL CITRATE (PF) 100 MCG/2ML IJ SOLN
INTRAMUSCULAR | Status: AC
  Filled 2017-10-25: qty 2

## 2017-10-25 MED ORDER — ACETAMINOPHEN 500 MG PO TABS
ORAL_TABLET | ORAL | Status: AC
  Filled 2017-10-25: qty 2

## 2017-10-25 MED ORDER — CELECOXIB 200 MG PO CAPS
200.0000 mg | ORAL_CAPSULE | ORAL | Status: AC
  Administered 2017-10-25: 200 mg via ORAL
  Filled 2017-10-25: qty 1

## 2017-10-25 MED ORDER — LACTATED RINGERS IV SOLN
INTRAVENOUS | Status: DC
  Administered 2017-10-25: 10:00:00 via INTRAVENOUS
  Filled 2017-10-25: qty 1000

## 2017-10-25 MED ORDER — FENTANYL CITRATE (PF) 100 MCG/2ML IJ SOLN
INTRAMUSCULAR | Status: DC | PRN
  Administered 2017-10-25: 100 ug via INTRAVENOUS

## 2017-10-25 MED ORDER — CHLORHEXIDINE GLUCONATE CLOTH 2 % EX PADS
6.0000 | MEDICATED_PAD | Freq: Once | CUTANEOUS | Status: DC
  Filled 2017-10-25: qty 6

## 2017-10-25 MED ORDER — BUPIVACAINE HCL 0.5 % IJ SOLN
INTRAMUSCULAR | Status: DC | PRN
  Administered 2017-10-25: 7 mL

## 2017-10-25 SURGICAL SUPPLY — 38 items
ADH SKN CLS APL DERMABOND .7 (GAUZE/BANDAGES/DRESSINGS) ×1
BLADE CLIPPER SENSICLIP SURGIC (BLADE) ×3 IMPLANT
BLADE SURG 15 STRL LF DISP TIS (BLADE) ×1 IMPLANT
BLADE SURG 15 STRL SS (BLADE) ×3
CLIP TI WIDE RED SMALL 6 (CLIP) ×3 IMPLANT
COVER BACK TABLE 60X90IN (DRAPES) ×3 IMPLANT
COVER MAYO STAND STRL (DRAPES) ×3 IMPLANT
DERMABOND ADVANCED (GAUZE/BANDAGES/DRESSINGS) ×2
DERMABOND ADVANCED .7 DNX12 (GAUZE/BANDAGES/DRESSINGS) ×1 IMPLANT
DRAPE LAPAROTOMY 100X72 PEDS (DRAPES) ×3 IMPLANT
ELECT NDL TIP 2.8 STRL (NEEDLE) IMPLANT
ELECT NEEDLE TIP 2.8 STRL (NEEDLE) IMPLANT
ELECT REM PT RETURN 9FT ADLT (ELECTROSURGICAL) ×3
ELECTRODE REM PT RTRN 9FT ADLT (ELECTROSURGICAL) ×1 IMPLANT
GLOVE BIOGEL PI IND STRL 7.0 (GLOVE) ×1 IMPLANT
GLOVE BIOGEL PI INDICATOR 7.0 (GLOVE) ×2
GLOVE SURG SS PI 7.5 STRL IVOR (GLOVE) ×3 IMPLANT
GOWN STRL REUS W/TWL LRG LVL3 (GOWN DISPOSABLE) ×3 IMPLANT
GOWN STRL REUS W/TWL XL LVL3 (GOWN DISPOSABLE) ×6 IMPLANT
MARKER SKIN DUAL TIP RULER LAB (MISCELLANEOUS) ×1 IMPLANT
NDL HYPO 25X1 1.5 SAFETY (NEEDLE) ×1 IMPLANT
NEEDLE HYPO 25X1 1.5 SAFETY (NEEDLE) ×3 IMPLANT
NS IRRIG 500ML POUR BTL (IV SOLUTION) ×3 IMPLANT
PACK BASIN DAY SURGERY FS (CUSTOM PROCEDURE TRAY) ×3 IMPLANT
PENCIL BUTTON HOLSTER BLD 10FT (ELECTRODE) ×3 IMPLANT
SUCTION FRAZIER TIP 10 FR DISP (SUCTIONS) ×2 IMPLANT
SURGILUBE 2OZ TUBE FLIPTOP (MISCELLANEOUS) ×3 IMPLANT
SUT MNCRL AB 4-0 PS2 18 (SUTURE) ×3 IMPLANT
SUT SILK 4 0 TIES 17X18 (SUTURE) ×3 IMPLANT
SUT VIC AB 3-0 SH 27 (SUTURE) ×3
SUT VIC AB 3-0 SH 27X BRD (SUTURE) ×1 IMPLANT
SYR BULB IRRIGATION 50ML (SYRINGE) ×2 IMPLANT
SYR CONTROL 10ML LL (SYRINGE) ×3 IMPLANT
TOWEL OR 17X24 6PK STRL BLUE (TOWEL DISPOSABLE) ×6 IMPLANT
TRAY DSU PREP LF (CUSTOM PROCEDURE TRAY) ×3 IMPLANT
TUBE CONNECTING 12'X1/4 (SUCTIONS) ×1
TUBE CONNECTING 12X1/4 (SUCTIONS) ×2 IMPLANT
YANKAUER SUCT BULB TIP 10FT TU (MISCELLANEOUS) ×3 IMPLANT

## 2017-10-25 NOTE — Transfer of Care (Signed)
Immediate Anesthesia Transfer of Care Note  Patient: Erica [name]  Procedure(s) Performed: BIOPSY TEMPORAL ARTERY LEFT (Left )  Patient Location: PACU and Short Stay  Anesthesia Type:MAC  Level of Consciousness: awake, alert  and oriented  Airway & Oxygen Therapy: Patient Spontanous Breathing  Post-op Assessment: Report given to RN  Post vital signs: Reviewed and stable  Last Vitals: 117/76, 17, 100% Vitals Value Taken Time  BP    Temp    Pulse    Resp    SpO2      Last Pain:  Vitals:   10/25/17 0951  TempSrc:   PainSc: 7       Patients Stated Pain Goal: 6 (25/42/70 6237)  Complications: No apparent anesthesia complications

## 2017-10-25 NOTE — Op Note (Signed)
Preoperative diagnosis: headaches  Postoperative diagnosis: same   Procedure: excisional left temporal artery biopsy  Surgeon: Gurney Maxin, M.D.  Asst: none  Anesthesia: Monitored Local Anesthesia with Sedation  Indications for procedure: Erica [name] is a 37 y.o. year old female with symptoms of recurring headaches not improving with medications. Due to concern for temporal arteritis decision was made to proceed with excisional temporal artery biopsy.  Description of procedure: The patient was brought into the operative suite. Anesthesia was administered with Monitored Local Anesthesia with Sedation. WHO checklist was applied. The patient was then placed in supine position. The hair above the ear was clipped. The area was prepped and draped in the usual sterile fashion.  Next, doppler was used to identify the temporal artery. The area was infused with marcaine for anesthetic. A vertical incision was over the area. Once through the skin, blunt dissection was used to identify the artery. doppler was used to confirm the location. The artery was dissected proximally and distally for length. Next, 4-0 silk ties and clips were used to ligate the artery and the artery was divided at each end and sent to pathology. The area was irrigated and hemostasis was intact. doppler was then used to confirm removal of the artery and no other arterial structures in the area. The skin was then closed with 3-0 vicryl in the deep dermal level and 4-0 monocryl subcuticular running suture for the superficial layer. Dermabond was put in place for dressing. The patient was brought to pacu in stable condition.  Findings: intact temporal artery  Specimen: left temporal artery  Implant: none   Blood loss: <20ml  Local anesthesia: 10 ml marcaine   Complications: none  Gurney Maxin, M.D. General, Bariatric, & Minimally Invasive Surgery [facility]

## 2017-10-25 NOTE — Anesthesia Preprocedure Evaluation (Addendum)
Anesthesia Evaluation  Patient identified by MRN, date of birth, ID band Patient awake    Reviewed: Allergy & Precautions, NPO status , Patient's Chart, lab work & pertinent test results  History of Anesthesia Complications (+) PONV and history of anesthetic complications  Airway Mallampati: II  TM Distance: >3 FB Neck ROM: Full    Dental no notable dental hx.    Pulmonary former smoker,    Pulmonary exam normal breath sounds clear to auscultation       Cardiovascular negative cardio ROS Normal cardiovascular exam Rhythm:Regular Rate:Normal     Neuro/Psych  Headaches, PSYCHIATRIC DISORDERS Anxiety    GI/Hepatic negative GI ROS, Neg liver ROS,   Endo/Other  negative endocrine ROS  Renal/GU negative Renal ROS     Musculoskeletal negative musculoskeletal ROS (+)   Abdominal (+) + obese,   Peds  Hematology negative hematology ROS (+)   Anesthesia Other Findings HEADACHE  Reproductive/Obstetrics                            Anesthesia Physical Anesthesia Plan  ASA: II  Anesthesia Plan: MAC   Post-op Pain Management:    Induction: Intravenous  PONV Risk Score and Plan: 3 and Ondansetron, Dexamethasone, Propofol infusion, Midazolam and Treatment may vary due to age or medical condition  Airway Management Planned: Nasal Cannula  Additional Equipment:   Intra-op Plan:   Post-operative Plan:   Informed Consent: I have reviewed the patients History and Physical, chart, labs and discussed the procedure including the risks, benefits and alternatives for the proposed anesthesia with the patient or authorized representative who has indicated his/her understanding and acceptance.   Dental advisory given  Plan Discussed with: CRNA  Anesthesia Plan Comments:         Anesthesia Quick Evaluation
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
## 2017-10-25 NOTE — Discharge Instructions (Signed)
°  Post Anesthesia Home Care Instructions  Activity: Get plenty of rest for the remainder of the day. A responsible individual must stay with you for 24 hours following the procedure.  For the next 24 hours, DO NOT: -Drive a car -Operate machinery -Drink alcoholic beverages -Take any medication unless instructed by your physician -Make any legal decisions or sign important papers.  Meals: Start with liquid foods such as gelatin or soup. Progress to regular foods as tolerated. Avoid greasy, spicy, heavy foods. If nausea and/or vomiting occur, drink only clear liquids until the nausea and/or vomiting subsides. Call your physician if vomiting continues.  

## 2017-10-25 NOTE — Anesthesia Procedure Notes (Signed)
Procedure Name: MAC Performed by: Bonney Aid, CRNA Pre-anesthesia Checklist: Patient identified, Timeout performed, Emergency Drugs available, Suction available and Patient being monitored Oxygen Delivery Method: Nasal cannula

## 2017-10-26 ENCOUNTER — Encounter (HOSPITAL_BASED_OUTPATIENT_CLINIC_OR_DEPARTMENT_OTHER): Payer: Self-pay | Admitting: General Surgery

## 2017-10-26 NOTE — Anesthesia Postprocedure Evaluation (Signed)
Anesthesia Post Note  Patient: Erica [name]  Procedure(s) Performed: BIOPSY TEMPORAL ARTERY LEFT (Left )     Patient location during evaluation: PACU Anesthesia Type: MAC Level of consciousness: awake and alert Pain management: pain level controlled Vital Signs Assessment: post-procedure vital signs reviewed and stable Respiratory status: spontaneous breathing, nonlabored ventilation, respiratory function stable and patient connected to nasal cannula oxygen Cardiovascular status: stable and blood pressure returned to baseline Postop Assessment: no apparent nausea or vomiting Anesthetic complications: no    Last Vitals:  Vitals:   10/25/17 1106 10/25/17 1136  BP: 103/71 112/75  Pulse: 75 75  Resp: 18 18  Temp:  36.7 C  SpO2: 100% 98%    Last Pain:  Vitals:   10/25/17 1136  TempSrc:   PainSc: 0-No pain                 Ryan P Ellender

## 2017-11-02 ENCOUNTER — Telehealth: Payer: Self-pay | Admitting: Neurology

## 2017-11-02 NOTE — Telephone Encounter (Signed)
Please advise 

## 2017-11-02 NOTE — Telephone Encounter (Signed)
Patient called and left a voicemail stating that she had questions concerning the recent pathology report and wanted to know what her next steps were. Please call her at [phone]. Thanks!

## 2017-11-02 NOTE — Telephone Encounter (Signed)
I have called patient and let her know that her biopsy results is normal, no signs of inflammation or autoimmune disease of the blood vessels.   At this juncture, worrisome etiologies for headaches such as intracranial mass, pseudotumor, and temporal arteritis has been excluded.   She most likely has left temporalis muscle contusion.  Start flexeril 10mg  at bedtime for left temporal pain.  Continue topiramate 75mg  daily. She has since removed her contraceptive implant as this may also be contributing to headache and vision changes. I have asked her to get her eyes reassessed prior to her next visit with me on 10/29 at 3pm  All questions answered.  Erica K. Posey Pronto, DO

## 2017-11-20 ENCOUNTER — Other Ambulatory Visit: Payer: Self-pay | Admitting: Family Medicine

## 2017-11-20 DIAGNOSIS — F422 Mixed obsessional thoughts and acts: Secondary | ICD-10-CM

## 2017-11-20 DIAGNOSIS — F411 Generalized anxiety disorder: Secondary | ICD-10-CM

## 2017-11-22 NOTE — Telephone Encounter (Signed)
Last seen 06/11/17

## 2017-11-23 ENCOUNTER — Other Ambulatory Visit: Payer: Self-pay | Admitting: *Deleted

## 2017-11-23 DIAGNOSIS — F411 Generalized anxiety disorder: Secondary | ICD-10-CM

## 2017-11-23 DIAGNOSIS — F422 Mixed obsessional thoughts and acts: Secondary | ICD-10-CM

## 2017-11-23 MED ORDER — SERTRALINE HCL 100 MG PO TABS: 200.0000 mg | ORAL_TABLET | Freq: Every day | ORAL | 0 refills | Status: DC

## 2017-11-29 ENCOUNTER — Encounter: Payer: Self-pay | Admitting: Obstetrics & Gynecology

## 2017-11-29 ENCOUNTER — Ambulatory Visit: Payer: Medicaid Other | Admitting: Obstetrics & Gynecology

## 2017-11-29 VITALS — BP 99/70 | HR 85 | Ht 69.0 in | Wt 229.5 lb

## 2017-11-29 DIAGNOSIS — Z3009 Encounter for other general counseling and advice on contraception: Secondary | ICD-10-CM

## 2017-11-29 NOTE — Progress Notes (Signed)
Preoperative History and Physical  Erica [name] is a 37 y.o. [id number] with Patient's last menstrual period was 11/26/2017. admitted for a with temporal arteritis, off hormone based birth control wants definitive BCM with tubal ligation.    PMH:    Past Medical History:  Diagnosis Date  . Anemia    with pregnancy  . Anxiety   . Cervical radiculitis   . Dysrhythmia    Irregular heart beat  associated with abnormal tachycardia  . Endometriosis   . Finger fracture, left 10/2004  . Hemangioma 03/12/2017   3.5 cm benign hemangioma in the posterior right hepatic lobe  . History of appendicitis   . Irregular heart beat   . Migraines   . OCD (obsessive compulsive disorder)   . Ovarian cyst    left, hemorrhagic (1) right side (2)  . PONV (postoperative nausea and vomiting)   . Sciatica   . Umbilical hernia   . Wears glasses     PSH:     Past Surgical History:  Procedure Laterality Date  . APPENDECTOMY    . ARTERY BIOPSY Left 10/25/2017   Procedure: BIOPSY TEMPORAL ARTERY LEFT;  Surgeon: Kieth Brightly, Arta Bruce, MD;  Location: [place];  Service: General;  Laterality: Left;  . DIAGNOSTIC LAPAROSCOPY    . DILATION AND CURETTAGE OF UTERUS    . spinal tap     several  . TONSILLECTOMY AND ADENOIDECTOMY Bilateral 06/13/2016   Procedure: TONSILLECTOMY AND ADENOIDECTOMY;  Surgeon: Leta Baptist, MD;  Location: [place];  Service: ENT;  Laterality: Bilateral;  . WISDOM TOOTH EXTRACTION      POb/GynH:      OB History    Gravida  3   Para  2   Term  1   Preterm  1   AB  1   Living  2     SAB  1   TAB      Ectopic      Multiple      Live Births  2           SH:   Social History   Tobacco Use  . Smoking status: Former Smoker    Packs/day: 1.00    Years: 10.00    Pack years: 10.00    Types: Cigarettes    Last attempt to quit: 01/15/2013    Years since quitting: 4.8  . Smokeless tobacco: Never Used  . Tobacco comment: Quit smoking  almost 2 years ago  Substance Use Topics  . Alcohol use: No    Alcohol/week: 0.0 standard drinks  . Drug use: No    FH:    Family History  Problem Relation Age of Onset  . Cancer Maternal Grandmother 21       ovarian, and uterine  . Cancer Mother 30       ovarian and Breast  . Bipolar disorder Mother   . Bipolar disorder Sister   . Depression Brother   . Healthy Son        x 2     Allergies:  Allergies  Allergen Reactions  . Amoxicillin Anaphylaxis  . Effexor [Venlafaxine] Hives  . Other Hives    Poppy Seeds    Medications:       Current Outpatient Medications:  .  cyclobenzaprine (FLEXERIL) 10 MG tablet, Take 1 tablet (10 mg total) by mouth at bedtime as needed for muscle spasms. (Patient taking differently: Take 10 mg by mouth at bedtime. ), Disp: 30  tablet, Rfl: 5 .  ketorolac (TORADOL) 30 MG/ML injection, Inject 30 mg into the muscle as needed. , Disp: , Rfl:  .  MAGNESIUM OXIDE PO, Take 500 mg by mouth daily. , Disp: , Rfl:  .  Multiple Vitamin (MULTIVITAMIN) tablet, Take 1 tablet by mouth daily., Disp: , Rfl:  .  rizatriptan (MAXALT) 10 MG tablet, TAKE ONE TABLET BY MOUTH AS NEEDED FOR MIGRAINE. MAY REPEAT IN 2 HOURS IF NEEDED., Disp: 10 tablet, Rfl: 11 .  sertraline (ZOLOFT) 100 MG tablet, Take 2 tablets (200 mg total) by mouth daily. (Needs to be seen before next refill), Disp: 60 tablet, Rfl: 0 .  topiramate (TOPAMAX) 50 MG tablet, Take 1.5 tablets daily, Disp: 45 tablet, Rfl: 11  Review of Systems:   Review of Systems  Constitutional: Negative for fever, chills, weight loss, malaise/fatigue and diaphoresis.  HENT: Negative for hearing loss, ear pain, nosebleeds, congestion, sore throat, neck pain, tinnitus and ear discharge.   Eyes: Negative for blurred vision, double vision, photophobia, pain, discharge and redness.  Respiratory: Negative for cough, hemoptysis, sputum production, shortness of breath, wheezing and stridor.   Cardiovascular: Negative for chest  pain, palpitations, orthopnea, claudication, leg swelling and PND.  Gastrointestinal: Positive for abdominal pain. Negative for heartburn, nausea, vomiting, diarrhea, constipation, blood in stool and melena.  Genitourinary: Negative for dysuria, urgency, frequency, hematuria and flank pain.  Musculoskeletal: Negative for myalgias, back pain, joint pain and falls.  Skin: Negative for itching and rash.  Neurological: Negative for dizziness, tingling, tremors, sensory change, speech change, focal weakness, seizures, loss of consciousness, weakness and headaches.  Endo/Heme/Allergies: Negative for environmental allergies and polydipsia. Does not bruise/bleed easily.  Psychiatric/Behavioral: Negative for depression, suicidal ideas, hallucinations, memory loss and substance abuse. The patient is not nervous/anxious and does not have insomnia.      PHYSICAL EXAM:  Blood pressure 99/70, pulse 85, height 5\' 9"  (1.753 m), weight 229 lb 8 oz (104.1 kg), last menstrual period 11/26/2017.    Vitals reviewed. Constitutional: She is oriented to person, place, and time. She appears well-developed and well-nourished.  HENT:  Head: Normocephalic and atraumatic.  Right Ear: External ear normal.  Left Ear: External ear normal.  Nose: Nose normal.  Mouth/Throat: Oropharynx is clear and moist.  Eyes: Conjunctivae and EOM are normal. Pupils are equal, round, and reactive to light. Right eye exhibits no discharge. Left eye exhibits no discharge. No scleral icterus.  Neck: Normal range of motion. Neck supple. No tracheal deviation present. No thyromegaly present.  Cardiovascular: Normal rate, regular rhythm, normal heart sounds and intact distal pulses.  Exam reveals no gallop and no friction rub.   No murmur heard. Respiratory: Effort normal and breath sounds normal. No respiratory distress. She has no wheezes. She has no rales. She exhibits no tenderness.  GI: Soft. Bowel sounds are normal. She exhibits no  distension and no mass. There is tenderness. There is no rebound and no guarding.  Genitourinary:       Vulva is normal without lesions Vagina is pink moist without discharge Cervix normal in appearance and pap is normal Uterus is normal size, contour, position, consistency, mobility, non-tender Adnexa is negative with normal sized ovaries by sonogram  Musculoskeletal: Normal range of motion. She exhibits no edema and no tenderness.  Neurological: She is alert and oriented to person, place, and time. She has normal reflexes. She displays normal reflexes. No cranial nerve deficit. She exhibits normal muscle tone. Coordination normal.  Skin: Skin is warm and  dry. No rash noted. No erythema. No pallor.  Psychiatric: She has a normal mood and affect. Her behavior is normal. Judgment and thought content normal.    Labs: No results found for this or any previous visit (from the past 336 hour(s)).  EKG: Orders placed or performed during the hospital encounter of 06/13/16  . EKG 12 lead  . EKG 12 lead    Imaging Studies: No results found.    Assessment: multiparous female desires permanent sterilization   Patient Active Problem List   Diagnosis Date Noted  . Encounter for Nexplanon removal 10/17/2017  . Migraine without aura and without status migrainosus, not intractable 10/12/2016  . Nexplanon insertion 03/23/2016  . Cervical disc disorder with radiculopathy of cervical region 09/24/2014  . GAD (generalized anxiety disorder) 05/02/2013  . OCD (obsessive compulsive disorder) 05/02/2013  . Irregular heart beat     Plan: Laparoscopic bilateral tubal ligation using electrocautery  Florian Buff 11/29/2017 3:33 PM     Face to face time:  15 minutes  Greater than 50% of the visit time was spent in counseling and coordination of care with the patient.  The summary and outline of the counseling and care coordination is summarized in the note above.   All questions were  answered.

## 2017-12-05 ENCOUNTER — Other Ambulatory Visit: Payer: Self-pay | Admitting: Obstetrics & Gynecology

## 2017-12-05 NOTE — Patient Instructions (Signed)
Erica [name]  12/05/2017     @PREFPERIOPPHARMACY @   Your procedure is scheduled on  12/12/2017 .  Report to [facility] at  1150   A.M.  Call this number if you have problems the morning of surgery:  [phone]   Remember:  Do not eat or drink after midnight.  You may drink clear liquids until  12 midnight 12/11/2017 .  Clear liquids allowed are:                    Water, Juice (non-citric and without pulp), Carbonated beverages, Clear Tea, Black Coffee only, Plain Jell-O only, Gatorade and Plain Popsicles only    Take these medicines the morning of surgery with A SIP OF WATER maxalt.    Do not wear jewelry, make-up or nail polish.  Do not wear lotions, powders, or perfumes, or deodorant.  Do not shave 48 hours prior to surgery.  Men may shave face and neck.  Do not bring valuables to the hospital.  [facility] is not responsible for any belongings or valuables.  Contacts, dentures or bridgework may not be worn into surgery.  Leave your suitcase in the car.  After surgery it may be brought to your room.  For patients admitted to the hospital, discharge time will be determined by your treatment team.  Patients discharged the day of surgery will not be allowed to drive home.   Name and phone number of your driver:   family Special instructions:  None  Please read over the following fact sheets that you were given. Anesthesia Post-op Instructions and Care and Recovery After Surgery       Laparoscopic Tubal Ligation Laparoscopic tubal ligation is a procedure to close the fallopian tubes. This is done so that you cannot get pregnant. When the fallopian tubes are closed, the eggs that your ovaries release cannot enter the uterus, and sperm cannot reach the released eggs. A laparoscopic tubal ligation is sometimes called "getting your tubes tied." You should not have this procedure if you want to get pregnant someday or if you are unsure about having more  children. Tell a health care provider about:  Any allergies you have.  All medicines you are taking, including vitamins, herbs, eye drops, creams, and over-the-counter medicines.  Any problems you or family members have had with anesthetic medicines.  Any blood disorders you have.  Any surgeries you have had.  Any medical conditions you have.  Whether you are pregnant or may be pregnant.  Any past pregnancies. What are the risks? Generally, this is a safe procedure. However, problems may occur, including:  Infection.  Bleeding.  Injury to surrounding organs.  Side effects from anesthetics.  Failure of the procedure.  This procedure can increase your risk of a kind of pregnancy in which a fertilized egg attaches to the outside of the uterus (ectopic pregnancy). What happens before the procedure?  Ask your health care provider about: ? Changing or stopping your regular medicines. This is especially important if you are taking diabetes medicines or blood thinners. ? Taking medicines such as aspirin and ibuprofen. These medicines can thin your blood. Do not take these medicines before your procedure if your health care provider instructs you not to.  Follow instructions from your health care provider about eating and drinking restrictions.  Plan to have someone take you home after the procedure.  If you go home right after the  procedure, plan to have someone with you for 24 hours. What happens during the procedure?  You will be given one or more of the following: ? A medicine to help you relax (sedative). ? A medicine to numb the area (local anesthetic). ? A medicine to make you fall asleep (general anesthetic). ? A medicine that is injected into an area of your body to numb everything below the injection site (regional anesthetic).  An IV tube will be inserted into one of your veins. It will be used to give you medicines and fluids during the procedure.  Your bladder  may be emptied with a small tube (catheter).  If you have been given a general anesthetic, a tube will be put down your throat to help you breathe.  Two small cuts (incisions) will be made in your lower abdomen and near your belly button.  Your abdomen will be inflated with a gas. This will let the surgeon see better and will give the surgeon room to work.  A thin, lighted tube (laparoscope) with a camera attached will be inserted into your abdomen through one of the incisions. Small instruments will be inserted through the other incision.  The fallopian tubes will be tied off, burned (cauterized), or blocked with a clip, ring, or clamp. A small portion in the center of each fallopian tube may be removed.  The gas will be released from the abdomen.  The incisions will be closed with stitches (sutures).  A bandage (dressing) will be placed over the incisions. The procedure may vary among health care providers and hospitals. What happens after the procedure?  Your blood pressure, heart rate, breathing rate, and blood oxygen level will be monitored often until the medicines you were given have worn off.  You will be given medicine to help with pain, nausea, and vomiting as needed. This information is not intended to replace advice given to you by your health care provider. Make sure you discuss any questions you have with your health care provider. Document Released: 05/22/2000 Document Revised: 07/22/2015 Document Reviewed: 01/24/2015 Elsevier Interactive Patient Education  2018 [employer].  Laparoscopic Tubal Ligation, Care After Refer to this sheet in the next few weeks. These instructions provide you with information about caring for yourself after your procedure. Your health care provider may also give you more specific instructions. Your treatment has been planned according to current medical practices, but problems sometimes occur. Call your health care provider if you have any  problems or questions after your procedure. What can I expect after the procedure? After the procedure, it is common to have:  A sore throat.  Discomfort in your shoulder.  Mild discomfort or cramping in your abdomen.  Gas pains.  Pain or soreness in the area where the surgical cut (incision) was made.  A bloated feeling.  Tiredness.  Nausea.  Vomiting.  Follow these instructions at home: Medicines  Take over-the-counter and prescription medicines only as told by your health care provider.  Do not take aspirin because it can cause bleeding.  Do not drive or operate heavy machinery while taking prescription pain medicine. Activity  Rest for the rest of the day.  Return to your normal activities as told by your health care provider. Ask your health care provider what activities are safe for you. Incision care   Follow instructions from your health care provider about how to take care of your incision. Make sure you: ? Wash your hands with soap and water before you  change your bandage (dressing). If soap and water are not available, use hand sanitizer. ? Change your dressing as told by your health care provider. ? Leave stitches (sutures) in place. They may need to stay in place for 2 weeks or longer.  Check your incision area every day for signs of infection. Check for: ? More redness, swelling, or pain. ? More fluid or blood. ? Warmth. ? Pus or a bad smell. Other Instructions  Do not take baths, swim, or use a hot tub until your health care provider approves. You may take showers.  Keep all follow-up visits as told by your health care provider. This is important.  Have someone help you with your daily household tasks for the first few days. Contact a health care provider if:  You have more redness, swelling, or pain around your incision.  Your incision feels warm to the touch.  You have pus or a bad smell coming from your incision.  The edges of your  incision break open after the sutures have been removed.  Your pain does not improve after 2-3 days.  You have a rash.  You repeatedly become dizzy or light-headed.  Your pain medicine is not helping.  You are constipated. Get help right away if:  You have a fever.  You faint.  You have increasing pain in your abdomen.  You have severe pain in one or both of your shoulders.  You have fluid or blood coming from your sutures or from your vagina.  You have shortness of breath or difficulty breathing.  You have chest pain or leg pain.  You have ongoing nausea, vomiting, or diarrhea. This information is not intended to replace advice given to you by your health care provider. Make sure you discuss any questions you have with your health care provider. Document Released: 09/02/2004 Document Revised: 07/19/2015 Document Reviewed: 01/24/2015 Elsevier Interactive Patient Education  2018 [place] Anesthesia, Adult General anesthesia is the use of medicines to make a person "go to sleep" (be unconscious) for a medical procedure. General anesthesia is often recommended when a procedure:  Is long.  Requires you to be still or in an unusual position.  Is major and can cause you to lose blood.  Is impossible to do without general anesthesia.  The medicines used for general anesthesia are called general anesthetics. In addition to making you sleep, the medicines:  Prevent pain.  Control your blood pressure.  Relax your muscles.  Tell a health care provider about:  Any allergies you have.  All medicines you are taking, including vitamins, herbs, eye drops, creams, and over-the-counter medicines.  Any problems you or family members have had with anesthetic medicines.  Types of anesthetics you have had in the past.  Any bleeding disorders you have.  Any surgeries you have had.  Any medical conditions you have.  Any history of heart or lung conditions,  such as heart failure, sleep apnea, or chronic obstructive pulmonary disease (COPD).  Whether you are pregnant or may be pregnant.  Whether you use tobacco, alcohol, marijuana, or street drugs.  Any history of Armed forces logistics/support/administrative officer.  Any history of depression or anxiety. What are the risks? Generally, this is a safe procedure. However, problems may occur, including:  Allergic reaction to anesthetics.  Lung and heart problems.  Inhaling food or liquids from your stomach into your lungs (aspiration).  Injury to nerves.  Waking up during your procedure and being unable to move (rare).  Extreme  agitation or a state of mental confusion (delirium) when you wake up from the anesthetic.  Air in the bloodstream, which can lead to stroke.  These problems are more likely to develop if you are having a major surgery or if you have an advanced medical condition. You can prevent some of these complications by answering all of your health care provider's questions thoroughly and by following all pre-procedure instructions. General anesthesia can cause side effects, including:  Nausea or vomiting  A sore throat from the breathing tube.  Feeling cold or shivery.  Feeling tired, washed out, or achy.  Sleepiness or drowsiness.  Confusion or agitation.  What happens before the procedure? Staying hydrated Follow instructions from your health care provider about hydration, which may include:  Up to 2 hours before the procedure - you may continue to drink clear liquids, such as water, clear fruit juice, black coffee, and plain tea.  Eating and drinking restrictions Follow instructions from your health care provider about eating and drinking, which may include:  8 hours before the procedure - stop eating heavy meals or foods such as meat, fried foods, or fatty foods.  6 hours before the procedure - stop eating light meals or foods, such as toast or cereal.  6 hours before the procedure -  stop drinking milk or drinks that contain milk.  2 hours before the procedure - stop drinking clear liquids.  Medicines  Ask your health care provider about: ? Changing or stopping your regular medicines. This is especially important if you are taking diabetes medicines or blood thinners. ? Taking medicines such as aspirin and ibuprofen. These medicines can thin your blood. Do not take these medicines before your procedure if your health care provider instructs you not to. ? Taking new dietary supplements or medicines. Do not take these during the week before your procedure unless your health care provider approves them.  If you are told to take a medicine or to continue taking a medicine on the day of the procedure, take the medicine with sips of water. General instructions   Ask if you will be going home the same day, the following day, or after a longer hospital stay. ? Plan to have someone take you home. ? Plan to have someone stay with you for the first 24 hours after you leave the hospital or clinic.  For 3-6 weeks before the procedure, try not to use any tobacco products, such as cigarettes, chewing tobacco, and e-cigarettes.  You may brush your teeth on the morning of the procedure, but make sure to spit out the toothpaste. What happens during the procedure?  You will be given anesthetics through a mask and through an IV tube in one of your veins.  You may receive medicine to help you relax (sedative).  As soon as you are asleep, a breathing tube may be used to help you breathe.  An anesthesia specialist will stay with you throughout the procedure. He or she will help keep you comfortable and safe by continuing to give you medicines and adjusting the amount of medicine that you get. He or she will also watch your blood pressure, pulse, and oxygen levels to make sure that the anesthetics do not cause any problems.  If a breathing tube was used to help you breathe, it will be  removed before you wake up. The procedure may vary among health care providers and hospitals. What happens after the procedure?  You will wake up, often slowly, after  the procedure is complete, usually in a recovery area.  Your blood pressure, heart rate, breathing rate, and blood oxygen level will be monitored until the medicines you were given have worn off.  You may be given medicine to help you calm down if you feel anxious or agitated.  If you will be going home the same day, your health care provider may check to make sure you can stand, drink, and urinate.  Your health care providers will treat your pain and side effects before you go home.  Do not drive for 24 hours if you received a sedative.  You may: ? Feel nauseous and vomit. ? Have a sore throat. ? Have mental slowness. ? Feel cold or shivery. ? Feel sleepy. ? Feel tired. ? Feel sore or achy, even in parts of your body where you did not have surgery. This information is not intended to replace advice given to you by your health care provider. Make sure you discuss any questions you have with your health care provider. Document Released: 05/23/2007 Document Revised: 07/27/2015 Document Reviewed: 01/28/2015 Elsevier Interactive Patient Education  2018 [place] Anesthesia, Adult, Care After These instructions provide you with information about caring for yourself after your procedure. Your health care provider may also give you more specific instructions. Your treatment has been planned according to current medical practices, but problems sometimes occur. Call your health care provider if you have any problems or questions after your procedure. What can I expect after the procedure? After the procedure, it is common to have:  Vomiting.  A sore throat.  Mental slowness.  It is common to feel:  Nauseous.  Cold or shivery.  Sleepy.  Tired.  Sore or achy, even in parts of your body where you did not  have surgery.  Follow these instructions at home: For at least 24 hours after the procedure:  Do not: ? Participate in activities where you could fall or become injured. ? Drive. ? Use heavy machinery. ? Drink alcohol. ? Take sleeping pills or medicines that cause drowsiness. ? Make important decisions or sign legal documents. ? Take care of children on your own.  Rest. Eating and drinking  If you vomit, drink water, juice, or soup when you can drink without vomiting.  Drink enough fluid to keep your urine clear or pale yellow.  Make sure you have little or no nausea before eating solid foods.  Follow the diet recommended by your health care provider. General instructions  Have a responsible adult stay with you until you are awake and alert.  Return to your normal activities as told by your health care provider. Ask your health care provider what activities are safe for you.  Take over-the-counter and prescription medicines only as told by your health care provider.  If you smoke, do not smoke without supervision.  Keep all follow-up visits as told by your health care provider. This is important. Contact a health care provider if:  You continue to have nausea or vomiting at home, and medicines are not helpful.  You cannot drink fluids or start eating again.  You cannot urinate after 8-12 hours.  You develop a skin rash.  You have fever.  You have increasing redness at the site of your procedure. Get help right away if:  You have difficulty breathing.  You have chest pain.  You have unexpected bleeding.  You feel that you are having a life-threatening or urgent problem. This information is not intended to  replace advice given to you by your health care provider. Make sure you discuss any questions you have with your health care provider. Document Released: 05/22/2000 Document Revised: 07/19/2015 Document Reviewed: 01/28/2015 Elsevier Interactive Patient  Education  [employer].

## 2017-12-07 ENCOUNTER — Telehealth: Payer: Self-pay | Admitting: Obstetrics & Gynecology

## 2017-12-07 NOTE — Telephone Encounter (Signed)
Patient called, stated that the hospital called her for her pre-op for her surgery for 12/12/17.  She is upset because she doesn't know the time of her surgery and needs to know so that she can make arrangements for her children.  [phone]

## 2017-12-10 ENCOUNTER — Other Ambulatory Visit: Payer: Self-pay

## 2017-12-10 ENCOUNTER — Encounter (HOSPITAL_COMMUNITY): Payer: Self-pay

## 2017-12-10 ENCOUNTER — Encounter (HOSPITAL_COMMUNITY)
Admission: RE | Admit: 2017-12-10 | Discharge: 2017-12-10 | Disposition: A | Discharge status: Home / Self Care | Payer: Medicaid Other | Source: Ambulatory Visit | Attending: Obstetrics & Gynecology | Admitting: Obstetrics & Gynecology

## 2017-12-10 DIAGNOSIS — Z3009 Encounter for other general counseling and advice on contraception: Secondary | ICD-10-CM | POA: Insufficient documentation

## 2017-12-10 DIAGNOSIS — R9431 Abnormal electrocardiogram [ECG] [EKG]: Secondary | ICD-10-CM | POA: Insufficient documentation

## 2017-12-10 LAB — COMPREHENSIVE METABOLIC PANEL
ALK PHOS: 52 U/L (ref 38–126)
ALT: 17 U/L (ref 0–44)
AST: 16 U/L (ref 15–41)
Albumin: 4 g/dL (ref 3.5–5.0)
Anion gap: 7 (ref 5–15)
BILIRUBIN TOTAL: 0.5 mg/dL (ref 0.3–1.2)
BUN: 10 mg/dL (ref 6–20)
CALCIUM: 9 mg/dL (ref 8.9–10.3)
CO2: 23 mmol/L (ref 22–32)
Chloride: 109 mmol/L (ref 98–111)
Creatinine, Ser: 0.82 mg/dL (ref 0.44–1.00)
Glucose, Bld: 102 mg/dL — ABNORMAL HIGH (ref 70–99)
Potassium: 3.5 mmol/L (ref 3.5–5.1)
SODIUM: 139 mmol/L (ref 135–145)
TOTAL PROTEIN: 7.2 g/dL (ref 6.5–8.1)

## 2017-12-10 LAB — RAPID HIV SCREEN (HIV 1/2 AB+AG)
HIV 1/2 Antibodies: NONREACTIVE
HIV-1 P24 ANTIGEN - HIV24: NONREACTIVE

## 2017-12-10 LAB — CBC
HCT: 41.6 % (ref 36.0–46.0)
HEMOGLOBIN: 13.3 g/dL (ref 12.0–15.0)
MCH: 28.7 pg (ref 26.0–34.0)
MCHC: 32 g/dL (ref 30.0–36.0)
MCV: 89.8 fL (ref 80.0–100.0)
Platelets: 264 10*3/uL (ref 150–400)
RBC: 4.63 MIL/uL (ref 3.87–5.11)
RDW: 13 % (ref 11.5–15.5)
WBC: 6 10*3/uL (ref 4.0–10.5)
nRBC: 0 % (ref 0.0–0.2)

## 2017-12-10 LAB — URINALYSIS, ROUTINE W REFLEX MICROSCOPIC
BILIRUBIN URINE: NEGATIVE
Glucose, UA: NEGATIVE mg/dL
HGB URINE DIPSTICK: NEGATIVE
Ketones, ur: NEGATIVE mg/dL
Leukocytes, UA: NEGATIVE
Nitrite: NEGATIVE
Protein, ur: NEGATIVE mg/dL
SPECIFIC GRAVITY, URINE: 1.016 (ref 1.005–1.030)
pH: 7 (ref 5.0–8.0)

## 2017-12-10 LAB — HCG, QUANTITATIVE, PREGNANCY

## 2017-12-12 ENCOUNTER — Ambulatory Visit (HOSPITAL_COMMUNITY): Payer: Medicaid Other | Admitting: Anesthesiology

## 2017-12-12 ENCOUNTER — Encounter (HOSPITAL_COMMUNITY): Payer: Self-pay | Admitting: Anesthesiology

## 2017-12-12 ENCOUNTER — Encounter (HOSPITAL_COMMUNITY)
Admission: RE | Disposition: A | Discharge status: Home / Self Care | Payer: Self-pay | Source: Ambulatory Visit | Attending: Obstetrics & Gynecology

## 2017-12-12 ENCOUNTER — Ambulatory Visit (HOSPITAL_COMMUNITY)
Admission: RE | Admit: 2017-12-12 | Discharge: 2017-12-12 | Disposition: A | Discharge status: Home / Self Care | Payer: Medicaid Other | Source: Ambulatory Visit | Attending: Obstetrics & Gynecology | Admitting: Obstetrics & Gynecology

## 2017-12-12 DIAGNOSIS — Z302 Encounter for sterilization: Secondary | ICD-10-CM | POA: Diagnosis not present

## 2017-12-12 DIAGNOSIS — F429 Obsessive-compulsive disorder, unspecified: Secondary | ICD-10-CM | POA: Insufficient documentation

## 2017-12-12 DIAGNOSIS — G43909 Migraine, unspecified, not intractable, without status migrainosus: Secondary | ICD-10-CM | POA: Insufficient documentation

## 2017-12-12 DIAGNOSIS — Z79899 Other long term (current) drug therapy: Secondary | ICD-10-CM | POA: Diagnosis not present

## 2017-12-12 DIAGNOSIS — Z87891 Personal history of nicotine dependence: Secondary | ICD-10-CM | POA: Insufficient documentation

## 2017-12-12 DIAGNOSIS — F411 Generalized anxiety disorder: Secondary | ICD-10-CM | POA: Diagnosis not present

## 2017-12-12 HISTORY — PX: LAPAROSCOPIC TUBAL LIGATION: SHX1937

## 2017-12-12 SURGERY — LIGATION, FALLOPIAN TUBE, LAPAROSCOPIC
Anesthesia: General | Laterality: Bilateral

## 2017-12-12 MED ORDER — ROCURONIUM BROMIDE 100 MG/10ML IV SOLN
INTRAVENOUS | Status: DC | PRN
  Administered 2017-12-12: 40 mg via INTRAVENOUS

## 2017-12-12 MED ORDER — MEPERIDINE HCL 50 MG/ML IJ SOLN: 6.2500 mg | INTRAMUSCULAR | Status: DC | PRN

## 2017-12-12 MED ORDER — KETOROLAC TROMETHAMINE 10 MG PO TABS: 10.0000 mg | ORAL_TABLET | Freq: Three times a day (TID) | ORAL | 0 refills | Status: DC | PRN

## 2017-12-12 MED ORDER — SUGAMMADEX SODIUM 500 MG/5ML IV SOLN
INTRAVENOUS | Status: DC | PRN
  Administered 2017-12-12: 207.8 mg via INTRAVENOUS

## 2017-12-12 MED ORDER — ONDANSETRON 8 MG PO TBDP: 8.0000 mg | ORAL_TABLET | Freq: Three times a day (TID) | ORAL | 0 refills | Status: DC | PRN

## 2017-12-12 MED ORDER — HYDROCODONE-ACETAMINOPHEN 5-325 MG PO TABS: 1.0000 | ORAL_TABLET | Freq: Four times a day (QID) | ORAL | 0 refills | Status: DC | PRN

## 2017-12-12 MED ORDER — ONDANSETRON HCL 4 MG/2ML IJ SOLN
INTRAMUSCULAR | Status: AC
  Filled 2017-12-12: qty 2

## 2017-12-12 MED ORDER — DEXAMETHASONE SODIUM PHOSPHATE 4 MG/ML IJ SOLN
INTRAMUSCULAR | Status: DC | PRN
  Administered 2017-12-12: 4 mg via INTRAVENOUS

## 2017-12-12 MED ORDER — DEXAMETHASONE SODIUM PHOSPHATE 4 MG/ML IJ SOLN
INTRAMUSCULAR | Status: AC
  Filled 2017-12-12: qty 1

## 2017-12-12 MED ORDER — HYDROCODONE-ACETAMINOPHEN 7.5-325 MG PO TABS
1.0000 | ORAL_TABLET | Freq: Once | ORAL | Status: AC | PRN
  Administered 2017-12-12: 1 via ORAL
  Filled 2017-12-12: qty 1

## 2017-12-12 MED ORDER — PROPOFOL 10 MG/ML IV BOLUS
INTRAVENOUS | Status: DC | PRN
  Administered 2017-12-12: 180 mg via INTRAVENOUS

## 2017-12-12 MED ORDER — GENTAMICIN IN SALINE 1.6-0.9 MG/ML-% IV SOLN
INTRAVENOUS | Status: AC
  Filled 2017-12-12: qty 50

## 2017-12-12 MED ORDER — EPHEDRINE SULFATE 50 MG/ML IJ SOLN
INTRAMUSCULAR | Status: DC | PRN
  Administered 2017-12-12: 10 mg via INTRAVENOUS

## 2017-12-12 MED ORDER — FENTANYL CITRATE (PF) 100 MCG/2ML IJ SOLN
INTRAMUSCULAR | Status: DC | PRN
  Administered 2017-12-12: 150 ug via INTRAVENOUS
  Administered 2017-12-12 (×2): 50 ug via INTRAVENOUS

## 2017-12-12 MED ORDER — GENTAMICIN SULFATE 40 MG/ML IJ SOLN
INTRAVENOUS | Status: AC
  Administered 2017-12-12: 410 mg via INTRAVENOUS
  Filled 2017-12-12: qty 10.25

## 2017-12-12 MED ORDER — MIDAZOLAM HCL 2 MG/2ML IJ SOLN
INTRAMUSCULAR | Status: DC | PRN
  Administered 2017-12-12: 2 mg via INTRAVENOUS

## 2017-12-12 MED ORDER — KETOROLAC TROMETHAMINE 30 MG/ML IJ SOLN: 30.0000 mg | Freq: Once | INTRAMUSCULAR | Status: DC | PRN

## 2017-12-12 MED ORDER — ONDANSETRON HCL 4 MG/2ML IJ SOLN
INTRAMUSCULAR | Status: DC | PRN
  Administered 2017-12-12: 4 mg via INTRAVENOUS

## 2017-12-12 MED ORDER — GLYCOPYRROLATE 0.2 MG/ML IJ SOLN
INTRAMUSCULAR | Status: AC
  Filled 2017-12-12: qty 1

## 2017-12-12 MED ORDER — GLYCOPYRROLATE 0.2 MG/ML IJ SOLN
INTRAMUSCULAR | Status: DC | PRN
  Administered 2017-12-12: 0.2 mg via INTRAVENOUS

## 2017-12-12 MED ORDER — SUGAMMADEX SODIUM 200 MG/2ML IV SOLN
INTRAVENOUS | Status: AC
  Filled 2017-12-12: qty 4

## 2017-12-12 MED ORDER — HYDROMORPHONE HCL 1 MG/ML IJ SOLN
0.2500 mg | INTRAMUSCULAR | Status: DC | PRN
  Administered 2017-12-12 (×2): 0.5 mg via INTRAVENOUS
  Filled 2017-12-12 (×2): qty 0.5

## 2017-12-12 MED ORDER — LACTATED RINGERS IV SOLN
INTRAVENOUS | Status: DC
  Administered 2017-12-12: 12:00:00 via INTRAVENOUS

## 2017-12-12 MED ORDER — SODIUM CHLORIDE 0.9 % IR SOLN
Status: DC | PRN
  Administered 2017-12-12: 1000 mL

## 2017-12-12 MED ORDER — FENTANYL CITRATE (PF) 250 MCG/5ML IJ SOLN
INTRAMUSCULAR | Status: AC
  Filled 2017-12-12: qty 5

## 2017-12-12 MED ORDER — CLINDAMYCIN PHOSPHATE 900 MG/50ML IV SOLN
INTRAVENOUS | Status: AC
  Filled 2017-12-12: qty 50

## 2017-12-12 MED ORDER — KETOROLAC TROMETHAMINE 30 MG/ML IJ SOLN
30.0000 mg | Freq: Once | INTRAMUSCULAR | Status: AC
  Administered 2017-12-12: 30 mg via INTRAVENOUS
  Filled 2017-12-12: qty 1

## 2017-12-12 MED ORDER — ONDANSETRON HCL 4 MG/2ML IJ SOLN: 4.0000 mg | Freq: Once | INTRAMUSCULAR | Status: DC | PRN

## 2017-12-12 MED ORDER — MIDAZOLAM HCL 2 MG/2ML IJ SOLN
INTRAMUSCULAR | Status: AC
  Filled 2017-12-12: qty 2

## 2017-12-12 SURGICAL SUPPLY — 32 items
BLADE SURG SZ11 CARB STEEL (BLADE) ×3 IMPLANT
CLOTH BEACON ORANGE TIMEOUT ST (SAFETY) ×3 IMPLANT
COVER LIGHT HANDLE STERIS (MISCELLANEOUS) ×6 IMPLANT
ELECT REM PT RETURN 9FT ADLT (ELECTROSURGICAL) ×3
ELECTRODE REM PT RTRN 9FT ADLT (ELECTROSURGICAL) ×1 IMPLANT
GAUZE SPONGE 4X4 16PLY XRAY LF (GAUZE/BANDAGES/DRESSINGS) ×3 IMPLANT
GLOVE BIOGEL PI IND STRL 7.0 (GLOVE) ×2 IMPLANT
GLOVE BIOGEL PI IND STRL 8 (GLOVE) ×1 IMPLANT
GLOVE BIOGEL PI INDICATOR 7.0 (GLOVE) ×6
GLOVE BIOGEL PI INDICATOR 8 (GLOVE) ×2
GLOVE ECLIPSE 8.0 STRL XLNG CF (GLOVE) ×3 IMPLANT
GOWN STRL REUS W/TWL LRG LVL3 (GOWN DISPOSABLE) ×3 IMPLANT
GOWN STRL REUS W/TWL XL LVL3 (GOWN DISPOSABLE) ×3 IMPLANT
INST SET LAPROSCOPIC GYN AP (KITS) ×3 IMPLANT
KIT TURNOVER CYSTO (KITS) ×3 IMPLANT
MANIFOLD NEPTUNE II (INSTRUMENTS) ×3 IMPLANT
NDL INSUFFLATION 14GA 120MM (NEEDLE) ×1 IMPLANT
NEEDLE INSUFFLATION 14GA 120MM (NEEDLE) ×3 IMPLANT
PACK PERI GYN (CUSTOM PROCEDURE TRAY) ×3 IMPLANT
PAD ARMBOARD 7.5X6 YLW CONV (MISCELLANEOUS) ×3 IMPLANT
SET BASIN LINEN APH (SET/KITS/TRAYS/PACK) ×3 IMPLANT
SHEARS HARMONIC HDI 36CM (ELECTROSURGICAL) ×2 IMPLANT
SOLUTION ANTI FOG 6CC (MISCELLANEOUS) ×3 IMPLANT
SPONGE GAUZE 2X2 8PLY STER LF (GAUZE/BANDAGES/DRESSINGS) ×2
SPONGE GAUZE 2X2 8PLY STRL LF (GAUZE/BANDAGES/DRESSINGS) ×3 IMPLANT
STAPLER VISISTAT 35W (STAPLE) ×3 IMPLANT
SUT VICRYL 0 UR6 27IN ABS (SUTURE) ×3 IMPLANT
SYR 10ML LL (SYRINGE) ×3 IMPLANT
TROCAR ENDO BLADELESS 11MM (ENDOMECHANICALS) ×3 IMPLANT
TROCAR XCEL NON-BLD 5MMX100MML (ENDOMECHANICALS) ×2 IMPLANT
TUBING INSUF HEATED (TUBING) ×3 IMPLANT
WARMER LAPAROSCOPE (MISCELLANEOUS) ×3 IMPLANT

## 2017-12-12 NOTE — Anesthesia Procedure Notes (Signed)
Procedure Name: Intubation Date/Time: 12/12/2017 12:25 PM Performed by: Vista Deck, CRNA Pre-anesthesia Checklist: Patient identified, Patient being monitored, Timeout performed, Emergency Drugs available and Suction available Patient Re-evaluated:Patient Re-evaluated prior to induction Oxygen Delivery Method: Circle System Utilized Preoxygenation: Pre-oxygenation with 100% oxygen Induction Type: IV induction Ventilation: Mask ventilation without difficulty Laryngoscope Size: Mac and 3 Grade View: Grade I Tube type: Oral Tube size: 7.0 mm Number of attempts: 1 Airway Equipment and Method: stylet and Oral airway Placement Confirmation: ETT inserted through vocal cords under direct vision,  positive ETCO2 and breath sounds checked- equal and bilateral Secured at: 22 cm Tube secured with: Tape Dental Injury: Teeth and Oropharynx as per pre-operative assessment

## 2017-12-12 NOTE — Interval H&P Note (Signed)
History and Physical Interval Note:  12/12/2017 12:11 PM  Erica [name]  has presented today for surgery, with the diagnosis of sterilization  The various methods of treatment have been discussed with the patient and family. After consideration of risks, benefits and other options for treatment, the patient has consented to  Procedure(s): LAPAROSCOPIC TUBAL LIGATION (Bilateral) as a surgical intervention .  The patient's history has been reviewed, patient examined, no change in status, stable for surgery.  I have reviewed the patient's chart and labs.  Questions were answered to the patient's satisfaction.     Florian Buff

## 2017-12-12 NOTE — Op Note (Signed)
Preoperative diagnosis: Multiparous female who desires permanent sterilization  Postop diagnosis: Same as above  Procedure: Bilateral partial salpingectomies using harmonic scalpel due to failed tubal ligation using electrocautery due to equipment failure  Surgeon:  Florian Buff, MD   Anesthesia: Laryngeal mask airway  Findings: Patient had a normal peritoneal cavity.  Uterus tubes and ovaries appeared normal Were no adhesions or other findings that were pathologic  Description of operation: Patient was taken to the operating room where she was placed in the supine position She underwent laryngeal mask airway general anesthesia She was placed in low lithotomy position She was prepped and draped in the usual sterile fashion for a laparoscopic procedure  An incision was made in the umbilicus Dissection was carried down to the rectus fascia The rectus fascia was grasped with a Coker clamp A Veres needle was employed and placed into the peritoneal cavity with 1 pass without difficulty The peritoneal cavity was confirmed with a saline drop test and low pressure reading The peritoneal cavity was insufflated to a pressure of 15   A non-bladed 11 mm trocar was placed into the peritoneal cavity under direct video laparoscope utilization without difficulty Peritoneal cavity was confirmed The preoperative plan was to perform a tubal ligation using electrocautery however the power source for the electrocautery was faulty and was not performing an adequate dehydration of the tissue to trust the result of interruption of the tube which would lead to permanent sterilization  As a result I made the intraoperative decision to use a harmonic scalpel and perform a partial salpingectomy removing the same amount of tube that would be removed with a modified Pomeroy bilateral tubal ligation at time of a C-section or postpartum tubal  An incision was made in the left lower quadrant A 5 mm  non-bladed trocar was placed into the peritoneal cavity under direct visualization without difficulty The harmonic scalpel was placed and used to transect a 3 cm segment of the right fallopian tube the mesosalpinx was also transected thus removing the 3 cm segment of tube which was sent the pathology for evaluation There was good hemostasis  The harmonic scalpel was then used to transect the tube in the distal isthmic and ampullary portion approximately 3 cm segment. Mesosalpinx was then transected using the harmonic scalpel and the tubal segment was removed and sent the pathology for evaluation Was good hemostasis of the pedicles bilaterally  The laparoscope was removed and both trochars were removed and the gas was allowed to escape 5+ pressure blood breaths were given to help remove additional intraperitoneal gas for postoperative pain management  The fascial incision was closed using 2-0 Vicryl in a interrupted fashion The subcutaneous tissue of the umbilical incision was reapproximated using 2-0 Vicryl  Both skin incisions were closed using skin staples  Patient tolerated the procedure well she experienced minimal blood loss and was taken to the recovery in good stable condition all counts are correct  EBL minimal  She received Ancef and Toradol preoperatively prophylactically  Florian Buff, MD 12/12/2017 1:06 PM

## 2017-12-12 NOTE — Anesthesia Preprocedure Evaluation (Signed)
Anesthesia Evaluation  Patient identified by MRN, date of birth, ID band Patient awake    Reviewed: Allergy & Precautions, H&P , NPO status , Patient's Chart, lab work & pertinent test results, reviewed documented beta blocker date and time   History of Anesthesia Complications (+) PONV and history of anesthetic complications  Airway Mallampati: II  TM Distance: >3 FB Neck ROM: full    Dental no notable dental hx.    Pulmonary neg pulmonary ROS, former smoker,    Pulmonary exam normal breath sounds clear to auscultation       Cardiovascular Exercise Tolerance: Good + dysrhythmias  Rhythm:regular Rate:Normal     Neuro/Psych  Headaches, PSYCHIATRIC DISORDERS Anxiety  Neuromuscular disease    GI/Hepatic negative GI ROS, Neg liver ROS,   Endo/Other  negative endocrine ROS  Renal/GU negative Renal ROS  negative genitourinary   Musculoskeletal   Abdominal   Peds  Hematology  (+) Blood dyscrasia, anemia ,   Anesthesia Other Findings   Reproductive/Obstetrics negative OB ROS                             Anesthesia Physical Anesthesia Plan  ASA: II  Anesthesia Plan: General   Post-op Pain Management:    Induction:   PONV Risk Score and Plan:   Airway Management Planned:   Additional Equipment:   Intra-op Plan:   Post-operative Plan:   Informed Consent: I have reviewed the patients History and Physical, chart, labs and discussed the procedure including the risks, benefits and alternatives for the proposed anesthesia with the patient or authorized representative who has indicated his/her understanding and acceptance.   Dental Advisory Given  Plan Discussed with: CRNA  Anesthesia Plan Comments:         Anesthesia Quick Evaluation

## 2017-12-12 NOTE — Anesthesia Postprocedure Evaluation (Signed)
Anesthesia Post Note  Patient: Erica [name]  Procedure(s) Performed: LAPAROSCOPIC BILATERAL TUBAL LIGATION PARTIAL SALPINGECTOMY (Bilateral )  Patient location during evaluation: PACU Anesthesia Type: General Level of consciousness: awake and alert and oriented Pain management: pain level controlled Vital Signs Assessment: post-procedure vital signs reviewed and stable Respiratory status: spontaneous breathing Cardiovascular status: stable Postop Assessment: no apparent nausea or vomiting Anesthetic complications: no     Last Vitals:  Vitals:   12/12/17 1312 12/12/17 1315  BP: 100/66 102/66  Pulse: 91 90  Resp: 14 17  Temp: 36.7 C   SpO2: 98% 97%    Last Pain:  Vitals:   12/12/17 1312  PainSc: 7                  ADAMS, AMY A

## 2017-12-12 NOTE — Transfer of Care (Signed)
Immediate Anesthesia Transfer of Care Note  Patient: Erica [name]  Procedure(s) Performed: LAPAROSCOPIC BILATERAL TUBAL LIGATION PARTIAL SALPINGECTOMY (Bilateral )  Patient Location: PACU  Anesthesia Type:General  Level of Consciousness: awake and patient cooperative  Airway & Oxygen Therapy: Patient Spontanous Breathing  Post-op Assessment: Report given to RN and Post -op Vital signs reviewed and stable  Post vital signs: Reviewed and stable  Last Vitals:  Vitals Value Taken Time  BP 100/66 12/12/2017  1:12 PM  Temp    Pulse 85 12/12/2017  1:13 PM  Resp 14 12/12/2017  1:13 PM  SpO2 92 % 12/12/2017  1:13 PM  Vitals shown include unvalidated device data.  Last Pain:  Vitals:   12/12/17 1111  PainSc: 0-No pain         Complications: No apparent anesthesia complications

## 2017-12-12 NOTE — H&P (Signed)
Preoperative History and Physical  Erica [name] is a 37 y.o. [phone] with Patient's last menstrual period was 11/26/2017. admitted for a with temporal arteritis, off hormone based birth control wants definitive BCM with tubal ligation.    PMH:        Past Medical History:  Diagnosis Date  . Anemia    with pregnancy  . Anxiety   . Cervical radiculitis   . Dysrhythmia    Irregular heart beat  associated with abnormal tachycardia  . Endometriosis   . Finger fracture, left 10/2004  . Hemangioma 03/12/2017   3.5 cm benign hemangioma in the posterior right hepatic lobe  . History of appendicitis   . Irregular heart beat   . Migraines   . OCD (obsessive compulsive disorder)   . Ovarian cyst    left, hemorrhagic (1) right side (2)  . PONV (postoperative nausea and vomiting)   . Sciatica   . Umbilical hernia   . Wears glasses     PSH:          Past Surgical History:  Procedure Laterality Date  . APPENDECTOMY    . ARTERY BIOPSY Left 10/25/2017   Procedure: BIOPSY TEMPORAL ARTERY LEFT;  Surgeon: Kieth Brightly, Arta Bruce, MD;  Location: [facility];  Service: General;  Laterality: Left;  . DIAGNOSTIC LAPAROSCOPY    . DILATION AND CURETTAGE OF UTERUS    . spinal tap     several  . TONSILLECTOMY AND ADENOIDECTOMY Bilateral 06/13/2016   Procedure: TONSILLECTOMY AND ADENOIDECTOMY;  Surgeon: Leta Baptist, MD;  Location: [facility];  Service: ENT;  Laterality: Bilateral;  . WISDOM TOOTH EXTRACTION      POb/GynH:              OB History    Gravida  3   Para  2   Term  1   Preterm  1   AB  1   Living  2     SAB  1   TAB      Ectopic      Multiple      Live Births  2           SH:   Social History        Tobacco Use  . Smoking status: Former Smoker    Packs/day: 1.00    Years: 10.00    Pack years: 10.00    Types: Cigarettes    Last attempt to quit: 01/15/2013    Years  since quitting: 4.8  . Smokeless tobacco: Never Used  . Tobacco comment: Quit smoking almost 2 years ago  Substance Use Topics  . Alcohol use: No    Alcohol/week: 0.0 standard drinks  . Drug use: No    FH:         Family History  Problem Relation Age of Onset  . Cancer Maternal Grandmother 21       ovarian, and uterine  . Cancer Mother 30       ovarian and Breast  . Bipolar disorder Mother   . Bipolar disorder Sister   . Depression Brother   . Healthy Son        x 2     Allergies:       Allergies  Allergen Reactions  . Amoxicillin Anaphylaxis  . Effexor [Venlafaxine] Hives  . Other Hives    Poppy Seeds    Medications:       Current Outpatient Medications:  .  cyclobenzaprine (  FLEXERIL) 10 MG tablet, Take 1 tablet (10 mg total) by mouth at bedtime as needed for muscle spasms. (Patient taking differently: Take 10 mg by mouth at bedtime. ), Disp: 30 tablet, Rfl: 5 .  ketorolac (TORADOL) 30 MG/ML injection, Inject 30 mg into the muscle as needed. , Disp: , Rfl:  .  MAGNESIUM OXIDE PO, Take 500 mg by mouth daily. , Disp: , Rfl:  .  Multiple Vitamin (MULTIVITAMIN) tablet, Take 1 tablet by mouth daily., Disp: , Rfl:  .  rizatriptan (MAXALT) 10 MG tablet, TAKE ONE TABLET BY MOUTH AS NEEDED FOR MIGRAINE. MAY REPEAT IN 2 HOURS IF NEEDED., Disp: 10 tablet, Rfl: 11 .  sertraline (ZOLOFT) 100 MG tablet, Take 2 tablets (200 mg total) by mouth daily. (Needs to be seen before next refill), Disp: 60 tablet, Rfl: 0 .  topiramate (TOPAMAX) 50 MG tablet, Take 1.5 tablets daily, Disp: 45 tablet, Rfl: 11  Review of Systems:   Review of Systems  Constitutional: Negative for fever, chills, weight loss, malaise/fatigue and diaphoresis.  HENT: Negative for hearing loss, ear pain, nosebleeds, congestion, sore throat, neck pain, tinnitus and ear discharge.   Eyes: Negative for blurred vision, double vision, photophobia, pain, discharge and redness.  Respiratory: Negative  for cough, hemoptysis, sputum production, shortness of breath, wheezing and stridor.   Cardiovascular: Negative for chest pain, palpitations, orthopnea, claudication, leg swelling and PND.  Gastrointestinal: Positive for abdominal pain. Negative for heartburn, nausea, vomiting, diarrhea, constipation, blood in stool and melena.  Genitourinary: Negative for dysuria, urgency, frequency, hematuria and flank pain.  Musculoskeletal: Negative for myalgias, back pain, joint pain and falls.  Skin: Negative for itching and rash.  Neurological: Negative for dizziness, tingling, tremors, sensory change, speech change, focal weakness, seizures, loss of consciousness, weakness and headaches.  Endo/Heme/Allergies: Negative for environmental allergies and polydipsia. Does not bruise/bleed easily.  Psychiatric/Behavioral: Negative for depression, suicidal ideas, hallucinations, memory loss and substance abuse. The patient is not nervous/anxious and does not have insomnia.      PHYSICAL EXAM:  Blood pressure 99/70, pulse 85, height 5\' 9"  (1.753 m), weight 229 lb 8 oz (104.1 kg), last menstrual period 11/26/2017.    Vitals reviewed. Constitutional: She is oriented to person, place, and time. She appears well-developed and well-nourished.  HENT:  Head: Normocephalic and atraumatic.  Right Ear: External ear normal.  Left Ear: External ear normal.  Nose: Nose normal.  Mouth/Throat: Oropharynx is clear and moist.  Eyes: Conjunctivae and EOM are normal. Pupils are equal, round, and reactive to light. Right eye exhibits no discharge. Left eye exhibits no discharge. No scleral icterus.  Neck: Normal range of motion. Neck supple. No tracheal deviation present. No thyromegaly present.  Cardiovascular: Normal rate, regular rhythm, normal heart sounds and intact distal pulses.  Exam reveals no gallop and no friction rub.   No murmur heard. Respiratory: Effort normal and breath sounds normal. No respiratory  distress. She has no wheezes. She has no rales. She exhibits no tenderness.  GI: Soft. Bowel sounds are normal. She exhibits no distension and no mass. There is tenderness. There is no rebound and no guarding.  Genitourinary:       Vulva is normal without lesions Vagina is pink moist without discharge Cervix normal in appearance and pap is normal Uterus is normal size, contour, position, consistency, mobility, non-tender Adnexa is negative with normal sized ovaries by sonogram  Musculoskeletal: Normal range of motion. She exhibits no edema and no tenderness.  Neurological: She is alert  and oriented to person, place, and time. She has normal reflexes. She displays normal reflexes. No cranial nerve deficit. She exhibits normal muscle tone. Coordination normal.  Skin: Skin is warm and dry. No rash noted. No erythema. No pallor.  Psychiatric: She has a normal mood and affect. Her behavior is normal. Judgment and thought content normal.    Labs: Results for orders placed or performed during the hospital encounter of 12/10/17 (from the past 168 hour(s))  CBC   Collection Time: 12/10/17  2:26 PM  Result Value Ref Range   WBC 6.0 4.0 - 10.5 K/uL   RBC 4.63 3.87 - 5.11 MIL/uL   Hemoglobin 13.3 12.0 - 15.0 g/dL   HCT 41.6 36.0 - 46.0 %   MCV 89.8 80.0 - 100.0 fL   MCH 28.7 26.0 - 34.0 pg   MCHC 32.0 30.0 - 36.0 g/dL   RDW 13.0 11.5 - 15.5 %   Platelets 264 150 - 400 K/uL   nRBC 0.0 0.0 - 0.2 %  Comprehensive metabolic panel   Collection Time: 12/10/17  2:26 PM  Result Value Ref Range   Sodium 139 135 - 145 mmol/L   Potassium 3.5 3.5 - 5.1 mmol/L   Chloride 109 98 - 111 mmol/L   CO2 23 22 - 32 mmol/L   Glucose, Bld 102 (H) 70 - 99 mg/dL   BUN 10 6 - 20 mg/dL   Creatinine, Ser 0.82 0.44 - 1.00 mg/dL   Calcium 9.0 8.9 - 10.3 mg/dL   Total Protein 7.2 6.5 - 8.1 g/dL   Albumin 4.0 3.5 - 5.0 g/dL   AST 16 15 - 41 U/L   ALT 17 0 - 44 U/L   Alkaline Phosphatase 52 38 - 126 U/L   Total  Bilirubin 0.5 0.3 - 1.2 mg/dL   GFR calc non Af Amer >60 >60 mL/min   GFR calc Af Amer >60 >60 mL/min   Anion gap 7 5 - 15  hCG, quantitative, pregnancy   Collection Time: 12/10/17  2:26 PM  Result Value Ref Range   hCG, Beta Chain, Quant, S <1 <5 mIU/mL  Rapid HIV screen (HIV 1/2 Ab+Ag)   Collection Time: 12/10/17  2:26 PM  Result Value Ref Range   HIV-1 P24 Antigen - HIV24 NON REACTIVE NON REACTIVE   HIV 1/2 Antibodies NON REACTIVE NON REACTIVE   Interpretation (HIV Ag Ab)      A non reactive test result means that HIV 1 or HIV 2 antibodies and HIV 1 p24 antigen were not detected in the specimen.  Urinalysis, Routine w reflex microscopic   Collection Time: 12/10/17  2:26 PM  Result Value Ref Range   Color, Urine YELLOW YELLOW   APPearance HAZY (A) CLEAR   Specific Gravity, Urine 1.016 1.005 - 1.030   pH 7.0 5.0 - 8.0   Glucose, UA NEGATIVE NEGATIVE mg/dL   Hgb urine dipstick NEGATIVE NEGATIVE   Bilirubin Urine NEGATIVE NEGATIVE   Ketones, ur NEGATIVE NEGATIVE mg/dL   Protein, ur NEGATIVE NEGATIVE mg/dL   Nitrite NEGATIVE NEGATIVE   Leukocytes, UA NEGATIVE NEGATIVE     EKG:    Orders placed or performed during the hospital encounter of 06/13/16  . EKG 12 lead  . EKG 12 lead    Imaging Studies: Imaging Results  No results found.      Assessment: multiparous female desires permanent sterilization       Patient Active Problem List   Diagnosis Date Noted  . Encounter for  Nexplanon removal 10/17/2017  . Migraine without aura and without status migrainosus, not intractable 10/12/2016  . Nexplanon insertion 03/23/2016  . Cervical disc disorder with radiculopathy of cervical region 09/24/2014  . GAD (generalized anxiety disorder) 05/02/2013  . OCD (obsessive compulsive disorder) 05/02/2013  . Irregular heart beat     Plan: Laparoscopic bilateral tubal ligation using electrocautery  Florian Buff 11/29/2017 3:33 PM

## 2017-12-12 NOTE — Discharge Instructions (Signed)
Laparoscopic Tubal Ligation, Care After Refer to this sheet in the next few weeks. These instructions provide you with information about caring for yourself after your procedure. Your health care provider may also give you more specific instructions. Your treatment has been planned according to current medical practices, but problems sometimes occur. Call your health care provider if you have any problems or questions after your procedure. What can I expect after the procedure? After the procedure, it is common to have:  A sore throat.  Discomfort in your shoulder.  Mild discomfort or cramping in your abdomen.  Gas pains.  Pain or soreness in the area where the surgical cut (incision) was made.  A bloated feeling.  Tiredness.  Nausea.  Vomiting.  Follow these instructions at home: Medicines  Take over-the-counter and prescription medicines only as told by your health care provider.  Do not take aspirin because it can cause bleeding.  Do not drive or operate heavy machinery while taking prescription pain medicine. Activity  Rest for the rest of the day.  Return to your normal activities as told by your health care provider. Ask your health care provider what activities are safe for you. Incision care   Follow instructions from your health care provider about how to take care of your incision. Make sure you: ? Wash your hands with soap and water before you change your bandage (dressing). If soap and water are not available, use hand sanitizer. ? Change your dressing as told by your health care provider. ? Leave stitches (sutures) in place. They may need to stay in place for 2 weeks or longer.  Check your incision area every day for signs of infection. Check for: ? More redness, swelling, or pain. ? More fluid or blood. ? Warmth. ? Pus or a bad smell. Other Instructions  Do not take baths, swim, or use a hot tub until your health care provider approves. You may take  showers.  Keep all follow-up visits as told by your health care provider. This is important.  Have someone help you with your daily household tasks for the first few days. Contact a health care provider if:  You have more redness, swelling, or pain around your incision.  Your incision feels warm to the touch.  You have pus or a bad smell coming from your incision.  The edges of your incision break open after the sutures have been removed.  Your pain does not improve after 2-3 days.  You have a rash.  You repeatedly become dizzy or light-headed.  Your pain medicine is not helping.  You are constipated. Get help right away if:  You have a fever.  You faint.  You have increasing pain in your abdomen.  You have severe pain in one or both of your shoulders.  You have fluid or blood coming from your sutures or from your vagina.  You have shortness of breath or difficulty breathing.  You have chest pain or leg pain.  You have ongoing nausea, vomiting, or diarrhea. This information is not intended to replace advice given to you by your health care provider. Make sure you discuss any questions you have with your health care provider. Document Released: 09/02/2004 Document Revised: 07/19/2015 Document Reviewed: 01/24/2015 Elsevier Interactive Patient Education  2018 [employer] Anesthesia, Adult, Care After These instructions provide you with information about caring for yourself after your procedure. Your health care provider may also give you more specific instructions. Your treatment has been  planned according to current medical practices, but problems sometimes occur. Call your health care provider if you have any problems or questions after your procedure. What can I expect after the procedure? After the procedure, it is common to have:  Vomiting.  A sore throat.  Mental slowness.  It is common to feel:  Nauseous.  Cold or  shivery.  Sleepy.  Tired.  Sore or achy, even in parts of your body where you did not have surgery.  Follow these instructions at home: For at least 24 hours after the procedure:  Do not: ? Participate in activities where you could fall or become injured. ? Drive. ? Use heavy machinery. ? Drink alcohol. ? Take sleeping pills or medicines that cause drowsiness. ? Make important decisions or sign legal documents. ? Take care of children on your own.  Rest. Eating and drinking  If you vomit, drink water, juice, or soup when you can drink without vomiting.  Drink enough fluid to keep your urine clear or pale yellow.  Make sure you have little or no nausea before eating solid foods.  Follow the diet recommended by your health care provider. General instructions  Have a responsible adult stay with you until you are awake and alert.  Return to your normal activities as told by your health care provider. Ask your health care provider what activities are safe for you.  Take over-the-counter and prescription medicines only as told by your health care provider.  If you smoke, do not smoke without supervision.  Keep all follow-up visits as told by your health care provider. This is important. Contact a health care provider if:  You continue to have nausea or vomiting at home, and medicines are not helpful.  You cannot drink fluids or start eating again.  You cannot urinate after 8-12 hours.  You develop a skin rash.  You have fever.  You have increasing redness at the site of your procedure. Get help right away if:  You have difficulty breathing.  You have chest pain.  You have unexpected bleeding.  You feel that you are having a life-threatening or urgent problem. This information is not intended to replace advice given to you by your health care provider. Make sure you discuss any questions you have with your health care provider. Document Released: 05/22/2000  Document Revised: 07/19/2015 Document Reviewed: 01/28/2015 Elsevier Interactive Patient Education  [employer].

## 2017-12-13 ENCOUNTER — Encounter (HOSPITAL_COMMUNITY): Payer: Self-pay | Admitting: Obstetrics & Gynecology

## 2017-12-20 ENCOUNTER — Ambulatory Visit (INDEPENDENT_AMBULATORY_CARE_PROVIDER_SITE_OTHER): Payer: Medicaid Other | Admitting: Obstetrics & Gynecology

## 2017-12-20 ENCOUNTER — Encounter: Payer: Self-pay | Admitting: Obstetrics & Gynecology

## 2017-12-20 VITALS — BP 113/73 | HR 88 | Ht 69.0 in | Wt 226.0 lb

## 2017-12-20 DIAGNOSIS — Z9889 Other specified postprocedural states: Secondary | ICD-10-CM

## 2017-12-20 NOTE — Progress Notes (Signed)
  HPI: Patient returns for routine postoperative follow-up having undergone lap BTL on 12/12/2017.  The patient's immediate postoperative recovery has been unremarkable. Since hospital discharge the patient reports gassy and sluggisn bowel function.   Current Outpatient Medications: cyclobenzaprine (FLEXERIL) 10 MG tablet, Take 1 tablet (10 mg total) by mouth at bedtime as needed for muscle spasms. (Patient taking differently: Take 10 mg by mouth at bedtime. ), Disp: 30 tablet, Rfl: 5 HYDROcodone-acetaminophen (NORCO/VICODIN) 5-325 MG tablet, Take 1 tablet by mouth every 6 (six) hours as needed., Disp: 15 tablet, Rfl: 0 Magnesium Oxide 500 MG TABS, Take 500 mg by mouth at bedtime., Disp: , Rfl:   Multiple Vitamin (MULTIVITAMIN) tablet, Take 1 tablet by mouth at bedtime. , Disp: , Rfl:  ondansetron (ZOFRAN ODT) 8 MG disintegrating tablet, Take 1 tablet (8 mg total) by mouth every 8 (eight) hours as needed for nausea or vomiting., Disp: 20 tablet, Rfl: 0 promethazine (PHENERGAN) 25 MG tablet, Take 25 mg by mouth every 6 (six) hours as needed. for nausea, Disp: , Rfl: 0 rizatriptan (MAXALT) 10 MG tablet, TAKE ONE TABLET BY MOUTH AS NEEDED FOR MIGRAINE. MAY REPEAT IN 2 HOURS IF NEEDED., Disp: 10 tablet, Rfl: 11 sertraline (ZOLOFT) 100 MG tablet, Take 2 tablets (200 mg total) by mouth daily. (Needs to be seen before next refill) (Patient taking differently: Take 200 mg by mouth at bedtime. (Needs to be seen before next refill)), Disp: 60 tablet, Rfl: 0 sulfamethoxazole-trimethoprim (BACTRIM DS,SEPTRA DS) 800-160 MG tablet, Take 2 tablets by mouth 2 (two) times daily., Disp: , Rfl: 0 topiramate (TOPAMAX) 50 MG tablet, Take 1.5 tablets daily (Patient taking differently: Take 75 mg by mouth at bedtime. ), Disp: 45 tablet, Rfl: 11 ketorolac (TORADOL) 10 MG tablet, Take 1 tablet (10 mg total) by mouth every 8 (eight) hours as needed. (Patient not taking: Reported on 12/20/2017), Disp: 15 tablet, Rfl: 0  No  current facility-administered medications for this visit.     Blood pressure 113/73, pulse 88, height 5\' 9"  (1.753 m), weight 226 lb (102.5 kg), last menstrual period 11/26/2017.  Physical Exam: Incision clean dry intact Staples removed abdomen soft benign  Diagnostic Tests:   Pathology: benign  Impression: S/p laparoscopic BTL  Plan: Recommend dulcolax supository No gas producing foods  Follow up: prn    Florian Buff, MD

## 2017-12-25 ENCOUNTER — Ambulatory Visit: Payer: Medicaid Other | Admitting: Neurology
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
## 2018-03-01 ENCOUNTER — Ambulatory Visit: Payer: Medicaid Other | Admitting: Neurology
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
## 2018-03-22 ENCOUNTER — Ambulatory Visit (INDEPENDENT_AMBULATORY_CARE_PROVIDER_SITE_OTHER): Payer: Medicaid Other | Admitting: Psychiatry

## 2018-03-22 ENCOUNTER — Encounter (HOSPITAL_COMMUNITY): Payer: Self-pay | Admitting: Psychiatry

## 2018-03-22 VITALS — BP 104/62 | HR 86 | Ht 69.0 in | Wt 210.0 lb

## 2018-03-22 DIAGNOSIS — F331 Major depressive disorder, recurrent, moderate: Secondary | ICD-10-CM

## 2018-03-22 DIAGNOSIS — F4310 Post-traumatic stress disorder, unspecified: Secondary | ICD-10-CM | POA: Diagnosis not present

## 2018-03-22 DIAGNOSIS — F1021 Alcohol dependence, in remission: Secondary | ICD-10-CM | POA: Diagnosis not present

## 2018-03-22 DIAGNOSIS — F429 Obsessive-compulsive disorder, unspecified: Secondary | ICD-10-CM

## 2018-03-22 LAB — TSH: TSH: 0.5 mIU/L

## 2018-03-22 MED ORDER — BREXPIPRAZOLE 0.5 MG PO TABS: 0.5000 mg | ORAL_TABLET | Freq: Every day | ORAL | 0 refills | Status: DC

## 2018-03-22 MED ORDER — SERTRALINE HCL 100 MG PO TABS: 200.0000 mg | ORAL_TABLET | Freq: Every day | ORAL | 0 refills | Status: DC

## 2018-03-22 NOTE — Patient Instructions (Addendum)
1. Continue Sertraline 200 mg daily  2. Start Rexulti 0.5 mg daily  3. Return to clinic in one month for 30 mins 4. Referral to therapy (here or [facility]) CONTACT INFORMATION  What to do if you need to get in touch with someone regarding a psychiatric issue:  1. EMERGENCY: For psychiatric emergencies (if you are suicidal or if there are any other safety issues) call 911 and/or go to your nearest Emergency Room immediately.   2. IF YOU NEED SOMEONE TO TALK TO RIGHT NOW: Given my clinical responsibilities, I may not be able to speak with you over the phone for a prolonged period of time.  a. You may always call The National Suicide Prevention Lifeline at 1-800-273-TALK 575-563-1334).  b. Your county of residence will also have local crisis services. For [facility]: [place] at [phone] ([place])

## 2018-03-25 ENCOUNTER — Telehealth (HOSPITAL_COMMUNITY): Payer: Self-pay | Admitting: *Deleted

## 2018-03-25 ENCOUNTER — Other Ambulatory Visit (HOSPITAL_COMMUNITY): Payer: Self-pay | Admitting: Psychiatry

## 2018-03-25 MED ORDER — BUPROPION HCL ER (XL) 150 MG PO TB24: 150.0000 mg | ORAL_TABLET | Freq: Every day | ORAL | 0 refills | Status: DC

## 2018-03-25 NOTE — Telephone Encounter (Signed)
Advise her to restart Wellbutrin150 mg daily, discontinue rexulti. I ordered wellbutrin for one month.

## 2018-03-25 NOTE — Telephone Encounter (Signed)
Dr Modesta Messing Patient called & stated since taking Samples of the Rexulti she's been having diarrhea, can't keep food down, nausea's. She said all she wants is to be back on her Wellbutrin that worked for her.

## 2018-04-06 ENCOUNTER — Other Ambulatory Visit: Payer: Self-pay

## 2018-04-06 ENCOUNTER — Encounter (HOSPITAL_COMMUNITY): Payer: Self-pay

## 2018-04-06 ENCOUNTER — Emergency Department (HOSPITAL_COMMUNITY)
Admission: EM | Admit: 2018-04-06 | Discharge: 2018-04-06 | Disposition: A | Discharge status: Home / Self Care | Payer: Medicaid Other | Attending: Emergency Medicine | Admitting: Emergency Medicine

## 2018-04-06 ENCOUNTER — Emergency Department (HOSPITAL_COMMUNITY): Payer: Medicaid Other

## 2018-04-06 DIAGNOSIS — Y929 Unspecified place or not applicable: Secondary | ICD-10-CM | POA: Insufficient documentation

## 2018-04-06 DIAGNOSIS — Z79899 Other long term (current) drug therapy: Secondary | ICD-10-CM | POA: Diagnosis not present

## 2018-04-06 DIAGNOSIS — Y9389 Activity, other specified: Secondary | ICD-10-CM | POA: Insufficient documentation

## 2018-04-06 DIAGNOSIS — S62336A Displaced fracture of neck of fifth metacarpal bone, right hand, initial encounter for closed fracture: Secondary | ICD-10-CM | POA: Insufficient documentation

## 2018-04-06 DIAGNOSIS — S6991XA Unspecified injury of right wrist, hand and finger(s), initial encounter: Secondary | ICD-10-CM | POA: Diagnosis present

## 2018-04-06 DIAGNOSIS — Z87891 Personal history of nicotine dependence: Secondary | ICD-10-CM | POA: Diagnosis not present

## 2018-04-06 DIAGNOSIS — W2209XA Striking against other stationary object, initial encounter: Secondary | ICD-10-CM | POA: Insufficient documentation

## 2018-04-06 DIAGNOSIS — Y999 Unspecified external cause status: Secondary | ICD-10-CM | POA: Insufficient documentation

## 2018-04-06 DIAGNOSIS — S62339A Displaced fracture of neck of unspecified metacarpal bone, initial encounter for closed fracture: Secondary | ICD-10-CM

## 2018-04-06 MED ORDER — HYDROCODONE-ACETAMINOPHEN 5-325 MG PO TABS: 1.0000 | ORAL_TABLET | ORAL | 0 refills | Status: DC | PRN

## 2018-04-06 MED ORDER — PROMETHAZINE HCL 12.5 MG PO TABS
12.5000 mg | ORAL_TABLET | Freq: Once | ORAL | Status: AC
  Administered 2018-04-06: 12.5 mg via ORAL
  Filled 2018-04-06: qty 1

## 2018-04-06 MED ORDER — IBUPROFEN 800 MG PO TABS
800.0000 mg | ORAL_TABLET | Freq: Once | ORAL | Status: AC
  Administered 2018-04-06: 800 mg via ORAL
  Filled 2018-04-06: qty 1

## 2018-04-06 MED ORDER — HYDROCODONE-ACETAMINOPHEN 5-325 MG PO TABS
2.0000 | ORAL_TABLET | Freq: Once | ORAL | Status: AC
  Administered 2018-04-06: 2 via ORAL
  Filled 2018-04-06: qty 2
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
## 2018-04-06 NOTE — ED Provider Notes (Signed)
[facility] EMERGENCY DEPARTMENT Provider Note   CSN: [id number] Arrival date & time: 04/06/18  1228     History   Chief Complaint Chief Complaint  Patient presents with  . Hand Pain    HPI Erica [name] is a 38 y.o. female.  Patient is a 38 year old female who presents to the emergency department with complaint of right hand pain.  The patient states that she had a wall on last night.  She is noted to have swelling of the hand.  She has pain.  She is unable to completely move the little finger of the right hand.  No other injury reported.  The patient denies being on any anticoagulation medications.  No previous operations or procedures involving the hand.  The history is provided by the patient.  Hand Pain  Pertinent negatives include no chest pain, no abdominal pain and no shortness of breath.    Past Medical History:  Diagnosis Date  . Anemia    with pregnancy  . Anxiety   . Cervical radiculitis   . Dysrhythmia    Irregular heart beat  associated with abnormal tachycardia  . Endometriosis   . Finger fracture, left 10/2004  . Hemangioma 03/12/2017   3.5 cm benign hemangioma in the posterior right hepatic lobe  . History of appendicitis   . Irregular heart beat   . Migraines   . OCD (obsessive compulsive disorder)   . Ovarian cyst    left, hemorrhagic (1) right side (2)  . PONV (postoperative nausea and vomiting)   . Sciatica   . Umbilical hernia   . Wears glasses     Patient Active Problem List   Diagnosis Date Noted  . MDD (major depressive disorder), recurrent episode, moderate ([place]) 03/22/2018  . Encounter for Nexplanon removal 10/17/2017  . Migraine without aura and without status migrainosus, not intractable 10/12/2016  . Nexplanon insertion 03/23/2016  . Cervical disc disorder with radiculopathy of cervical region 09/24/2014  . GAD (generalized anxiety disorder) 05/02/2013  . OCD (obsessive compulsive disorder) 05/02/2013  . Irregular heart beat       Past Surgical History:  Procedure Laterality Date  . APPENDECTOMY    . ARTERY BIOPSY Left 10/25/2017   Procedure: BIOPSY TEMPORAL ARTERY LEFT;  Surgeon: Kieth Brightly, Arta Bruce, MD;  Location: [place];  Service: General;  Laterality: Left;  . DIAGNOSTIC LAPAROSCOPY    . DILATION AND CURETTAGE OF UTERUS    . LAPAROSCOPIC TUBAL LIGATION Bilateral 12/12/2017   Procedure: LAPAROSCOPIC BILATERAL TUBAL LIGATION PARTIAL SALPINGECTOMY;  Surgeon: Florian Buff, MD;  Location: [facility];  Service: Gynecology;  Laterality: Bilateral;  . spinal tap     several  . TONSILLECTOMY AND ADENOIDECTOMY Bilateral 06/13/2016   Procedure: TONSILLECTOMY AND ADENOIDECTOMY;  Surgeon: Leta Baptist, MD;  Location: [place];  Service: ENT;  Laterality: Bilateral;  . WISDOM TOOTH EXTRACTION       OB History    Gravida  3   Para  2   Term  1   Preterm  1   AB  1   Living  2     SAB  1   TAB      Ectopic      Multiple      Live Births  2            Home Medications    Prior to Admission medications   Medication Sig Start Date End Date Taking? Authorizing Provider  buPROPion [facility]  XL) 150 MG 24 hr tablet Take 1 tablet (150 mg total) by mouth daily. 03/25/18   Norman Clay, MD  cyclobenzaprine (FLEXERIL) 10 MG tablet Take 1 tablet (10 mg total) by mouth at bedtime as needed for muscle spasms. Patient taking differently: Take 10 mg by mouth at bedtime.  08/29/17   Narda Amber K, DO  HYDROcodone-acetaminophen (NORCO/VICODIN) 5-325 MG tablet Take 1 tablet by mouth every 6 (six) hours as needed. 12/12/17   Florian Buff, MD  ketorolac (TORADOL) 10 MG tablet Take 1 tablet (10 mg total) by mouth every 8 (eight) hours as needed. 12/12/17   Florian Buff, MD  Magnesium Oxide 500 MG TABS Take 500 mg by mouth at bedtime.    [provider]  Multiple Vitamin (MULTIVITAMIN) tablet Take 1 tablet by mouth at bedtime.     [provider]  ondansetron  (ZOFRAN ODT) 8 MG disintegrating tablet Take 1 tablet (8 mg total) by mouth every 8 (eight) hours as needed for nausea or vomiting. 12/12/17   Florian Buff, MD  promethazine (PHENERGAN) 25 MG tablet Take 25 mg by mouth every 6 (six) hours as needed. for nausea 12/15/17   [provider]  rizatriptan (MAXALT) 10 MG tablet TAKE ONE TABLET BY MOUTH AS NEEDED FOR MIGRAINE. MAY REPEAT IN 2 HOURS IF NEEDED. 08/29/17   Narda Amber K, DO  sertraline (ZOLOFT) 100 MG tablet Take 2 tablets (200 mg total) by mouth daily. (Needs to be seen before next refill) Patient taking differently: Take 200 mg by mouth at bedtime. (Needs to be seen before next refill) 11/23/17   Claretta Fraise, MD  sertraline (ZOLOFT) 100 MG tablet Take 2 tablets (200 mg total) by mouth daily. 03/22/18   Norman Clay, MD  sulfamethoxazole-trimethoprim (BACTRIM DS,SEPTRA DS) 800-160 MG tablet Take 2 tablets by mouth 2 (two) times daily. 12/15/17   [provider]  topiramate (TOPAMAX) 50 MG tablet Take 1.5 tablets daily Patient taking differently: Take 75 mg by mouth at bedtime.  08/29/17   Alda Berthold, DO    Family History Family History  Problem Relation Age of Onset  . Cancer Maternal Grandmother 21       ovarian, and uterine  . Cancer Mother 30       ovarian and Breast  . Bipolar disorder Mother   . Bipolar disorder Sister   . Depression Brother   . Healthy Son        x 2    Social History Social History   Tobacco Use  . Smoking status: Former Smoker    Packs/day: 1.00    Years: 10.00    Pack years: 10.00    Types: Cigarettes    Last attempt to quit: 01/15/2013    Years since quitting: 5.2  . Smokeless tobacco: Never Used  . Tobacco comment: Quit smoking almost 2 years ago  Substance Use Topics  . Alcohol use: Yes    Alcohol/week: 0.0 standard drinks    Comment: occassional  . Drug use: No     Allergies   Amoxicillin; Effexor [venlafaxine]; and Other   Review of Systems Review of  Systems  Constitutional: Negative for activity change.       All ROS Neg except as noted in HPI  HENT: Negative for nosebleeds.   Eyes: Negative for photophobia and discharge.  Respiratory: Negative for cough, shortness of breath and wheezing.   Cardiovascular: Negative for chest pain and palpitations.  Gastrointestinal: Negative for abdominal pain  and blood in stool.  Genitourinary: Negative for dysuria, frequency and hematuria.  Musculoskeletal: Negative for arthralgias, back pain and neck pain.  Skin: Negative.   Neurological: Negative for dizziness, seizures and speech difficulty.  Psychiatric/Behavioral: Negative for confusion and hallucinations.     Physical Exam Updated Vital Signs BP 120/79 (BP Location: Left Arm)   Pulse 75   Temp 97.9 F (36.6 C) (Temporal)   Resp 18   Ht 5\' 9"  (1.753 m)   Wt 95.3 kg   LMP 03/06/2018 Comment: tubal ligation  SpO2 99%   BMI 31.01 kg/m   Physical Exam Vitals signs and nursing note reviewed.  Constitutional:      Appearance: She is well-developed. She is not toxic-appearing.  HENT:     Head: Normocephalic.     Right Ear: Tympanic membrane and external ear normal.     Left Ear: Tympanic membrane and external ear normal.  Eyes:     General: Lids are normal.     Pupils: Pupils are equal, round, and reactive to light.  Neck:     Musculoskeletal: Normal range of motion and neck supple.     Vascular: No carotid bruit.  Cardiovascular:     Rate and Rhythm: Normal rate and regular rhythm.     Pulses: Normal pulses.     Heart sounds: Normal heart sounds.  Pulmonary:     Effort: No respiratory distress.     Breath sounds: Normal breath sounds.  Abdominal:     General: Bowel sounds are normal.     Palpations: Abdomen is soft.     Tenderness: There is no abdominal tenderness. There is no guarding.  Musculoskeletal: Normal range of motion.     Right hand: She exhibits tenderness, bony tenderness and deformity. Normal sensation noted.        Hands:  Lymphadenopathy:     Head:     Right side of head: No submandibular adenopathy.     Left side of head: No submandibular adenopathy.     Cervical: No cervical adenopathy.  Skin:    General: Skin is warm and dry.  Neurological:     Mental Status: She is alert and oriented to person, place, and time.     Cranial Nerves: No cranial nerve deficit.     Sensory: No sensory deficit.  Psychiatric:        Speech: Speech normal.      ED Treatments / Results  Labs (all labs ordered are listed, but only abnormal results are displayed) Labs Reviewed - No data to display  EKG None  Radiology Dg Hand Complete Right  Result Date: 04/06/2018 CLINICAL DATA:  Swelling after punching a wall.  Initial encounter. EXAM: RIGHT HAND - COMPLETE 3+ VIEW COMPARISON:  None. FINDINGS: Mildly displaced oblique fracture through the distal aspect of the fifth metacarpal with overlying soft tissue swelling. No evidence for associated acute fractures. IMPRESSION: Mildly displaced oblique fracture through the distal aspect of the fifth metacarpal with overlying soft tissue swelling. Electronically Signed   By: Lovey Newcomer M.D.   On: 04/06/2018 14:07    Procedures Procedures (including critical care time) FRACTURE CARE RIGHT HAND.  Patient states that she struck an object.  She had pain and swelling of the hand.  She came in today for evaluation and was noted to have a displaced fracture through the distal aspect of the fifth metacarpal.  I discussed the fracture with the patient in terms which he understands.  I have also discussed  with her the need for immobilization.  Patient gives permission for this procedure.  Patient identified by armband.  Procedural timeout taken.  The patient was fitted with buddy tape finger splint, and then a ulnar gutter splint.  After application of the splint, the capillary refill is less than 2 seconds.  There is no complaint of the splint being too tight.  There are  no color changes or temperature changes appreciated.  The patient was treated in the emergency department with medication for pain, and a sling was provided. Patient tolerated the procedure without problem.  Medications Ordered in ED Medications  ibuprofen (ADVIL,MOTRIN) tablet 800 mg (has no administration in time range)  promethazine (PHENERGAN) tablet 12.5 mg (has no administration in time range)  HYDROcodone-acetaminophen (NORCO/VICODIN) 5-325 MG per tablet 2 tablet (has no administration in time range)     Initial Impression / Assessment and Plan / ED Course  I have reviewed the triage vital signs and the nursing notes.  Pertinent labs & imaging results that were available during my care of the patient were reviewed by me and considered in my medical decision making (see chart for details).      Final Clinical Impressions(s) / ED Diagnoses MDM  Vital signs are within normal limits.  Pulse oximetry is 99% on room air.  Within normal limits by my interpretation.  Patient injured the right hand after punching a wall.  X-ray shows a displaced oblique fracture through the distal aspect of the fifth metacarpal.  The patient's finger was splinted and placed in a ulnar gutter splint.  Patient treated in the mid emergency department for pain.  The patient will follow-up with hand specialist.   Final diagnoses:  Closed boxer's fracture, initial encounter    ED Discharge Orders         Ordered    HYDROcodone-acetaminophen (NORCO/VICODIN) 5-325 MG tablet  Every 4 hours PRN     04/06/18 1619           Lily Kocher, PA-C 04/07/18 2115    Francine Graven, DO 04/08/18 1506

## 2018-04-06 NOTE — Discharge Instructions (Addendum)
You have a fracture of a bone in your right hand.  Please keep your hand elevated above your heart is much as possible.  Use an ice pack today and tomorrow.  Use your sling.  Use 1000 mg of Tylenol and 400 mg of ibuprofen every 6 hours.  Use Norco 1 or 2 tablets every 4 hours as needed for more severe pain. This medication may cause drowsiness. Please do not drink, drive, or participate in activity that requires concentration while taking this medication.  Please see Dr. Ninfa Linden, or the orthopedic specialist of your choice concerning your fracture as soon as possible.

## 2018-04-08 ENCOUNTER — Ambulatory Visit (INDEPENDENT_AMBULATORY_CARE_PROVIDER_SITE_OTHER): Payer: Medicaid Other | Admitting: Orthopaedic Surgery

## 2018-04-08 ENCOUNTER — Encounter (INDEPENDENT_AMBULATORY_CARE_PROVIDER_SITE_OTHER): Payer: Self-pay | Admitting: Orthopaedic Surgery

## 2018-04-08 DIAGNOSIS — S62336A Displaced fracture of neck of fifth metacarpal bone, right hand, initial encounter for closed fracture: Secondary | ICD-10-CM

## 2018-04-08 NOTE — Progress Notes (Signed)
Office Visit Note   Patient: Erica [name]           Date of Birth: 01/26/1981           MRN: [id number] Visit Date: 04/08/2018              Requested by: Claretta Fraise, MD [address], [place] [id number] PCP: Claretta Fraise, MD   Assessment & Plan: Visit Diagnoses:  1. Displaced fracture of neck of fifth metacarpal bone, right hand, initial encounter for closed fracture     Plan: I did review the x-rays with the patient and she understands she has a significant right fifth metacarpal fracture (boxer's fracture).  Based on her x-rays, I do feel that she would benefit from close versus open reduction and likely pinning of this fracture.  This is something that I have not done in a long period of time I have spoken to my partner Dr. Erlinda Hong who did review her x-rays and agree that she needs surgery.  I talked to her about this and we have agreed to set her up for surgery for this week on Wednesday in the morning as an outpatient at [facility] day surgery.  I placed her back in a splint today.  We talked about surgery and the risk and benefits involved as well as the rationale behind recommending surgery.  We talked about nonsurgical options as well.    Follow-Up Instructions: Follow-up will be arranged by Dr. Vista Lawman after her surgery.  Orders:  No orders of the defined types were placed in this encounter.  No orders of the defined types were placed in this encounter.     Procedures: No procedures performed   Clinical Data: No additional findings.   Subjective: Chief Complaint  Patient presents with  . 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  She was seen at [facility] in [place] and found to have 5th metacarpal neck fracture with displacement.  She was placed in ulnar gutter splint and given follow-up in our office since  we were on call.  She does report the injury occurred when she punched a wall out of anger.  She reports significant pain in her hand but denies any numbness and tingling.  HPI  Review of Systems She currently denies any headache, chest pain, shortness of breath, fever, chills, nausea, vomiting.  Objective: Vital Signs: There were no vitals taken for this visit.  Physical Exam She is alert and orient x3 and in no acute distress Ortho Exam Examination of her right hand shows is neurovascularly intact.  There is significant swelling over the fifth ray with bruising as well.  There is an obvious deformity of the metacarpal head in terms of disposition with the other metacarpals.  There is no rotation deformity the fingers but it is hard to get a good exam on her due to her pain. Specialty Comments:  No specialty comments available.  Imaging: No results found. X-rays on the canopy system including 3 views of the right hand do show a palmar angulated and displaced fifth metacarpal neck fracture.  PMFS History: Patient Active Problem List   Diagnosis Date Noted  . MDD (major depressive disorder), recurrent episode, moderate ([place]) 03/22/2018  . Encounter for Nexplanon removal 10/17/2017  . Migraine without aura and without status migrainosus, not intractable 10/12/2016  . Nexplanon  insertion 03/23/2016  . Cervical disc disorder with radiculopathy of cervical region 09/24/2014  . GAD (generalized anxiety disorder) 05/02/2013  . OCD (obsessive compulsive disorder) 05/02/2013  . Irregular heart beat    Past Medical History:  Diagnosis Date  . Anemia    with pregnancy  . Anxiety   . Cervical radiculitis   . Dysrhythmia    Irregular heart beat  associated with abnormal tachycardia  . Endometriosis   . Finger fracture, left 10/2004  . Hemangioma 03/12/2017   3.5 cm benign hemangioma in the posterior right hepatic lobe  . History of appendicitis   . Irregular heart beat   . Migraines    . OCD (obsessive compulsive disorder)   . Ovarian cyst    left, hemorrhagic (1) right side (2)  . PONV (postoperative nausea and vomiting)   . Sciatica   . Umbilical hernia   . Wears glasses     Family History  Problem Relation Age of Onset  . Cancer Maternal Grandmother 21       ovarian, and uterine  . Cancer Mother 30       ovarian and Breast  . Bipolar disorder Mother   . Bipolar disorder Sister   . Depression Brother   . Healthy Son        x 2    Past Surgical History:  Procedure Laterality Date  . APPENDECTOMY    . ARTERY BIOPSY Left 10/25/2017   Procedure: BIOPSY TEMPORAL ARTERY LEFT;  Surgeon: Kieth Brightly, Arta Bruce, MD;  Location: [place];  Service: General;  Laterality: Left;  . DIAGNOSTIC LAPAROSCOPY    . DILATION AND CURETTAGE OF UTERUS    . LAPAROSCOPIC TUBAL LIGATION Bilateral 12/12/2017   Procedure: LAPAROSCOPIC BILATERAL TUBAL LIGATION PARTIAL SALPINGECTOMY;  Surgeon: Florian Buff, MD;  Location: [facility];  Service: Gynecology;  Laterality: Bilateral;  . spinal tap     several  . TONSILLECTOMY AND ADENOIDECTOMY Bilateral 06/13/2016   Procedure: TONSILLECTOMY AND ADENOIDECTOMY;  Surgeon: Leta Baptist, MD;  Location: [place];  Service: ENT;  Laterality: Bilateral;  . WISDOM TOOTH EXTRACTION     Social History   Occupational History  . Not on file  Tobacco Use  . Smoking status: Former Smoker    Packs/day: 1.00    Years: 10.00    Pack years: 10.00    Types: Cigarettes    Last attempt to quit: 01/15/2013    Years since quitting: 5.2  . Smokeless tobacco: Never Used  . Tobacco comment: Quit smoking almost 2 years ago  Substance and Sexual Activity  . Alcohol use: Yes    Alcohol/week: 0.0 standard drinks    Comment: occassional  . Drug use: No  . Sexual activity: Yes    Birth control/protection: None, Condom, Spermicide

## 2018-04-09 ENCOUNTER — Other Ambulatory Visit: Payer: Self-pay

## 2018-04-09 ENCOUNTER — Encounter (HOSPITAL_BASED_OUTPATIENT_CLINIC_OR_DEPARTMENT_OTHER): Payer: Self-pay | Admitting: *Deleted

## 2018-04-09 DIAGNOSIS — S62336A Displaced fracture of neck of fifth metacarpal bone, right hand, initial encounter for closed fracture: Secondary | ICD-10-CM

## 2018-04-10 ENCOUNTER — Ambulatory Visit (HOSPITAL_BASED_OUTPATIENT_CLINIC_OR_DEPARTMENT_OTHER)
Admission: RE | Admit: 2018-04-10 | Discharge: 2018-04-10 | Disposition: A | Discharge status: Home / Self Care | Payer: Medicaid Other | Attending: Orthopaedic Surgery | Admitting: Orthopaedic Surgery

## 2018-04-10 ENCOUNTER — Encounter (HOSPITAL_BASED_OUTPATIENT_CLINIC_OR_DEPARTMENT_OTHER)
Admission: RE | Disposition: A | Discharge status: Home / Self Care | Payer: Self-pay | Source: Home / Self Care | Attending: Orthopaedic Surgery

## 2018-04-10 ENCOUNTER — Ambulatory Visit (HOSPITAL_BASED_OUTPATIENT_CLINIC_OR_DEPARTMENT_OTHER): Payer: Medicaid Other | Admitting: Anesthesiology

## 2018-04-10 ENCOUNTER — Encounter (HOSPITAL_BASED_OUTPATIENT_CLINIC_OR_DEPARTMENT_OTHER): Payer: Self-pay

## 2018-04-10 ENCOUNTER — Other Ambulatory Visit: Payer: Self-pay

## 2018-04-10 DIAGNOSIS — Z79899 Other long term (current) drug therapy: Secondary | ICD-10-CM | POA: Diagnosis not present

## 2018-04-10 DIAGNOSIS — F329 Major depressive disorder, single episode, unspecified: Secondary | ICD-10-CM | POA: Insufficient documentation

## 2018-04-10 DIAGNOSIS — S62336A Displaced fracture of neck of fifth metacarpal bone, right hand, initial encounter for closed fracture: Secondary | ICD-10-CM | POA: Insufficient documentation

## 2018-04-10 DIAGNOSIS — Z880 Allergy status to penicillin: Secondary | ICD-10-CM | POA: Insufficient documentation

## 2018-04-10 DIAGNOSIS — Z87891 Personal history of nicotine dependence: Secondary | ICD-10-CM | POA: Insufficient documentation

## 2018-04-10 DIAGNOSIS — G43909 Migraine, unspecified, not intractable, without status migrainosus: Secondary | ICD-10-CM | POA: Diagnosis not present

## 2018-04-10 DIAGNOSIS — X58XXXA Exposure to other specified factors, initial encounter: Secondary | ICD-10-CM | POA: Diagnosis not present

## 2018-04-10 DIAGNOSIS — Z888 Allergy status to other drugs, medicaments and biological substances status: Secondary | ICD-10-CM | POA: Insufficient documentation

## 2018-04-10 DIAGNOSIS — S62306A Unspecified fracture of fifth metacarpal bone, right hand, initial encounter for closed fracture: Secondary | ICD-10-CM | POA: Diagnosis present

## 2018-04-10 DIAGNOSIS — F419 Anxiety disorder, unspecified: Secondary | ICD-10-CM | POA: Diagnosis not present

## 2018-04-10 HISTORY — PX: CLOSED REDUCTION FINGER WITH PERCUTANEOUS PINNING: SHX5612

## 2018-04-10 SURGERY — CLOSED REDUCTION, FINGER, WITH PERCUTANEOUS PINNING
Anesthesia: General | Site: Finger | Laterality: Right

## 2018-04-10 MED ORDER — BUPIVACAINE-EPINEPHRINE (PF) 0.5% -1:200000 IJ SOLN
INTRAMUSCULAR | Status: DC | PRN
  Administered 2018-04-10: 25 mL via PERINEURAL

## 2018-04-10 MED ORDER — FENTANYL CITRATE (PF) 100 MCG/2ML IJ SOLN
INTRAMUSCULAR | Status: AC
  Filled 2018-04-10: qty 2

## 2018-04-10 MED ORDER — ONDANSETRON HCL 4 MG/2ML IJ SOLN
INTRAMUSCULAR | Status: DC | PRN
  Administered 2018-04-10: 4 mg via INTRAVENOUS

## 2018-04-10 MED ORDER — CLINDAMYCIN PHOSPHATE 900 MG/50ML IV SOLN
900.0000 mg | INTRAVENOUS | Status: AC
  Administered 2018-04-10: 900 mg via INTRAVENOUS

## 2018-04-10 MED ORDER — OXYCODONE HCL 5 MG PO TABS: 5.0000 mg | ORAL_TABLET | Freq: Once | ORAL | Status: DC | PRN

## 2018-04-10 MED ORDER — HYDROCODONE-ACETAMINOPHEN 7.5-325 MG PO TABS: 1.0000 | ORAL_TABLET | Freq: Four times a day (QID) | ORAL | 0 refills | Status: DC | PRN

## 2018-04-10 MED ORDER — PROPOFOL 10 MG/ML IV BOLUS
INTRAVENOUS | Status: DC | PRN
  Administered 2018-04-10 (×3): 30 mg via INTRAVENOUS
  Administered 2018-04-10: 20 mg via INTRAVENOUS

## 2018-04-10 MED ORDER — LACTATED RINGERS IV SOLN
INTRAVENOUS | Status: DC
  Administered 2018-04-10 (×2): via INTRAVENOUS

## 2018-04-10 MED ORDER — CHLORHEXIDINE GLUCONATE 4 % EX LIQD: 60.0000 mL | Freq: Once | CUTANEOUS | Status: DC

## 2018-04-10 MED ORDER — PROMETHAZINE HCL 25 MG PO TABS: 25.0000 mg | ORAL_TABLET | Freq: Four times a day (QID) | ORAL | 1 refills | Status: DC | PRN

## 2018-04-10 MED ORDER — PROMETHAZINE HCL 25 MG/ML IJ SOLN: 6.2500 mg | INTRAMUSCULAR | Status: DC | PRN

## 2018-04-10 MED ORDER — FENTANYL CITRATE (PF) 100 MCG/2ML IJ SOLN
50.0000 ug | INTRAMUSCULAR | Status: DC | PRN
  Administered 2018-04-10: 50 ug via INTRAVENOUS
  Administered 2018-04-10: 100 ug via INTRAVENOUS

## 2018-04-10 MED ORDER — MIDAZOLAM HCL 2 MG/2ML IJ SOLN
INTRAMUSCULAR | Status: AC
  Filled 2018-04-10: qty 2

## 2018-04-10 MED ORDER — ACETAMINOPHEN 10 MG/ML IV SOLN: 1000.0000 mg | Freq: Once | INTRAVENOUS | Status: DC | PRN

## 2018-04-10 MED ORDER — SCOPOLAMINE 1 MG/3DAYS TD PT72: 1.0000 | MEDICATED_PATCH | Freq: Once | TRANSDERMAL | Status: DC | PRN

## 2018-04-10 MED ORDER — FENTANYL CITRATE (PF) 100 MCG/2ML IJ SOLN: 25.0000 ug | INTRAMUSCULAR | Status: DC | PRN

## 2018-04-10 MED ORDER — CLINDAMYCIN PHOSPHATE 900 MG/50ML IV SOLN
INTRAVENOUS | Status: AC
  Filled 2018-04-10: qty 50

## 2018-04-10 MED ORDER — LACTATED RINGERS IV SOLN
INTRAVENOUS | Status: DC
  Administered 2018-04-10: 12:00:00 via INTRAVENOUS

## 2018-04-10 MED ORDER — OXYCODONE HCL 5 MG/5ML PO SOLN: 5.0000 mg | Freq: Once | ORAL | Status: DC | PRN

## 2018-04-10 MED ORDER — MIDAZOLAM HCL 2 MG/2ML IJ SOLN
1.0000 mg | INTRAMUSCULAR | Status: DC | PRN
  Administered 2018-04-10: 2 mg via INTRAVENOUS
  Administered 2018-04-10: 1 mg via INTRAVENOUS

## 2018-04-10 SURGICAL SUPPLY — 52 items
BANDAGE ACE 3X5.8 VEL STRL LF (GAUZE/BANDAGES/DRESSINGS) ×3 IMPLANT
BLADE MINI RND TIP GREEN BEAV (BLADE) IMPLANT
BLADE SURG 15 STRL LF DISP TIS (BLADE) ×2 IMPLANT
BLADE SURG 15 STRL SS (BLADE) ×3
BNDG CMPR 9X4 STRL LF SNTH (GAUZE/BANDAGES/DRESSINGS) ×2
BNDG ESMARK 4X9 LF (GAUZE/BANDAGES/DRESSINGS) ×3 IMPLANT
BNDG PLASTER X FAST 3X3 WHT LF (CAST SUPPLIES) ×3 IMPLANT
BNDG PLSTR 9X3 FST ST WHT (CAST SUPPLIES) ×2
BRUSH SCRUB EZ PLAIN DRY (MISCELLANEOUS) ×6 IMPLANT
CANISTER SUCTION 1200CC (MISCELLANEOUS) ×3 IMPLANT
CORD BIPOLAR FORCEPS 12FT (ELECTRODE) ×3 IMPLANT
COVER BACK TABLE 60X90IN (DRAPES) ×3 IMPLANT
COVER MAYO STAND STRL (DRAPES) ×3 IMPLANT
COVER WAND RF STERILE (DRAPES) IMPLANT
CUFF TOURNIQUET SINGLE 18IN (TOURNIQUET CUFF) ×2 IMPLANT
DECANTER SPIKE VIAL GLASS SM (MISCELLANEOUS) IMPLANT
DRAPE EXTREMITY T 121X128X90 (DISPOSABLE) ×3 IMPLANT
DRAPE OEC MINIVIEW 54X84 (DRAPES) ×3 IMPLANT
DRAPE SURG 17X23 STRL (DRAPES) ×3 IMPLANT
GAUZE SPONGE 4X4 12PLY STRL (GAUZE/BANDAGES/DRESSINGS) ×3 IMPLANT
GAUZE XEROFORM 1X8 LF (GAUZE/BANDAGES/DRESSINGS) ×3 IMPLANT
GLOVE BIOGEL PI IND STRL 7.0 (GLOVE) ×2 IMPLANT
GLOVE BIOGEL PI INDICATOR 7.0 (GLOVE) ×1
GLOVE ECLIPSE 7.0 STRL STRAW (GLOVE) ×3 IMPLANT
GLOVE SKINSENSE NS SZ7.5 (GLOVE) ×1
GLOVE SKINSENSE STRL SZ7.5 (GLOVE) ×2 IMPLANT
GLOVE SURG SYN 7.5  E (GLOVE) ×1
GLOVE SURG SYN 7.5 E (GLOVE) ×2 IMPLANT
GLOVE SURG SYN 7.5 PF PI (GLOVE) ×1 IMPLANT
GOWN STRL REIN XL XLG (GOWN DISPOSABLE) ×3 IMPLANT
GOWN STRL REUS W/ TWL LRG LVL3 (GOWN DISPOSABLE) ×2 IMPLANT
GOWN STRL REUS W/ TWL XL LVL3 (GOWN DISPOSABLE) ×2 IMPLANT
GOWN STRL REUS W/TWL LRG LVL3 (GOWN DISPOSABLE) ×3
GOWN STRL REUS W/TWL XL LVL3 (GOWN DISPOSABLE) ×3
NEEDLE HYPO 22GX1.5 SAFETY (NEEDLE) IMPLANT
NS IRRIG 1000ML POUR BTL (IV SOLUTION) ×3 IMPLANT
PACK BASIN DAY SURGERY FS (CUSTOM PROCEDURE TRAY) ×3 IMPLANT
PAD CAST 3X4 CTTN HI CHSV (CAST SUPPLIES) ×2 IMPLANT
PADDING CAST COTTON 3X4 STRL (CAST SUPPLIES) ×3
RUBBERBAND STERILE (MISCELLANEOUS) ×3 IMPLANT
SLEEVE SCD COMPRESS KNEE MED (MISCELLANEOUS) ×3 IMPLANT
SPLINT FIBERGLASS 3X35 (CAST SUPPLIES) ×3 IMPLANT
STOCKINETTE 4X48 STRL (DRAPES) ×3 IMPLANT
SUCTION FRAZIER HANDLE 10FR (MISCELLANEOUS)
SUCTION TUBE FRAZIER 10FR DISP (MISCELLANEOUS) IMPLANT
SUT ETHILON 4 0 PS 2 18 (SUTURE) ×6 IMPLANT
SYR BULB 3OZ (MISCELLANEOUS) ×3 IMPLANT
SYR CONTROL 10ML LL (SYRINGE) IMPLANT
TOWEL GREEN STERILE FF (TOWEL DISPOSABLE) ×6 IMPLANT
TRAY DSU PREP LF (CUSTOM PROCEDURE TRAY) ×3 IMPLANT
TUBE CONNECTING 20X1/4 (TUBING) IMPLANT
UNDERPAD 30X30 (UNDERPADS AND DIAPERS) ×3 IMPLANT

## 2018-04-10 NOTE — Transfer of Care (Signed)
Immediate Anesthesia Transfer of Care Note  Patient: Erica [name]  Procedure(s) Performed: CLOSED REDUCTION FINGER WITH PERCUTANEOUS PINNING (Right Finger)  Patient Location: PACU  Anesthesia Type:MAC and MAC combined with regional for post-op pain  Level of Consciousness: awake, alert  and oriented  Airway & Oxygen Therapy: Patient Spontanous Breathing and Patient connected to face mask oxygen  Post-op Assessment: Report given to RN and Post -op Vital signs reviewed and stable  Post vital signs: Reviewed and stable  Last Vitals:  Vitals Value Taken Time  BP 93/58 04/10/2018 12:57 PM  Temp    Pulse 72 04/10/2018 12:59 PM  Resp 12 04/10/2018 12:59 PM  SpO2 98 % 04/10/2018 12:59 PM  Vitals shown include unvalidated device data.  Last Pain:  Vitals:   04/10/18 1117  TempSrc: Oral  PainSc: 8       Patients Stated Pain Goal: 3 (01/60/10 9323)  Complications: No apparent anesthesia complications

## 2018-04-10 NOTE — H&P (Signed)
PREOPERATIVE H&P  Chief Complaint: right 5th metacarpal neck fracture  HPI: Erica [name] is a 38 y.o. female who presents for surgical treatment of right 5th metacarpal neck fracture.  She denies any changes in medical history.  Past Medical History:  Diagnosis Date  . Anemia    with pregnancy  . Anxiety   . Cervical radiculitis   . Dysrhythmia    Irregular heart beat  associated with abnormal tachycardia  . Endometriosis   . Finger fracture, left 10/2004  . Hemangioma 03/12/2017   3.5 cm benign hemangioma in the posterior right hepatic lobe  . History of appendicitis   . Irregular heart beat   . Migraines   . OCD (obsessive compulsive disorder)   . Ovarian cyst    left, hemorrhagic (1) right side (2)  . PONV (postoperative nausea and vomiting)   . Sciatica   . Umbilical hernia   . Wears glasses    Past Surgical History:  Procedure Laterality Date  . APPENDECTOMY    . ARTERY BIOPSY Left 10/25/2017   Procedure: BIOPSY TEMPORAL ARTERY LEFT;  Surgeon: Kieth Brightly, Arta Bruce, MD;  Location: [facility];  Service: General;  Laterality: Left;  . DIAGNOSTIC LAPAROSCOPY    . DILATION AND CURETTAGE OF UTERUS    . LAPAROSCOPIC TUBAL LIGATION Bilateral 12/12/2017   Procedure: LAPAROSCOPIC BILATERAL TUBAL LIGATION PARTIAL SALPINGECTOMY;  Surgeon: Florian Buff, MD;  Location: [facility];  Service: Gynecology;  Laterality: Bilateral;  . spinal tap     several  . TONSILLECTOMY AND ADENOIDECTOMY Bilateral 06/13/2016   Procedure: TONSILLECTOMY AND ADENOIDECTOMY;  Surgeon: Leta Baptist, MD;  Location: [facility];  Service: ENT;  Laterality: Bilateral;  . WISDOM TOOTH EXTRACTION     Social History   Socioeconomic History  . Marital status: Divorced    Spouse name: Not on file  . Number of children: Not on file  . Years of education: Not on file  . Highest education level: Not on file  Occupational History  . Not on file  Social Needs  . Financial  resource strain: Not on file  . Food insecurity:    Worry: Not on file    Inability: Not on file  . Transportation needs:    Medical: Not on file    Non-medical: Not on file  Tobacco Use  . Smoking status: Former Smoker    Packs/day: 1.00    Years: 10.00    Pack years: 10.00    Types: Cigarettes    Last attempt to quit: 01/15/2013    Years since quitting: 5.2  . Smokeless tobacco: Never Used  . Tobacco comment: Quit smoking almost 2 years ago  Substance and Sexual Activity  . Alcohol use: Yes    Alcohol/week: 0.0 standard drinks    Comment: occassional  . Drug use: No  . Sexual activity: Yes    Birth control/protection: Surgical  Lifestyle  . Physical activity:    Days per week: Not on file    Minutes per session: Not on file  . Stress: Not on file  Relationships  . Social connections:    Talks on phone: Not on file    Gets together: Not on file    Attends religious service: Not on file    Active member of club or organization: Not on file    Attends meetings of clubs or organizations: Not on file    Relationship status: Not on file  Other Topics Concern  .  Not on file  Social History Narrative   Lives with fiancee and 2 children in a one story home.  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  Education: currently in 58rd year of college.   Family History  Problem Relation Age of Onset  . Cancer Maternal Grandmother 21       ovarian, and uterine  . Cancer Mother 30       ovarian and Breast  . Bipolar disorder Mother   . Bipolar disorder Sister   . Depression Brother   . Healthy Son        x 2   Allergies  Allergen Reactions  . Amoxicillin Anaphylaxis    Has patient had a PCN reaction causing immediate rash, facial/tongue/throat swelling, SOB or lightheadedness with hypotension: Yes Has patient had a PCN reaction causing severe rash involving mucus membranes or skin necrosis: No Has patient had a PCN reaction that required hospitalization: Yes Has patient had a  PCN reaction occurring within the last 10 years: Yes If all of the above answers are "NO", then may proceed with Cephalosporin use.   . Effexor [Venlafaxine] Hives  . Other Hives    Poppy Seeds   Prior to Admission medications   Medication Sig Start Date End Date Taking? Authorizing Provider  Magnesium Oxide 500 MG TABS Take 500 mg by mouth at bedtime.   Yes [provider]  Multiple Vitamin (MULTIVITAMIN) tablet Take 1 tablet by mouth at bedtime.    Yes [provider]  rizatriptan (MAXALT) 10 MG tablet TAKE ONE TABLET BY MOUTH AS NEEDED FOR MIGRAINE. MAY REPEAT IN 2 HOURS IF NEEDED. 08/29/17  Yes Patel, Donika K, DO  sertraline (ZOLOFT) 100 MG tablet Take 2 tablets (200 mg total) by mouth daily. (Needs to be seen before next refill) Patient taking differently: Take 200 mg by mouth at bedtime. (Needs to be seen before next refill) 11/23/17  Yes Stacks, Cletus Gash, MD  topiramate (TOPAMAX) 50 MG tablet Take 1.5 tablets daily Patient taking differently: Take 75 mg by mouth at bedtime.  08/29/17  Yes Patel, Donika K, DO  buPROPion (WELLBUTRIN XL) 150 MG 24 hr tablet Take 1 tablet (150 mg total) by mouth daily. 03/25/18   Norman Clay, MD  cyclobenzaprine (FLEXERIL) 10 MG tablet Take 1 tablet (10 mg total) by mouth at bedtime as needed for muscle spasms. Patient taking differently: Take 10 mg by mouth at bedtime.  08/29/17   Narda Amber K, DO  HYDROcodone-acetaminophen (NORCO/VICODIN) 5-325 MG tablet Take 1 tablet by mouth every 4 (four) hours as needed. 04/06/18   Lily Kocher, PA-C  sertraline (ZOLOFT) 100 MG tablet Take 2 tablets (200 mg total) by mouth daily. 03/22/18   Norman Clay, MD     Positive ROS: All other systems have been reviewed and were otherwise negative with the exception of those mentioned in the HPI and as above.  Physical Exam: General: Alert, no acute distress Cardiovascular: No pedal edema Respiratory: No cyanosis, no use of accessory musculature GI:  abdomen soft Skin: No lesions in the area of chief complaint Neurologic: Sensation intact distally Psychiatric: Patient is competent for consent with normal mood and affect Lymphatic: no lymphedema  MUSCULOSKELETAL: exam stable  Assessment: right 5th metacarpal neck fracture  Plan: Plan for Procedure(s): CLOSED VS. OPEN REDUCTION, PINNING RIGHT 5TH METACARPAL  The risks benefits and alternatives were discussed with the patient including but not limited to the risks of nonoperative treatment, versus surgical intervention including infection, bleeding, nerve  injury,  blood clots, cardiopulmonary complications, morbidity, mortality, among others, and they were willing to proceed.   Eduard Roux, MD   04/10/2018 7:33 AM

## 2018-04-10 NOTE — Anesthesia Procedure Notes (Signed)
Anesthesia Regional Block: Supraclavicular block   Pre-Anesthetic Checklist: ,, timeout performed, Correct Patient, Correct Site, Correct Laterality, Correct Procedure, Correct Position, site marked, Risks and benefits discussed, pre-op evaluation,  At surgeon's request and post-op pain management  Laterality: Right  Prep: Maximum Sterile Barrier Precautions used, chloraprep       Needles:  Injection technique: Single-shot  Needle Type: Echogenic Stimulator Needle     Needle Length: 9cm  Needle Gauge: 22     Additional Needles:   Procedures:,,,, ultrasound used (permanent image in chart),,,,  Narrative:  Start time: 04/10/2018 11:28 AM End time: 04/10/2018 11:30 AM Injection made incrementally with aspirations every 5 mL.  Performed by: Personally  Anesthesiologist: Brennan Bailey, MD  Additional Notes: Risks, benefits, and alternative discussed. Patient gave consent for procedure. Patient prepped and draped in sterile fashion. Sedation administered, patient remains easily responsive to voice. Relevant anatomy identified with ultrasound guidance. Local anesthetic given in 5cc increments with no signs or symptoms of intravascular injection. No pain or paraesthesias with injection. Patient monitored throughout procedure with signs of LAST or immediate complications. Tolerated well. Ultrasound image placed in chart.  Tawny Asal, MD

## 2018-04-10 NOTE — Anesthesia Postprocedure Evaluation (Signed)
Anesthesia Post Note  Patient: LYNIAH FUJITA  Procedure(s) Performed: CLOSED REDUCTION FINGER WITH PERCUTANEOUS PINNING (Right Finger)     Patient location during evaluation: PACU Anesthesia Type: Regional and MAC Level of consciousness: awake and alert Pain management: pain level controlled Vital Signs Assessment: post-procedure vital signs reviewed and stable Respiratory status: spontaneous breathing, nonlabored ventilation and respiratory function stable Cardiovascular status: blood pressure returned to baseline and stable Postop Assessment: no apparent nausea or vomiting Anesthetic complications: no    Last Vitals:  Vitals:   04/10/18 1259 04/10/18 1300  BP:  111/67  Pulse:  62  Resp:  12  Temp: 36.6 C   SpO2:  99%    Last Pain:  Vitals:   04/10/18 1259  TempSrc:   PainSc: 0-No pain                 Brennan Bailey

## 2018-04-10 NOTE — Discharge Instructions (Signed)
Postoperative instructions:  Weightbearing instructions: non weight bearing  Dressing instructions: Keep your dressing and/or splint clean and dry at all times.  It will be removed at your first post-operative appointment.  Your stitches and/or staples will be removed at this visit.  Incision instructions:  Do not soak your incision for 3 weeks after surgery.  If the incision gets wet, pat dry and do not scrub the incision.  Pain control:  You have been given a prescription to be taken as directed for post-operative pain control.  In addition, elevate the operative extremity above the heart at all times to prevent swelling and throbbing pain.  Take over-the-counter Colace, 100mg  by mouth twice a day while taking narcotic pain medications to help prevent constipation.  Follow up appointments: 1) 12-14 days for suture removal and wound check. 2) Dr. Erlinda Hong as scheduled.   -------------------------------------------------------------------------------------------------------------  After Surgery Pain Control:  After your surgery, post-surgical discomfort or pain is likely. This discomfort can last several days to a few weeks. At certain times of the day your discomfort may be more intense.  Did you receive a nerve block?  A nerve block can provide pain relief for one hour to two days after your surgery. As long as the nerve block is working, you will experience little or no sensation in the area the surgeon operated on.  As the nerve block wears off, you will begin to experience pain or discomfort. It is very important that you begin taking your prescribed pain medication before the nerve block fully wears off. Treating your pain at the first sign of the block wearing off will ensure your pain is better controlled and more tolerable when full-sensation returns. Do not wait until the pain is intolerable, as the medicine will be less effective. It is better to treat pain in advance than to try and  catch up.  General Anesthesia:  If you did not receive a nerve block during your surgery, you will need to start taking your pain medication shortly after your surgery and should continue to do so as prescribed by your surgeon.  Pain Medication:  Most commonly we prescribe Vicodin and Percocet for post-operative pain. Both of these medications contain a combination of acetaminophen (Tylenol) and a narcotic to help control pain.   It takes between 30 and 45 minutes before pain medication starts to work. It is important to take your medication before your pain level gets too intense.   Nausea is a common side effect of many pain medications. You will want to eat something before taking your pain medicine to help prevent nausea.   If you are taking a prescription pain medication that contains acetaminophen, we recommend that you do not take additional over the counter acetaminophen (Tylenol).  Other pain relieving options:   Using a cold pack to ice the affected area a few times a day (15 to 20 minutes at a time) can help to relieve pain, reduce swelling and bruising.   Elevation of the affected area can also help to reduce pain and swelling.          Post Anesthesia Home Care Instructions  Activity: Get plenty of rest for the remainder of the day. A responsible individual must stay with you for 24 hours following the procedure.  For the next 24 hours, DO NOT: -Drive a car -Paediatric nurse -Drink alcoholic beverages -Take any medication unless instructed by your physician -Make any legal decisions or sign important papers.  Meals: Start with  liquid foods such as gelatin or soup. Progress to regular foods as tolerated. Avoid greasy, spicy, heavy foods. If nausea and/or vomiting occur, drink only clear liquids until the nausea and/or vomiting subsides. Call your physician if vomiting continues.  Special Instructions/Symptoms: Your throat may feel dry or sore from the anesthesia  or the breathing tube placed in your throat during surgery. If this causes discomfort, gargle with warm salt water. The discomfort should disappear within 24 hours.  If you had a scopolamine patch placed behind your ear for the management of post- operative nausea and/or vomiting:  1. The medication in the patch is effective for 72 hours, after which it should be removed.  Wrap patch in a tissue and discard in the trash. Wash hands thoroughly with soap and water. 2. You may remove the patch earlier than 72 hours if you experience unpleasant side effects which may include dry mouth, dizziness or visual disturbances. 3. Avoid touching the patch. Wash your hands with soap and water after contact with the patch.       Regional Anesthesia Blocks  1. Numbness or the inability to move the "blocked" extremity may last from 3-48 hours after placement. The length of time depends on the medication injected and your individual response to the medication. If the numbness is not going away after 48 hours, call your surgeon.  2. The extremity that is blocked will need to be protected until the numbness is gone and the  Strength has returned. Because you cannot feel it, you will need to take extra care to avoid injury. Because it may be weak, you may have difficulty moving it or using it. You may not know what position it is in without looking at it while the block is in effect.  3. For blocks in the legs and feet, returning to weight bearing and walking needs to be done carefully. You will need to wait until the numbness is entirely gone and the strength has returned. You should be able to move your leg and foot normally before you try and bear weight or walk. You will need someone to be with you when you first try to ensure you do not fall and possibly risk injury.  4. Bruising and tenderness at the needle site are common side effects and will resolve in a few days.  5. Persistent numbness or new problems  with movement should be communicated to the surgeon or the [place] [facility] [phone] [place] [phone]).

## 2018-04-10 NOTE — Progress Notes (Signed)
Assisted Dr. Daiva Huge with right, ultrasound guided, supraclavicular block. Side rails up, monitors on throughout procedure. See vital signs in flow sheet. Tolerated Procedure well.

## 2018-04-10 NOTE — Op Note (Signed)
   Date of Surgery: 04/10/2018  INDICATIONS: Erica [name] is a 38 y.o.-year-old female with a right hand fracture;  The patient did consent to the procedure after discussion of the risks and benefits.  PREOPERATIVE DIAGNOSIS: Right displaced 5th metacarpal fracture  POSTOPERATIVE DIAGNOSIS: Same.  PROCEDURE:  Closed reduction percutaneous pinning of right 5th metacarpal fracture, CPT 26608  SURGEON: N. Eduard Roux, M.D.  ASSIST: Ciro Backer [place], [place]; necessary for the timely completion of procedure and due to complexity of procedure.  ANESTHESIA:  Regional block, MAC  IV FLUIDS AND URINE: See anesthesia.  ESTIMATED BLOOD LOSS: minimal mL.  IMPLANTS: 0.045 K wires x 2  DRAINS: none  COMPLICATIONS: None.  DESCRIPTION OF PROCEDURE: The patient was brought to the operating room and placed supine on the operating table.  The patient had been signed prior to the procedure and this was documented. The patient had the anesthesia placed by the anesthesiologist.  A time-out was performed to confirm that this was the correct patient, site, side and location. The patient did receive antibiotics prior to the incision and was re-dosed during the procedure as needed at indicated intervals.  A tourniquet was placed.  The patient had the operative extremity prepped and draped in the standard surgical fashion.    Close reduction maneuver was performed on the fifth metacarpal which brought the fracture back into alignment which was then confirmed under mini fluoroscopy.  With the fracture reduced I then placed a 0.045 K wire in a retrograde fashion through the metacarpal head on the axis of the fifth metacarpal.  I then placed a second K wire from the radial aspect of the fifth metacarpal head across the fracture in a perpendicular fashion that was brought out on the ulnar and proximal aspect of the fifth metacarpal.  Mini fluoroscopy was used to confirm appropriate reduction of the fracture and placement  of hardware.  The pins were then cut short and capped.  The hand was placed in a dorsal blocking splint with the MCP joints at 90 degrees.  Patient tolerated procedure well had no immediate complications.  POSTOPERATIVE PLAN: She will follow-up in 2 weeks for x-rays of the right hand.  [name], MD [place] 7:33 AM

## 2018-04-10 NOTE — Anesthesia Preprocedure Evaluation (Addendum)
Anesthesia Evaluation  Patient identified by MRN, date of birth, ID band Patient awake    Reviewed: Allergy & Precautions, NPO status , Patient's Chart, lab work & pertinent test results  History of Anesthesia Complications (+) PONV  Airway Mallampati: II  TM Distance: >3 FB Neck ROM: Full    Dental  (+) Teeth Intact, Dental Advisory Given   Pulmonary former smoker,    Pulmonary exam normal breath sounds clear to auscultation       Cardiovascular negative cardio ROS Normal cardiovascular exam Rhythm:Regular Rate:Normal     Neuro/Psych  Headaches, Anxiety Depression Cervical radiculopathy    GI/Hepatic negative GI ROS, Neg liver ROS,   Endo/Other  negative endocrine ROS  Renal/GU negative Renal ROS     Musculoskeletal negative musculoskeletal ROS (+)   Abdominal   Peds  Hematology negative hematology ROS (+)   Anesthesia Other Findings Day of surgery medications reviewed with the patient.  Reproductive/Obstetrics                            Anesthesia Physical Anesthesia Plan  ASA: II  Anesthesia Plan: MAC and Regional   Post-op Pain Management:    Induction:   PONV Risk Score and Plan: 3 and Treatment may vary due to age or medical condition, Ondansetron, Dexamethasone and Midazolam  Airway Management Planned: Natural Airway and Simple Face Mask  Additional Equipment:   Intra-op Plan:   Post-operative Plan:   Informed Consent: I have reviewed the patients History and Physical, chart, labs and discussed the procedure including the risks, benefits and alternatives for the proposed anesthesia with the patient or authorized representative who has indicated his/her understanding and acceptance.     Dental advisory given  Plan Discussed with: CRNA  Anesthesia Plan Comments:       Anesthesia Quick Evaluation

## 2018-04-11 ENCOUNTER — Encounter (HOSPITAL_BASED_OUTPATIENT_CLINIC_OR_DEPARTMENT_OTHER): Payer: Self-pay | Admitting: Orthopaedic Surgery
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
## 2018-04-22 ENCOUNTER — Ambulatory Visit (HOSPITAL_COMMUNITY): Payer: Self-pay | Admitting: Psychiatry

## 2018-04-23 ENCOUNTER — Encounter (HOSPITAL_COMMUNITY): Payer: Self-pay | Admitting: Psychiatry

## 2018-04-23 ENCOUNTER — Ambulatory Visit (HOSPITAL_COMMUNITY): Payer: Self-pay | Admitting: Psychiatry

## 2018-04-23 ENCOUNTER — Ambulatory Visit (INDEPENDENT_AMBULATORY_CARE_PROVIDER_SITE_OTHER): Payer: Medicaid Other | Admitting: Psychiatry

## 2018-04-23 VITALS — BP 128/85 | HR 98 | Ht 69.0 in | Wt 210.0 lb

## 2018-04-23 DIAGNOSIS — F331 Major depressive disorder, recurrent, moderate: Secondary | ICD-10-CM | POA: Diagnosis not present

## 2018-04-23 DIAGNOSIS — F4310 Post-traumatic stress disorder, unspecified: Secondary | ICD-10-CM | POA: Diagnosis not present

## 2018-04-23 MED ORDER — SERTRALINE HCL 100 MG PO TABS: 200.0000 mg | ORAL_TABLET | Freq: Every day | ORAL | 0 refills | Status: DC

## 2018-04-23 MED ORDER — BUPROPION HCL ER (XL) 300 MG PO TB24: 300.0000 mg | ORAL_TABLET | Freq: Every day | ORAL | 1 refills | Status: DC

## 2018-04-23 NOTE — Patient Instructions (Signed)
1. Continue Sertraline 200 mg daily  2. Increase bupropion 300 mg daily   3. Return to clinic in two months for 15 mins

## 2018-04-25 ENCOUNTER — Encounter (INDEPENDENT_AMBULATORY_CARE_PROVIDER_SITE_OTHER): Payer: Self-pay | Admitting: Orthopaedic Surgery

## 2018-04-25 ENCOUNTER — Ambulatory Visit (INDEPENDENT_AMBULATORY_CARE_PROVIDER_SITE_OTHER): Payer: Medicaid Other | Admitting: Orthopaedic Surgery

## 2018-04-25 ENCOUNTER — Ambulatory Visit (INDEPENDENT_AMBULATORY_CARE_PROVIDER_SITE_OTHER): Payer: Medicaid Other

## 2018-04-25 DIAGNOSIS — M79641 Pain in right hand: Secondary | ICD-10-CM | POA: Diagnosis not present

## 2018-04-25 DIAGNOSIS — S62336D Displaced fracture of neck of fifth metacarpal bone, right hand, subsequent encounter for fracture with routine healing: Secondary | ICD-10-CM

## 2018-04-25 NOTE — Progress Notes (Signed)
Post-Op Visit Note   Patient: Erica [name]           Date of Birth: 05/01/1980           MRN: [id number] Visit Date: 04/25/2018 PCP: Claretta Fraise, MD   Assessment & Plan:  Chief Complaint:  Chief Complaint  Patient presents with  . Right Hand - Routine Post Op   Visit Diagnoses:  1. Closed displaced fracture of neck of fifth metacarpal bone of right hand with routine healing, subsequent encounter   2. Pain in right hand     Plan: Erica [name] is two-week status post percutaneous pinning of her fifth metacarpal fracture.  She was seen by her school resource nurse because she felt like her hand was burning and so the nurse took off the entire surgical bandage but unfortunately was unaware of the pin caps so these were also removed.  She denies any constitutional symptoms.  She endorses mild to moderate pain that is worse with movement of the fingers.  Her pin sites are clean dry intact.  The radial pin has migrated deep into the subcutaneous tissue.  There is no evidence of infection.  X-rays show 2 retained pins with the alignment stable.  Unfortunately the radial pin has migrated underneath the skin and would require surgical removal that is not appropriate in the office.  We discussed that if her pain is not too bad from the pins we could wait 2 more weeks until the pins will come out anyways.  We will plan on doing this in the operating room.  She is agreeable to this.  We put her in a well-padded cast today.  We instructed her to call or see [place] anytime with any concerns or questions.   Follow-Up Instructions: Return in about 2 weeks (around 05/09/2018).   Orders:  Orders Placed This Encounter  Procedures  . XR Hand Complete Right   No orders of the defined types were placed in this encounter.   Imaging: Xr Hand Complete Right  Result Date: 04/25/2018 Stable alignment of 5th metacarpal fracture with 2 retained pins.   PMFS History: Patient Active Problem List   Diagnosis  Date Noted  . Closed displaced fracture of neck of right fifth metacarpal bone 04/09/2018  . MDD (major depressive disorder), recurrent episode, moderate ([place]) 03/22/2018  . Encounter for Nexplanon removal 10/17/2017  . Migraine without aura and without status migrainosus, not intractable 10/12/2016  . Nexplanon insertion 03/23/2016  . Cervical disc disorder with radiculopathy of cervical region 09/24/2014  . GAD (generalized anxiety disorder) 05/02/2013  . OCD (obsessive compulsive disorder) 05/02/2013  . Irregular heart beat    Past Medical History:  Diagnosis Date  . Anemia    with pregnancy  . Anxiety   . Cervical radiculitis   . Dysrhythmia    Irregular heart beat  associated with abnormal tachycardia  . Endometriosis   . Finger fracture, left 10/2004  . Hemangioma 03/12/2017   3.5 cm benign hemangioma in the posterior right hepatic lobe  . History of appendicitis   . Irregular heart beat   . Migraines   . OCD (obsessive compulsive disorder)   . Ovarian cyst    left, hemorrhagic (1) right side (2)  . PONV (postoperative nausea and vomiting)   . Sciatica   . Umbilical hernia   . Wears glasses     Family History  Problem Relation Age of Onset  . Cancer Maternal Grandmother 6  ovarian, and uterine  . Cancer Mother 30       ovarian and Breast  . Bipolar disorder Mother   . Bipolar disorder Sister   . Depression Brother   . Healthy Son        x 2    Past Surgical History:  Procedure Laterality Date  . APPENDECTOMY    . ARTERY BIOPSY Left 10/25/2017   Procedure: BIOPSY TEMPORAL ARTERY LEFT;  Surgeon: Kieth Brightly, Arta Bruce, MD;  Location: [facility];  Service: General;  Laterality: Left;  . CLOSED REDUCTION FINGER WITH PERCUTANEOUS PINNING Right 04/10/2018   Procedure: CLOSED REDUCTION FINGER WITH PERCUTANEOUS PINNING;  Surgeon: Leandrew Koyanagi, MD;  Location: [facility];  Service: Orthopedics;  Laterality: Right;  . DIAGNOSTIC  LAPAROSCOPY    . DILATION AND CURETTAGE OF UTERUS    . LAPAROSCOPIC TUBAL LIGATION Bilateral 12/12/2017   Procedure: LAPAROSCOPIC BILATERAL TUBAL LIGATION PARTIAL SALPINGECTOMY;  Surgeon: Florian Buff, MD;  Location: [facility];  Service: Gynecology;  Laterality: Bilateral;  . spinal tap     several  . TONSILLECTOMY AND ADENOIDECTOMY Bilateral 06/13/2016   Procedure: TONSILLECTOMY AND ADENOIDECTOMY;  Surgeon: Leta Baptist, MD;  Location: [facility];  Service: ENT;  Laterality: Bilateral;  . WISDOM TOOTH EXTRACTION     Social History   Occupational History  . Not on file  Tobacco Use  . Smoking status: Former Smoker    Packs/day: 1.00    Years: 10.00    Pack years: 10.00    Types: Cigarettes    Last attempt to quit: 01/15/2013    Years since quitting: 5.2  . Smokeless tobacco: Never Used  . Tobacco comment: Quit smoking almost 2 years ago  Substance and Sexual Activity  . Alcohol use: Yes    Alcohol/week: 0.0 standard drinks    Comment: occassional  . Drug use: No  . Sexual activity: Yes    Birth control/protection: Surgical

## 2018-05-02 ENCOUNTER — Telehealth (INDEPENDENT_AMBULATORY_CARE_PROVIDER_SITE_OTHER): Payer: Self-pay

## 2018-05-02 ENCOUNTER — Other Ambulatory Visit: Payer: Self-pay

## 2018-05-02 NOTE — Telephone Encounter (Signed)
Patient called and left voicemail on Triage phone and states she has pins in her hand. She has a cast on. Woke up with radiating with sharp pains on side of hand and goes up neck. Would like to know what she needs to do. Scheduled SU for 05/08/2018.   [phone]

## 2018-05-02 NOTE — Telephone Encounter (Signed)
Called patient to advise  °

## 2018-05-02 NOTE — Telephone Encounter (Signed)
It's likely irritating a nerve.  Should go away with some time.

## 2018-05-06 ENCOUNTER — Telehealth (INDEPENDENT_AMBULATORY_CARE_PROVIDER_SITE_OTHER): Payer: Self-pay | Admitting: Orthopaedic Surgery

## 2018-05-06 NOTE — Telephone Encounter (Signed)
Pt called saying she called this morning around 2:00 AM to the on call Dr. Edison Simon she is in extreme pain.  The on call Dr. Durward Fortes tylenol and ibuprofen  and she said it isn't doing anything for the pain that she is in.

## 2018-05-06 NOTE — Telephone Encounter (Signed)
Please advise 

## 2018-05-07 ENCOUNTER — Ambulatory Visit (INDEPENDENT_AMBULATORY_CARE_PROVIDER_SITE_OTHER): Payer: Medicaid Other

## 2018-05-07 ENCOUNTER — Encounter (INDEPENDENT_AMBULATORY_CARE_PROVIDER_SITE_OTHER): Payer: Self-pay

## 2018-05-07 DIAGNOSIS — S62336D Displaced fracture of neck of fifth metacarpal bone, right hand, subsequent encounter for fracture with routine healing: Secondary | ICD-10-CM | POA: Diagnosis not present

## 2018-05-07 MED ORDER — SULFAMETHOXAZOLE-TRIMETHOPRIM 800-160 MG PO TABS: 1.0000 | ORAL_TABLET | Freq: Two times a day (BID) | ORAL | 0 refills | Status: DC

## 2018-05-07 NOTE — Progress Notes (Signed)
Patient comes in today with increased pain in right hand. We removed her cast and cleaned site. No signs of infection. Hand and fingers WWP Cap. Refill less than 2 sec. Bactrim was prescribed today. Plan for surgery tomorrow as scheduled.

## 2018-05-07 NOTE — Anesthesia Preprocedure Evaluation (Addendum)
Anesthesia Evaluation  Patient identified by MRN, date of birth, ID band Patient awake    Reviewed: Allergy & Precautions, NPO status , Patient's Chart, lab work & pertinent test results  History of Anesthesia Complications (+) PONV and history of anesthetic complications  Airway Mallampati: II  TM Distance: >3 FB Neck ROM: Full    Dental no notable dental hx. (+) Teeth Intact   Pulmonary former smoker,    Pulmonary exam normal breath sounds clear to auscultation       Cardiovascular negative cardio ROS Normal cardiovascular exam+ dysrhythmias  Rhythm:Regular Rate:Normal     Neuro/Psych  Headaches, PSYCHIATRIC DISORDERS Anxiety Depression  Neuromuscular disease    GI/Hepatic negative GI ROS, Neg liver ROS,   Endo/Other  Obesity  Renal/GU negative Renal ROS  negative genitourinary   Musculoskeletal Retained hardware right hand   Abdominal   Peds  Hematology  (+) anemia ,   Anesthesia Other Findings   Reproductive/Obstetrics                            Anesthesia Physical Anesthesia Plan  ASA: II  Anesthesia Plan: MAC   Post-op Pain Management:    Induction: Intravenous  PONV Risk Score and Plan: 3 and Ondansetron, Propofol infusion, Midazolam and Treatment may vary due to age or medical condition  Airway Management Planned: Natural Airway, Nasal Cannula and Simple Face Mask  Additional Equipment:   Intra-op Plan:   Post-operative Plan:   Informed Consent: I have reviewed the patients History and Physical, chart, labs and discussed the procedure including the risks, benefits and alternatives for the proposed anesthesia with the patient or authorized representative who has indicated his/her understanding and acceptance.     Dental advisory given  Plan Discussed with: CRNA and Surgeon  Anesthesia Plan Comments:       Anesthesia Quick Evaluation

## 2018-05-08 ENCOUNTER — Telehealth (INDEPENDENT_AMBULATORY_CARE_PROVIDER_SITE_OTHER): Payer: Self-pay

## 2018-05-08 ENCOUNTER — Ambulatory Visit (HOSPITAL_BASED_OUTPATIENT_CLINIC_OR_DEPARTMENT_OTHER): Payer: Medicaid Other | Admitting: Anesthesiology

## 2018-05-08 ENCOUNTER — Other Ambulatory Visit (INDEPENDENT_AMBULATORY_CARE_PROVIDER_SITE_OTHER): Payer: Self-pay

## 2018-05-08 ENCOUNTER — Other Ambulatory Visit: Payer: Self-pay

## 2018-05-08 ENCOUNTER — Ambulatory Visit (HOSPITAL_BASED_OUTPATIENT_CLINIC_OR_DEPARTMENT_OTHER)
Admission: RE | Admit: 2018-05-08 | Discharge: 2018-05-08 | Disposition: A | Discharge status: Home / Self Care | Payer: Medicaid Other | Attending: Orthopaedic Surgery | Admitting: Orthopaedic Surgery

## 2018-05-08 ENCOUNTER — Encounter (HOSPITAL_BASED_OUTPATIENT_CLINIC_OR_DEPARTMENT_OTHER)
Admission: RE | Disposition: A | Discharge status: Home / Self Care | Payer: Self-pay | Source: Home / Self Care | Attending: Orthopaedic Surgery

## 2018-05-08 ENCOUNTER — Encounter (HOSPITAL_BASED_OUTPATIENT_CLINIC_OR_DEPARTMENT_OTHER): Payer: Self-pay | Admitting: *Deleted

## 2018-05-08 DIAGNOSIS — G43909 Migraine, unspecified, not intractable, without status migrainosus: Secondary | ICD-10-CM | POA: Insufficient documentation

## 2018-05-08 DIAGNOSIS — S62336D Displaced fracture of neck of fifth metacarpal bone, right hand, subsequent encounter for fracture with routine healing: Secondary | ICD-10-CM

## 2018-05-08 DIAGNOSIS — F419 Anxiety disorder, unspecified: Secondary | ICD-10-CM | POA: Insufficient documentation

## 2018-05-08 DIAGNOSIS — Z79899 Other long term (current) drug therapy: Secondary | ICD-10-CM | POA: Insufficient documentation

## 2018-05-08 DIAGNOSIS — Z87891 Personal history of nicotine dependence: Secondary | ICD-10-CM | POA: Diagnosis not present

## 2018-05-08 DIAGNOSIS — E669 Obesity, unspecified: Secondary | ICD-10-CM | POA: Insufficient documentation

## 2018-05-08 DIAGNOSIS — Y831 Surgical operation with implant of artificial internal device as the cause of abnormal reaction of the patient, or of later complication, without mention of misadventure at the time of the procedure: Secondary | ICD-10-CM | POA: Diagnosis not present

## 2018-05-08 DIAGNOSIS — Z6831 Body mass index (BMI) 31.0-31.9, adult: Secondary | ICD-10-CM | POA: Diagnosis not present

## 2018-05-08 DIAGNOSIS — T8489XA Other specified complication of internal orthopedic prosthetic devices, implants and grafts, initial encounter: Secondary | ICD-10-CM | POA: Insufficient documentation

## 2018-05-08 DIAGNOSIS — S62336A Displaced fracture of neck of fifth metacarpal bone, right hand, initial encounter for closed fracture: Secondary | ICD-10-CM | POA: Diagnosis present

## 2018-05-08 DIAGNOSIS — F329 Major depressive disorder, single episode, unspecified: Secondary | ICD-10-CM | POA: Diagnosis not present

## 2018-05-08 HISTORY — PX: HARDWARE REMOVAL: SHX979

## 2018-05-08 SURGERY — REMOVAL, HARDWARE
Anesthesia: Monitor Anesthesia Care | Site: Hand | Laterality: Right

## 2018-05-08 MED ORDER — METOCLOPRAMIDE HCL 5 MG/ML IJ SOLN: 10.0000 mg | Freq: Once | INTRAMUSCULAR | Status: DC | PRN

## 2018-05-08 MED ORDER — FENTANYL CITRATE (PF) 100 MCG/2ML IJ SOLN
INTRAMUSCULAR | Status: AC
  Filled 2018-05-08: qty 2

## 2018-05-08 MED ORDER — LIDOCAINE 2% (20 MG/ML) 5 ML SYRINGE
INTRAMUSCULAR | Status: DC | PRN
  Administered 2018-05-08: 60 mg via INTRAVENOUS

## 2018-05-08 MED ORDER — SCOPOLAMINE 1 MG/3DAYS TD PT72: 1.0000 | MEDICATED_PATCH | Freq: Once | TRANSDERMAL | Status: DC | PRN

## 2018-05-08 MED ORDER — KETOROLAC TROMETHAMINE 30 MG/ML IJ SOLN
INTRAMUSCULAR | Status: AC
  Filled 2018-05-08: qty 1

## 2018-05-08 MED ORDER — MIDAZOLAM HCL 2 MG/2ML IJ SOLN: 1.0000 mg | INTRAMUSCULAR | Status: DC | PRN

## 2018-05-08 MED ORDER — HYDROCODONE-ACETAMINOPHEN 5-325 MG PO TABS
ORAL_TABLET | ORAL | Status: AC
  Filled 2018-05-08: qty 1

## 2018-05-08 MED ORDER — FENTANYL CITRATE (PF) 100 MCG/2ML IJ SOLN
INTRAMUSCULAR | Status: DC | PRN
  Administered 2018-05-08: 50 ug via INTRAVENOUS

## 2018-05-08 MED ORDER — LIDOCAINE HCL (PF) 1 % IJ SOLN
INTRAMUSCULAR | Status: AC
  Filled 2018-05-08: qty 30

## 2018-05-08 MED ORDER — KETOROLAC TROMETHAMINE 30 MG/ML IJ SOLN
INTRAMUSCULAR | Status: DC | PRN
  Administered 2018-05-08: 30 mg via INTRAVENOUS

## 2018-05-08 MED ORDER — CLINDAMYCIN PHOSPHATE 900 MG/50ML IV SOLN
INTRAVENOUS | Status: AC
  Filled 2018-05-08: qty 50

## 2018-05-08 MED ORDER — CLINDAMYCIN PHOSPHATE 900 MG/50ML IV SOLN: 900.0000 mg | INTRAVENOUS | Status: DC

## 2018-05-08 MED ORDER — FENTANYL CITRATE (PF) 100 MCG/2ML IJ SOLN
25.0000 ug | INTRAMUSCULAR | Status: DC | PRN
  Administered 2018-05-08 (×2): 25 ug via INTRAVENOUS

## 2018-05-08 MED ORDER — LACTATED RINGERS IV SOLN: INTRAVENOUS | Status: DC

## 2018-05-08 MED ORDER — CHLORHEXIDINE GLUCONATE 4 % EX LIQD: 60.0000 mL | Freq: Once | CUTANEOUS | Status: DC

## 2018-05-08 MED ORDER — HYDROCODONE-ACETAMINOPHEN 7.5-325 MG PO TABS: 1.0000 | ORAL_TABLET | Freq: Four times a day (QID) | ORAL | 0 refills | Status: DC | PRN

## 2018-05-08 MED ORDER — BUPIVACAINE HCL (PF) 0.25 % IJ SOLN
INTRAMUSCULAR | Status: AC
  Filled 2018-05-08: qty 30

## 2018-05-08 MED ORDER — FENTANYL CITRATE (PF) 100 MCG/2ML IJ SOLN: 50.0000 ug | INTRAMUSCULAR | Status: DC | PRN

## 2018-05-08 MED ORDER — MIDAZOLAM HCL 2 MG/2ML IJ SOLN
INTRAMUSCULAR | Status: AC
  Filled 2018-05-08: qty 2

## 2018-05-08 MED ORDER — PROPOFOL 500 MG/50ML IV EMUL
INTRAVENOUS | Status: DC | PRN
  Administered 2018-05-08: 75 ug/kg/min via INTRAVENOUS

## 2018-05-08 MED ORDER — BUPIVACAINE-EPINEPHRINE (PF) 0.25% -1:200000 IJ SOLN
INTRAMUSCULAR | Status: DC | PRN
  Administered 2018-05-08: 10 mL via PERINEURAL

## 2018-05-08 MED ORDER — PROPOFOL 10 MG/ML IV BOLUS
INTRAVENOUS | Status: DC | PRN
  Administered 2018-05-08: 20 mg via INTRAVENOUS
  Administered 2018-05-08 (×2): 30 mg via INTRAVENOUS
  Administered 2018-05-08: 20 mg via INTRAVENOUS

## 2018-05-08 MED ORDER — ONDANSETRON HCL 4 MG/2ML IJ SOLN
INTRAMUSCULAR | Status: AC
  Filled 2018-05-08: qty 2

## 2018-05-08 MED ORDER — EPINEPHRINE 30 MG/30ML IJ SOLN
INTRAMUSCULAR | Status: AC
  Filled 2018-05-08: qty 1

## 2018-05-08 MED ORDER — PROPOFOL 10 MG/ML IV BOLUS
INTRAVENOUS | Status: AC
  Filled 2018-05-08: qty 40

## 2018-05-08 MED ORDER — LACTATED RINGERS IV SOLN
INTRAVENOUS | Status: DC | PRN
  Administered 2018-05-08: 08:00:00 via INTRAVENOUS

## 2018-05-08 MED ORDER — CLINDAMYCIN PHOSPHATE 900 MG/50ML IV SOLN
INTRAVENOUS | Status: DC | PRN
  Administered 2018-05-08: 900 mg via INTRAVENOUS

## 2018-05-08 MED ORDER — ONDANSETRON HCL 4 MG/2ML IJ SOLN
INTRAMUSCULAR | Status: DC | PRN
  Administered 2018-05-08: 4 mg via INTRAVENOUS

## 2018-05-08 MED ORDER — ONDANSETRON HCL 4 MG PO TABS: 4.0000 mg | ORAL_TABLET | Freq: Three times a day (TID) | ORAL | 0 refills | Status: DC | PRN

## 2018-05-08 MED ORDER — HYDROCODONE-ACETAMINOPHEN 5-325 MG PO TABS
1.0000 | ORAL_TABLET | Freq: Once | ORAL | Status: AC
  Administered 2018-05-08: 1 via ORAL

## 2018-05-08 MED ORDER — HYDROCODONE-ACETAMINOPHEN 7.5-325 MG PO TABS: 1.0000 | ORAL_TABLET | Freq: Once | ORAL | Status: DC | PRN

## 2018-05-08 MED ORDER — LIDOCAINE 2% (20 MG/ML) 5 ML SYRINGE
INTRAMUSCULAR | Status: AC
  Filled 2018-05-08: qty 5

## 2018-05-08 MED ORDER — MIDAZOLAM HCL 5 MG/5ML IJ SOLN
INTRAMUSCULAR | Status: DC | PRN
  Administered 2018-05-08: 2 mg via INTRAVENOUS

## 2018-05-08 SURGICAL SUPPLY — 79 items
APL SKNCLS STERI-STRIP NONHPOA (GAUZE/BANDAGES/DRESSINGS)
BANDAGE ACE 4X5 VEL STRL LF (GAUZE/BANDAGES/DRESSINGS) IMPLANT
BANDAGE ACE 6X5 VEL STRL LF (GAUZE/BANDAGES/DRESSINGS) ×3 IMPLANT
BANDAGE ESMARK 6X9 LF (GAUZE/BANDAGES/DRESSINGS) ×1 IMPLANT
BENZOIN TINCTURE PRP APPL 2/3 (GAUZE/BANDAGES/DRESSINGS) IMPLANT
BLADE HEX COATED 2.75 (ELECTRODE) IMPLANT
BLADE SURG 15 STRL LF DISP TIS (BLADE) ×2 IMPLANT
BLADE SURG 15 STRL SS (BLADE)
BNDG CMPR 9X6 STRL LF SNTH (GAUZE/BANDAGES/DRESSINGS) ×1
BNDG COHESIVE 3X5 TAN STRL LF (GAUZE/BANDAGES/DRESSINGS) ×1 IMPLANT
BNDG ESMARK 6X9 LF (GAUZE/BANDAGES/DRESSINGS) ×3
CANISTER SUCT 1200ML W/VALVE (MISCELLANEOUS) ×3 IMPLANT
CLOSURE WOUND 1/2 X4 (GAUZE/BANDAGES/DRESSINGS)
COVER BACK TABLE 60X90IN (DRAPES) ×3 IMPLANT
COVER WAND RF STERILE (DRAPES) IMPLANT
CUFF TOURN SGL QUICK 24 (TOURNIQUET CUFF)
CUFF TOURN SGL QUICK 34 (TOURNIQUET CUFF)
CUFF TRNQT CYL 24X4X16.5-23 (TOURNIQUET CUFF) IMPLANT
CUFF TRNQT CYL 34X4.125X (TOURNIQUET CUFF) IMPLANT
DECANTER SPIKE VIAL GLASS SM (MISCELLANEOUS) IMPLANT
DRAPE EXTREMITY T 121X128X90 (DISPOSABLE) ×3 IMPLANT
DRAPE IMP U-DRAPE 54X76 (DRAPES) ×3 IMPLANT
DRAPE OEC MINIVIEW 54X84 (DRAPES) ×3 IMPLANT
DRAPE U-SHAPE 47X51 STRL (DRAPES) IMPLANT
DURAPREP 26ML APPLICATOR (WOUND CARE) ×3 IMPLANT
ELECT REM PT RETURN 9FT ADLT (ELECTROSURGICAL) ×3
ELECTRODE REM PT RTRN 9FT ADLT (ELECTROSURGICAL) ×1 IMPLANT
GAUZE 4X4 16PLY RFD (DISPOSABLE) IMPLANT
GAUZE SPONGE 4X4 12PLY STRL (GAUZE/BANDAGES/DRESSINGS) ×3 IMPLANT
GAUZE XEROFORM 1X8 LF (GAUZE/BANDAGES/DRESSINGS) ×3 IMPLANT
GLOVE BIO SURGEON STRL SZ 6.5 (GLOVE) ×1 IMPLANT
GLOVE BIO SURGEONS STRL SZ 6.5 (GLOVE) ×1
GLOVE BIOGEL PI IND STRL 7.0 (GLOVE) ×1 IMPLANT
GLOVE BIOGEL PI INDICATOR 7.0 (GLOVE) ×6
GLOVE ECLIPSE 7.0 STRL STRAW (GLOVE) ×3 IMPLANT
GLOVE SKINSENSE NS SZ7.5 (GLOVE) ×2
GLOVE SKINSENSE STRL SZ7.5 (GLOVE) ×1 IMPLANT
GLOVE SURG SYN 7.5  E (GLOVE) ×2
GLOVE SURG SYN 7.5 E (GLOVE) ×1 IMPLANT
GLOVE SURG SYN 7.5 PF PI (GLOVE) ×1 IMPLANT
GOWN STRL REIN XL XLG (GOWN DISPOSABLE) ×3 IMPLANT
GOWN STRL REUS W/ TWL LRG LVL3 (GOWN DISPOSABLE) ×1 IMPLANT
GOWN STRL REUS W/ TWL XL LVL3 (GOWN DISPOSABLE) ×1 IMPLANT
GOWN STRL REUS W/TWL LRG LVL3 (GOWN DISPOSABLE) ×3
GOWN STRL REUS W/TWL XL LVL3 (GOWN DISPOSABLE) ×3
NEEDLE HYPO 22GX1.5 SAFETY (NEEDLE) IMPLANT
NS IRRIG 1000ML POUR BTL (IV SOLUTION) ×3 IMPLANT
PACK BASIN DAY SURGERY FS (CUSTOM PROCEDURE TRAY) ×3 IMPLANT
PAD CAST 3X4 CTTN HI CHSV (CAST SUPPLIES) IMPLANT
PAD CAST 4YDX4 CTTN HI CHSV (CAST SUPPLIES) IMPLANT
PADDING CAST COTTON 3X4 STRL (CAST SUPPLIES) ×3
PADDING CAST COTTON 4X4 STRL (CAST SUPPLIES)
PADDING CAST COTTON 6X4 STRL (CAST SUPPLIES) IMPLANT
PADDING CAST SYN 6 (CAST SUPPLIES)
PADDING CAST SYNTHETIC 4 (CAST SUPPLIES)
PADDING CAST SYNTHETIC 4X4 STR (CAST SUPPLIES) IMPLANT
PADDING CAST SYNTHETIC 6X4 NS (CAST SUPPLIES) IMPLANT
PENCIL BUTTON HOLSTER BLD 10FT (ELECTRODE) ×1 IMPLANT
SLEEVE SCD COMPRESS KNEE MED (MISCELLANEOUS) IMPLANT
SPLINT FAST PLASTER 5X30 (CAST SUPPLIES)
SPLINT FIBERGLASS 3X35 (CAST SUPPLIES) IMPLANT
SPLINT FIBERGLASS 4X30 (CAST SUPPLIES) IMPLANT
SPLINT PLASTER CAST FAST 5X30 (CAST SUPPLIES) IMPLANT
SPONGE LAP 18X18 RF (DISPOSABLE) IMPLANT
STAPLER VISISTAT (STAPLE) IMPLANT
STRIP CLOSURE SKIN 1/2X4 (GAUZE/BANDAGES/DRESSINGS) IMPLANT
SUCTION FRAZIER HANDLE 10FR (MISCELLANEOUS) ×2
SUCTION TUBE FRAZIER 10FR DISP (MISCELLANEOUS) ×1 IMPLANT
SUT ETHILON 3 0 PS 1 (SUTURE) IMPLANT
SUT MNCRL AB 4-0 PS2 18 (SUTURE) IMPLANT
SUT VIC AB 2-0 CT1 27 (SUTURE)
SUT VIC AB 2-0 CT1 TAPERPNT 27 (SUTURE) IMPLANT
SYR BULB 3OZ (MISCELLANEOUS) ×3 IMPLANT
SYR CONTROL 10ML LL (SYRINGE) ×2 IMPLANT
TOWEL GREEN STERILE FF (TOWEL DISPOSABLE) ×3 IMPLANT
TUBE CONNECTING 20'X1/4 (TUBING) ×1
TUBE CONNECTING 20X1/4 (TUBING) ×2 IMPLANT
UNDERPAD 30X30 (UNDERPADS AND DIAPERS) ×3 IMPLANT
YANKAUER SUCT BULB TIP NO VENT (SUCTIONS) IMPLANT

## 2018-05-08 NOTE — Anesthesia Postprocedure Evaluation (Addendum)
Anesthesia Post Note  Patient: Erica [name]  Procedure(s) Performed: HARDWARE REMOVAL RIGHT HAND (Right Hand)     Patient location during evaluation: PACU Anesthesia Type: MAC Level of consciousness: awake and alert and oriented Pain management: pain level controlled Vital Signs Assessment: post-procedure vital signs reviewed and stable Respiratory status: spontaneous breathing, nonlabored ventilation and respiratory function stable Cardiovascular status: blood pressure returned to baseline and stable Postop Assessment: no apparent nausea or vomiting Anesthetic complications: no    Last Vitals:  Vitals:   05/08/18 0741  BP: (!) 125/93  Pulse: 80  Resp: 18  Temp: 36.6 C  SpO2: 99%    Last Pain:  Vitals:   05/08/18 0741  TempSrc: Oral  PainSc: 8                  Khristian Seals A.

## 2018-05-08 NOTE — Op Note (Signed)
   Date of Surgery: 05/08/2018  INDICATIONS: Erica [name] is a 38 y.o.-year-old female with retained right hand hardware;  The patient did consent to the procedure after discussion of the risks and benefits.  PREOPERATIVE DIAGNOSIS: 2 retained K wires in the right hand from previous percutaneous pinning  POSTOPERATIVE DIAGNOSIS: Same.  PROCEDURE: Removal of 2 retained K wires from right hand through separate incisions  SURGEON: N. Eduard Roux, M.D.  ASSIST: Ciro Backer [place], [place]; necessary for the timely completion of procedure and due to complexity of procedure.  ANESTHESIA: MAC  IV FLUIDS AND URINE: See anesthesia.  ESTIMATED BLOOD LOSS: Minimal mL.  IMPLANTS: None  DRAINS: None  COMPLICATIONS: None.  DESCRIPTION OF PROCEDURE: The patient was brought to the operating room and placed supine on the operating table.  The patient had been signed prior to the procedure and this was documented. The patient had the anesthesia placed by the anesthesiologist.  A time-out was performed to confirm that this was the correct patient, site, side and location. The patient did receive antibiotics prior to the incision and was re-dosed during the procedure as needed at indicated intervals.  A tourniquet was placed.  The patient had the operative extremity prepped and draped in the standard surgical fashion.   I first infiltrated 10 cc of 0.25% bupivacaine with epinephrine into the subcutaneous tissue and the planned surgical site.  I made a small incision over the fifth metacarpal head in order to expose the K wire head.  Gentle blunt dissection was performed using tenotomy scissors until I located the pin which was then gently remove without difficulty.  I made a second incision in the webspace between the fourth and fifth metacarpal head and dissection was performed down onto the K wire which was then removed without difficulty.  The surgical wound was then thoroughly irrigated.  X-rays were taken to  confirm stable alignment of the metacarpal fracture as well as complete removal of the K wires.  Hemostasis was obtained.  Sterile dressings were applied.  Patient tolerated procedure well had no immediate complications.  POSTOPERATIVE PLAN: She will be discharged home.  She will begin hand therapy in the next week.  Erica Cecil, MD [place] 8:52 AM

## 2018-05-08 NOTE — Transfer of Care (Signed)
Immediate Anesthesia Transfer of Care Note  Patient: Erica [name]  Procedure(s) Performed: HARDWARE REMOVAL RIGHT HAND (Right Hand)  Patient Location: PACU  Anesthesia Type:MAC  Level of Consciousness: drowsy and patient cooperative  Airway & Oxygen Therapy: Patient Spontanous Breathing and Patient connected to face mask oxygen  Post-op Assessment: Report given to RN and Post -op Vital signs reviewed and stable  Post vital signs: Reviewed and stable  Last Vitals:  Vitals Value Taken Time  BP 111/79 05/08/2018  9:06 AM  Temp    Pulse 74 05/08/2018  9:10 AM  Resp 13 05/08/2018  9:10 AM  SpO2 100 % 05/08/2018  9:10 AM  Vitals shown include unvalidated device data.  Last Pain:  Vitals:   05/08/18 0741  TempSrc: Oral  PainSc: 8       Patients Stated Pain Goal: 4 (81/44/81 8563)  Complications: No apparent anesthesia complications

## 2018-05-08 NOTE — Telephone Encounter (Signed)
Faxed Rx to Pt hand and specialist. Send original to scan.

## 2018-05-08 NOTE — H&P (Signed)
PREOPERATIVE H&P  Chief Complaint: right hand retained hardware  HPI: Erica [name] is a 38 y.o. female who presents for surgical treatment of right hand retained hardware.  She denies any changes in medical history.  Past Medical History:  Diagnosis Date  . Anemia    with pregnancy  . Anxiety   . Cervical radiculitis   . Dysrhythmia    Irregular heart beat  associated with abnormal tachycardia  . Endometriosis   . Finger fracture, left 10/2004  . Hemangioma 03/12/2017   3.5 cm benign hemangioma in the posterior right hepatic lobe  . History of appendicitis   . Irregular heart beat   . Migraines   . OCD (obsessive compulsive disorder)   . Ovarian cyst    left, hemorrhagic (1) right side (2)  . PONV (postoperative nausea and vomiting)   . Sciatica   . Umbilical hernia   . Wears glasses    Past Surgical History:  Procedure Laterality Date  . APPENDECTOMY    . ARTERY BIOPSY Left 10/25/2017   Procedure: BIOPSY TEMPORAL ARTERY LEFT;  Surgeon: Kieth Brightly, Arta Bruce, MD;  Location: [place];  Service: General;  Laterality: Left;  . CLOSED REDUCTION FINGER WITH PERCUTANEOUS PINNING Right 04/10/2018   Procedure: CLOSED REDUCTION FINGER WITH PERCUTANEOUS PINNING;  Surgeon: Leandrew Koyanagi, MD;  Location: [place];  Service: Orthopedics;  Laterality: Right;  . DIAGNOSTIC LAPAROSCOPY    . DILATION AND CURETTAGE OF UTERUS    . LAPAROSCOPIC TUBAL LIGATION Bilateral 12/12/2017   Procedure: LAPAROSCOPIC BILATERAL TUBAL LIGATION PARTIAL SALPINGECTOMY;  Surgeon: Florian Buff, MD;  Location: [facility];  Service: Gynecology;  Laterality: Bilateral;  . spinal tap     several  . TONSILLECTOMY AND ADENOIDECTOMY Bilateral 06/13/2016   Procedure: TONSILLECTOMY AND ADENOIDECTOMY;  Surgeon: Leta Baptist, MD;  Location: [place];  Service: ENT;  Laterality: Bilateral;  . WISDOM TOOTH EXTRACTION     Social History   Socioeconomic History  . Marital  status: Divorced    Spouse name: Not on file  . Number of children: Not on file  . Years of education: Not on file  . Highest education level: Not on file  Occupational History  . Not on file  Social Needs  . Financial resource strain: Not on file  . Food insecurity:    Worry: Not on file    Inability: Not on file  . Transportation needs:    Medical: Not on file    Non-medical: Not on file  Tobacco Use  . Smoking status: Former Smoker    Packs/day: 1.00    Years: 10.00    Pack years: 10.00    Types: Cigarettes    Last attempt to quit: 01/15/2013    Years since quitting: 5.3  . Smokeless tobacco: Never Used  . Tobacco comment: Quit smoking almost 2 years ago  Substance and Sexual Activity  . Alcohol use: Yes    Alcohol/week: 0.0 standard drinks    Comment: occassional  . Drug use: No  . Sexual activity: Yes    Birth control/protection: Surgical  Lifestyle  . Physical activity:    Days per week: Not on file    Minutes per session: Not on file  . Stress: Not on file  Relationships  . Social connections:    Talks on phone: Not on file    Gets together: Not on file    Attends religious service: Not on file  Active member of club or organization: Not on file    Attends meetings of clubs or organizations: Not on file    Relationship status: Not on file  Other Topics Concern  . Not on file  Social History Narrative   Lives with fiancee and 2 children in a one story home.  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  Education: currently in 70rd year of college.   Family History  Problem Relation Age of Onset  . Cancer Maternal Grandmother 21       ovarian, and uterine  . Cancer Mother 30       ovarian and Breast  . Bipolar disorder Mother   . Bipolar disorder Sister   . Depression Brother   . Healthy Son        x 2   Allergies  Allergen Reactions  . Amoxicillin Anaphylaxis    Has patient had a PCN reaction causing immediate rash, facial/tongue/throat swelling,  SOB or lightheadedness with hypotension: Yes Has patient had a PCN reaction causing severe rash involving mucus membranes or skin necrosis: No Has patient had a PCN reaction that required hospitalization: Yes Has patient had a PCN reaction occurring within the last 10 years: Yes If all of the above answers are "NO", then may proceed with Cephalosporin use.   . Effexor [Venlafaxine] Hives  . Other Hives    Poppy Seeds   Prior to Admission medications   Medication Sig Start Date End Date Taking? Authorizing Provider  buPROPion (WELLBUTRIN XL) 300 MG 24 hr tablet Take 1 tablet (300 mg total) by mouth daily. 04/23/18  Yes Norman Clay, MD  cyclobenzaprine (FLEXERIL) 10 MG tablet Take 1 tablet (10 mg total) by mouth at bedtime as needed for muscle spasms. Patient taking differently: Take 10 mg by mouth at bedtime.  08/29/17  Yes Patel, Donika K, DO  Magnesium Oxide 500 MG TABS Take 500 mg by mouth at bedtime.   Yes [provider]  Multiple Vitamin (MULTIVITAMIN) tablet Take 1 tablet by mouth at bedtime.    Yes [provider]  sertraline (ZOLOFT) 100 MG tablet Take 2 tablets (200 mg total) by mouth daily. 04/23/18  Yes Norman Clay, MD  topiramate (TOPAMAX) 50 MG tablet Take 1.5 tablets daily Patient taking differently: Take 75 mg by mouth at bedtime.  08/29/17  Yes Patel, Donika K, DO  rizatriptan (MAXALT) 10 MG tablet TAKE ONE TABLET BY MOUTH AS NEEDED FOR MIGRAINE. MAY REPEAT IN 2 HOURS IF NEEDED. 08/29/17   Narda Amber K, DO  sulfamethoxazole-trimethoprim (BACTRIM DS,SEPTRA DS) 800-160 MG tablet Take 1 tablet by mouth 2 (two) times daily. 05/07/18   Leandrew Koyanagi, MD     Positive ROS: All other systems have been reviewed and were otherwise negative with the exception of those mentioned in the HPI and as above.  Physical Exam: General: Alert, no acute distress Cardiovascular: No pedal edema Respiratory: No cyanosis, no use of accessory musculature GI: abdomen soft Skin: No  lesions in the area of chief complaint Neurologic: Sensation intact distally Psychiatric: Patient is competent for consent with normal mood and affect Lymphatic: no lymphedema  MUSCULOSKELETAL: exam stable  Assessment: right hand retained hardware  Plan: Plan for Procedure(s): HARDWARE REMOVAL RIGHT HAND  The risks benefits and alternatives were discussed with the patient including but not limited to the risks of nonoperative treatment, versus surgical intervention including infection, bleeding, nerve injury,  blood clots, cardiopulmonary complications, morbidity, mortality, among others, and they were willing  to proceed.   Eduard Roux, MD   05/08/2018 7:14 AM

## 2018-05-08 NOTE — Discharge Instructions (Signed)
Postoperative instructions:  Weightbearing instructions: no weight bearing  Keep your dressing and/or splint clean and dry at all times.  You can remove your dressing on post-operative day #3 and change with a dry/sterile dressing or Band-Aids as needed thereafter.    Incision instructions:  Do not soak your incision for 3 weeks after surgery.  If the incision gets wet, pat dry and do not scrub the incision.  Pain control:  You have been given a prescription to be taken as directed for post-operative pain control.  In addition, elevate the operative extremity above the heart at all times to prevent swelling and throbbing pain.  Take over-the-counter Colace, 100mg  by mouth twice a day while taking narcotic pain medications to help prevent constipation.  Follow up appointments: 1) 12-14 days for suture removal and wound check. 2) Dr. Erlinda Hong as scheduled.   -------------------------------------------------------------------------------------------------------------  After Surgery Pain Control:  After your surgery, post-surgical discomfort or pain is likely. This discomfort can last several days to a few weeks. At certain times of the day your discomfort may be more intense.  Did you receive a nerve block?  A nerve block can provide pain relief for one hour to two days after your surgery. As long as the nerve block is working, you will experience little or no sensation in the area the surgeon operated on.  As the nerve block wears off, you will begin to experience pain or discomfort. It is very important that you begin taking your prescribed pain medication before the nerve block fully wears off. Treating your pain at the first sign of the block wearing off will ensure your pain is better controlled and more tolerable when full-sensation returns. Do not wait until the pain is intolerable, as the medicine will be less effective. It is better to treat pain in advance than to try and catch up.  General  Anesthesia:  If you did not receive a nerve block during your surgery, you will need to start taking your pain medication shortly after your surgery and should continue to do so as prescribed by your surgeon.  Pain Medication:  Most commonly we prescribe Vicodin and Percocet for post-operative pain. Both of these medications contain a combination of acetaminophen (Tylenol) and a narcotic to help control pain.   It takes between 30 and 45 minutes before pain medication starts to work. It is important to take your medication before your pain level gets too intense.   Nausea is a common side effect of many pain medications. You will want to eat something before taking your pain medicine to help prevent nausea.   If you are taking a prescription pain medication that contains acetaminophen, we recommend that you do not take additional over the counter acetaminophen (Tylenol).  Other pain relieving options:   Using a cold pack to ice the affected area a few times a day (15 to 20 minutes at a time) can help to relieve pain, reduce swelling and bruising.   Elevation of the affected area can also help to reduce pain and swelling.      Post Anesthesia Home Care Instructions  Activity: Get plenty of rest for the remainder of the day. A responsible individual must stay with you for 24 hours following the procedure.  For the next 24 hours, DO NOT: -Drive a car -Paediatric nurse -Drink alcoholic beverages -Take any medication unless instructed by your physician -Make any legal decisions or sign important papers.  Meals: Start with liquid foods such as  gelatin or soup. Progress to regular foods as tolerated. Avoid greasy, spicy, heavy foods. If nausea and/or vomiting occur, drink only clear liquids until the nausea and/or vomiting subsides. Call your physician if vomiting continues.  Special Instructions/Symptoms: Your throat may feel dry or sore from the anesthesia or the breathing tube  placed in your throat during surgery. If this causes discomfort, gargle with warm salt water. The discomfort should disappear within 24 hours.  If you had a scopolamine patch placed behind your ear for the management of post- operative nausea and/or vomiting:  1. The medication in the patch is effective for 72 hours, after which it should be removed.  Wrap patch in a tissue and discard in the trash. Wash hands thoroughly with soap and water. 2. You may remove the patch earlier than 72 hours if you experience unpleasant side effects which may include dry mouth, dizziness or visual disturbances. 3. Avoid touching the patch. Wash your hands with soap and water after contact with the patch.

## 2018-05-09 NOTE — Addendum Note (Signed)
Addendum  created 05/09/18 1251 by Josephine Igo, MD   Clinical Note Signed, SmartForm saved

## 2018-05-10 ENCOUNTER — Encounter (HOSPITAL_BASED_OUTPATIENT_CLINIC_OR_DEPARTMENT_OTHER): Payer: Self-pay | Admitting: Orthopaedic Surgery

## 2018-05-22 ENCOUNTER — Encounter: Payer: Self-pay | Admitting: *Deleted

## 2018-05-22 ENCOUNTER — Encounter (INDEPENDENT_AMBULATORY_CARE_PROVIDER_SITE_OTHER): Payer: Self-pay | Admitting: Orthopaedic Surgery

## 2018-05-22 ENCOUNTER — Ambulatory Visit (INDEPENDENT_AMBULATORY_CARE_PROVIDER_SITE_OTHER): Payer: Medicaid Other | Admitting: Licensed Clinical Social Worker

## 2018-05-22 ENCOUNTER — Other Ambulatory Visit: Payer: Self-pay

## 2018-05-22 ENCOUNTER — Ambulatory Visit (INDEPENDENT_AMBULATORY_CARE_PROVIDER_SITE_OTHER): Payer: Medicaid Other

## 2018-05-22 ENCOUNTER — Ambulatory Visit (INDEPENDENT_AMBULATORY_CARE_PROVIDER_SITE_OTHER): Payer: Medicaid Other | Admitting: Physician Assistant

## 2018-05-22 DIAGNOSIS — M79641 Pain in right hand: Secondary | ICD-10-CM | POA: Diagnosis not present

## 2018-05-22 DIAGNOSIS — F429 Obsessive-compulsive disorder, unspecified: Secondary | ICD-10-CM | POA: Diagnosis not present

## 2018-05-22 DIAGNOSIS — F331 Major depressive disorder, recurrent, moderate: Secondary | ICD-10-CM | POA: Diagnosis not present

## 2018-05-22 DIAGNOSIS — S62336D Displaced fracture of neck of fifth metacarpal bone, right hand, subsequent encounter for fracture with routine healing: Secondary | ICD-10-CM

## 2018-05-22 DIAGNOSIS — F1021 Alcohol dependence, in remission: Secondary | ICD-10-CM | POA: Diagnosis not present

## 2018-05-22 NOTE — Progress Notes (Signed)
Post-Op Visit Note   Patient: Erica [name]           Date of Birth: [birth year]           MRN: [id number] Visit Date: 05/22/2018 PCP: Claretta Fraise, MD   Assessment & Plan:  Chief Complaint:  Chief Complaint  Patient presents with  . Right Hand - Pain, Routine Post Op   Visit Diagnoses:  1. Closed displaced fracture of neck of fifth metacarpal bone of right hand with routine healing, subsequent encounter   2. Right hand pain     Plan: Patient is a pleasant 38 year old female who presents our clinic today approximately 6 weeks status post pinning right hand fifth metacarpal fracture in 2 weeks status post pin removal.  She has been doing okay.  Still has moderate pain.  Taking hydrocodone for this.  She has been wearing a splint which does seem to help.  No fevers or chills.  Examination of her right hand reveals well-healed pinholes.  No ecchymosis.  Moderate tenderness along the fracture site.  She does have a moderate extension lag to the small finger.  She has mild to moderate rotation to the small finger as well.  She is neurovascularly intact distally.  At this point, we will transition her into a removable ulnar gutter splint.  We will also start her on aggressive hand therapy.  She will follow-up with [place] in 4 weeks time for repeat evaluation and x-rays.  Follow-Up Instructions: Return in about 4 weeks (around 06/19/2018).   Orders:  Orders Placed This Encounter  Procedures  . XR Hand Complete Right   No orders of the defined types were placed in this encounter.   Imaging: Xr Hand Complete Right  Result Date: 05/22/2018 X-rays demonstrate healing of the fracture without further displacement   PMFS History: Patient Active Problem List   Diagnosis Date Noted  . Right hand pain 05/22/2018  . Closed displaced fracture of neck of right fifth metacarpal bone 04/09/2018  . MDD (major depressive disorder), recurrent episode, moderate ([place]) 03/22/2018  . Encounter for  Nexplanon removal 10/17/2017  . Migraine without aura and without status migrainosus, not intractable 10/12/2016  . Nexplanon insertion 03/23/2016  . Cervical disc disorder with radiculopathy of cervical region 09/24/2014  . GAD (generalized anxiety disorder) 05/02/2013  . OCD (obsessive compulsive disorder) 05/02/2013  . Irregular heart beat    Past Medical History:  Diagnosis Date  . Anemia    with pregnancy  . Anxiety   . Cervical radiculitis   . Dysrhythmia    Irregular heart beat  associated with abnormal tachycardia  . Endometriosis   . Finger fracture, left 10/2004  . Hemangioma 03/12/2017   3.5 cm benign hemangioma in the posterior right hepatic lobe  . History of appendicitis   . Irregular heart beat   . Migraines   . OCD (obsessive compulsive disorder)   . Ovarian cyst    left, hemorrhagic (1) right side (2)  . PONV (postoperative nausea and vomiting)   . Sciatica   . Umbilical hernia   . Wears glasses     Family History  Problem Relation Age of Onset  . Cancer Maternal Grandmother 21       ovarian, and uterine  . Cancer Mother 30       ovarian and Breast  . Bipolar disorder Mother   . Bipolar disorder Sister   . Depression Brother   . Healthy Son  x 2    Past Surgical History:  Procedure Laterality Date  . APPENDECTOMY    . ARTERY BIOPSY Left 10/25/2017   Procedure: BIOPSY TEMPORAL ARTERY LEFT;  Surgeon: Kieth Brightly, Arta Bruce, MD;  Location: [place];  Service: General;  Laterality: Left;  . CLOSED REDUCTION FINGER WITH PERCUTANEOUS PINNING Right 04/10/2018   Procedure: CLOSED REDUCTION FINGER WITH PERCUTANEOUS PINNING;  Surgeon: Leandrew Koyanagi, MD;  Location: [place];  Service: Orthopedics;  Laterality: Right;  . DIAGNOSTIC LAPAROSCOPY    . DILATION AND CURETTAGE OF UTERUS    . HARDWARE REMOVAL Right 05/08/2018   Procedure: HARDWARE REMOVAL RIGHT HAND;  Surgeon: Leandrew Koyanagi, MD;  Location: [place];  Service: Orthopedics;  Laterality: Right;  . LAPAROSCOPIC TUBAL LIGATION Bilateral 12/12/2017   Procedure: LAPAROSCOPIC BILATERAL TUBAL LIGATION PARTIAL SALPINGECTOMY;  Surgeon: Florian Buff, MD;  Location: [facility];  Service: Gynecology;  Laterality: Bilateral;  . spinal tap     several  . TONSILLECTOMY AND ADENOIDECTOMY Bilateral 06/13/2016   Procedure: TONSILLECTOMY AND ADENOIDECTOMY;  Surgeon: Leta Baptist, MD;  Location: [place];  Service: ENT;  Laterality: Bilateral;  . WISDOM TOOTH EXTRACTION     Social History   Occupational History  . Not on file  Tobacco Use  . Smoking status: Former Smoker    Packs/day: 1.00    Years: 10.00    Pack years: 10.00    Types: Cigarettes    Last attempt to quit: 01/15/2013    Years since quitting: 5.3  . Smokeless tobacco: Never Used  . Tobacco comment: Quit smoking almost 2 years ago  Substance and Sexual Activity  . Alcohol use: Yes    Alcohol/week: 0.0 standard drinks    Comment: occassional  . Drug use: No  . Sexual activity: Yes    Birth control/protection: Surgical

## 2018-05-23 ENCOUNTER — Encounter: Payer: Self-pay | Admitting: Neurology

## 2018-05-23 ENCOUNTER — Encounter (HOSPITAL_COMMUNITY): Payer: Self-pay | Admitting: Licensed Clinical Social Worker

## 2018-05-23 ENCOUNTER — Telehealth (INDEPENDENT_AMBULATORY_CARE_PROVIDER_SITE_OTHER): Payer: Medicaid Other | Admitting: Neurology

## 2018-05-23 VITALS — Ht 69.0 in | Wt 206.0 lb

## 2018-05-23 DIAGNOSIS — R51 Headache: Secondary | ICD-10-CM | POA: Diagnosis not present

## 2018-05-23 DIAGNOSIS — R519 Headache, unspecified: Secondary | ICD-10-CM

## 2018-05-23 DIAGNOSIS — G43009 Migraine without aura, not intractable, without status migrainosus: Secondary | ICD-10-CM | POA: Diagnosis not present

## 2018-05-23 MED ORDER — RIZATRIPTAN BENZOATE 10 MG PO TBDP: 10.0000 mg | ORAL_TABLET | ORAL | 11 refills | Status: DC | PRN
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
## 2018-05-23 NOTE — Progress Notes (Signed)
   Virtual Visit via Video Note The purpose of this virtual visit is to provide medical care while limiting exposure to the novel coronavirus.    Consent was obtained for video visit:  Yes.   Answered questions that patient had about telehealth interaction:  Yes.   I discussed the limitations, risks, security and privacy concerns of performing an evaluation and management service by telemedicine. I also discussed with the patient that there may be a patient responsible charge related to this service. The patient expressed understanding and agreed to proceed.  Pt location: Home Physician Location: office Name of referring provider:  Claretta Fraise, MD I connected with Erica [name] at patients initiation/request on 05/23/2018 at  1:00 PM EDT by video enabled telemedicine application and verified that I am speaking with the correct person using two identifiers. Pt MRN:  [id number] Pt DOB:  August 17, 1980   History of Present Illness:  This is a 38 year-old female here for follow-up of migraines.  She was going through a very stressful period in December and was hospitalized for severe depression.  She was started on Wellbutrin 300mg  and has noticed significant improvement in headaches.  She discontinue topiramate in February.   She has only had 3-5 migraines per month, which is responsive to maxalt.    Since her last visit, she underwent CSF testing due to referral for papilledema, which showed normal opening pressure. She had repeat eye exam which showed improved visual acuity and no evidence of papilledema.   Due to left temporal tenderness and soft tissue changes on MRI brain, she had left temporal artery biopsy which was normal.  She no longer has severe temporal pain, if she brushes her hair, there is mild discomfort.    No new neurological complaints.     Observations/Objective:   Vitals:   05/23/18 0941  Weight: 206 lb (93.4 kg)  Height: 5\' 9"  (1.753 m)  She appears comfortable, no  acute distress.   She is awake, alert, and oriented.  Speech is clear. Face is symmetric, no ptosis. She is antigravity in all extremities.   Assessment and Plan:   1.  Chronic daily headaches  - Improved on Wellbutrin 300mg  daily, which she takes for depression   - She is no longer on topiramate, but responded very well to this if needed in the future  - Continue flexeril 10mg  as needed for tension headaches   - Avoid stressors  2.  Episodic migraines  - Continue maxalt 10mg  for severe migraine. Refills provided.    3.  Left temporal pain. Improved.     Follow Up Instructions:  I discussed the assessment and treatment plan with the patient. The patient was provided an opportunity to ask questions and all were answered. The patient agreed with the plan and demonstrated an understanding of the instructions.   The patient was advised to call back or seek an in-person evaluation if the symptoms worsen or if the condition fails to improve as anticipated.  Return to clinic in 6 months  Total Time spent in visit with the patient was:  20 minutes, of which more than 50% of the time was spent in counseling and/or coordinating care.   Pt understands and agrees with the plan of care outlined.     Alda Berthold, DO

## 2018-05-26 ENCOUNTER — Telehealth: Payer: Medicaid Other | Admitting: Nurse Practitioner

## 2018-05-26 DIAGNOSIS — R6889 Other general symptoms and signs: Principal | ICD-10-CM

## 2018-05-26 DIAGNOSIS — Z20822 Contact with and (suspected) exposure to covid-19: Secondary | ICD-10-CM

## 2018-05-26 NOTE — Progress Notes (Signed)
  E-Visit for [employer] [facility] Virus Screening  Based on what you have shared with me, you need to seek an evaluation for a severe illness that is causing your symptoms which may be coronavirus or some other illness. I recommend that you be seen and evaluated "face to face". Our Emergency Departments are best equipped to handle patients with severe symptoms. They will decide wether you need testing or not.   I recommend the following:  . If you are having a true medical emergency please call 911. . If you are considered high risk for Corona virus because of a known exposure, fever, shortness of breath and cough, OR if you have severe symptoms of any kind, seek medical care at an emergency room.  . Please call ahead and tell them that you were seen by telemedicine and they have recommended that you have a face to face evaluation. . [facility] Emergency Department [place], [place], [place] [address] [phone]  . [facility] Emergency Department [place], [place], [place] [address] [phone]  . [facility] Emergency Department [place], [place], [place] [address] [phone]  . [facility] Emergency Department [address] [place], [place], [place] [address] [phone]  . [facility] Emergency Department [place], [place], [place] [address] [phone]  NOTE: If you entered your credit card information for this eVisit, you will not be charged. You may see a "hold" on your card for the $35 but that hold will drop off and you will not have a charge processed.   Your e-visit answers were reviewed by a board certified advanced clinical practitioner to complete your personal care plan.  Thank you for using e-Visits.  5 minutes spent reviewing and documenting in chart.

## 2018-06-03 ENCOUNTER — Ambulatory Visit: Payer: Medicaid Other | Admitting: Neurology

## 2018-06-19 ENCOUNTER — Ambulatory Visit (INDEPENDENT_AMBULATORY_CARE_PROVIDER_SITE_OTHER): Payer: Medicaid Other | Admitting: Orthopaedic Surgery
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
## 2018-06-20 ENCOUNTER — Ambulatory Visit (INDEPENDENT_AMBULATORY_CARE_PROVIDER_SITE_OTHER): Payer: Medicaid Other | Admitting: Physician Assistant

## 2018-06-20 ENCOUNTER — Ambulatory Visit (INDEPENDENT_AMBULATORY_CARE_PROVIDER_SITE_OTHER): Payer: Medicaid Other

## 2018-06-20 ENCOUNTER — Other Ambulatory Visit: Payer: Self-pay

## 2018-06-20 ENCOUNTER — Encounter (INDEPENDENT_AMBULATORY_CARE_PROVIDER_SITE_OTHER): Payer: Self-pay | Admitting: Orthopaedic Surgery

## 2018-06-20 DIAGNOSIS — M79641 Pain in right hand: Secondary | ICD-10-CM | POA: Diagnosis not present

## 2018-06-20 DIAGNOSIS — S62336D Displaced fracture of neck of fifth metacarpal bone, right hand, subsequent encounter for fracture with routine healing: Secondary | ICD-10-CM

## 2018-06-20 NOTE — Progress Notes (Signed)
Post-Op Visit Note   Patient: Erica [name]           Date of Birth: 1980/09/29           MRN: [id number] Visit Date: 06/20/2018 PCP: Claretta Fraise, MD   Assessment & Plan:  Chief Complaint:  Chief Complaint  Patient presents with  . Right Hand - Follow-up   Visit Diagnoses:  1. Closed displaced fracture of neck of fifth metacarpal bone of right hand with routine healing, subsequent encounter   2. Right hand pain     Plan: Patient is a pleasant 38 year old female who presents our clinic today 10 weeks status post pinning right hand fifth metacarpal fracture, date of surgery 04/10/2018.  She is also 6 weeks status post removal retained hardware to the fracture, date of surgery 05/08/2018.  She has been doing okay.  She has been in formal physical therapy, but most recently working on a home exercise program with virtual visits due to the COVID-19 pandemic.  She has continued pain to the fracture site.  She is taking over-the-counter medications for this.  Examination of her right hand reveals marked tenderness to the fracture site.  She does have a soft contracture at the PIP joint of the small finger.  She is able make a full composite fist.  Fingers are warm and well-perfused.  At this point, her fracture is healed.  She will continue working on range of motion exercises.  She will follow-up with [place] in 6 weeks time for recheck.  Follow-Up Instructions: Return in about 6 weeks (around 08/01/2018).   Orders:  Orders Placed This Encounter  Procedures  . XR Hand Complete Right   No orders of the defined types were placed in this encounter.   Imaging: Xr Hand Complete Right  Result Date: 06/20/2018 X-rays demonstrate significant bony consolidation and callus formation   PMFS History: Patient Active Problem List   Diagnosis Date Noted  . Right hand pain 05/22/2018  . Closed displaced fracture of neck of right fifth metacarpal bone 04/09/2018  . MDD (major depressive disorder),  recurrent episode, moderate ([place]) 03/22/2018  . Encounter for Nexplanon removal 10/17/2017  . Migraine without aura and without status migrainosus, not intractable 10/12/2016  . Nexplanon insertion 03/23/2016  . Cervical disc disorder with radiculopathy of cervical region 09/24/2014  . GAD (generalized anxiety disorder) 05/02/2013  . OCD (obsessive compulsive disorder) 05/02/2013  . Irregular heart beat    Past Medical History:  Diagnosis Date  . Anemia    with pregnancy  . Anxiety   . Cervical radiculitis   . Dysrhythmia    Irregular heart beat  associated with abnormal tachycardia  . Endometriosis   . Finger fracture, left 10/2004  . Hemangioma 03/12/2017   3.5 cm benign hemangioma in the posterior right hepatic lobe  . History of appendicitis   . Irregular heart beat   . Migraines   . OCD (obsessive compulsive disorder)   . Ovarian cyst    left, hemorrhagic (1) right side (2)  . PONV (postoperative nausea and vomiting)   . Sciatica   . Umbilical hernia   . Wears glasses     Family History  Problem Relation Age of Onset  . Cancer Maternal Grandmother 21       ovarian, and uterine  . Cancer Mother 30       ovarian and Breast  . Bipolar disorder Mother   . Bipolar disorder Sister   . Depression Brother   .  Healthy Son        x 2    Past Surgical History:  Procedure Laterality Date  . APPENDECTOMY    . ARTERY BIOPSY Left 10/25/2017   Procedure: BIOPSY TEMPORAL ARTERY LEFT;  Surgeon: Kieth Brightly, Arta Bruce, MD;  Location: [place];  Service: General;  Laterality: Left;  . CLOSED REDUCTION FINGER WITH PERCUTANEOUS PINNING Right 04/10/2018   Procedure: CLOSED REDUCTION FINGER WITH PERCUTANEOUS PINNING;  Surgeon: Leandrew Koyanagi, MD;  Location: [place];  Service: Orthopedics;  Laterality: Right;  . DIAGNOSTIC LAPAROSCOPY    . DILATION AND CURETTAGE OF UTERUS    . HARDWARE REMOVAL Right 05/08/2018   Procedure: HARDWARE REMOVAL RIGHT HAND;   Surgeon: Leandrew Koyanagi, MD;  Location: [place];  Service: Orthopedics;  Laterality: Right;  . LAPAROSCOPIC TUBAL LIGATION Bilateral 12/12/2017   Procedure: LAPAROSCOPIC BILATERAL TUBAL LIGATION PARTIAL SALPINGECTOMY;  Surgeon: Florian Buff, MD;  Location: [facility];  Service: Gynecology;  Laterality: Bilateral;  . spinal tap     several  . TONSILLECTOMY AND ADENOIDECTOMY Bilateral 06/13/2016   Procedure: TONSILLECTOMY AND ADENOIDECTOMY;  Surgeon: Leta Baptist, MD;  Location: [place];  Service: ENT;  Laterality: Bilateral;  . WISDOM TOOTH EXTRACTION     Social History   Occupational History  . Not on file  Tobacco Use  . Smoking status: Former Smoker    Packs/day: 1.00    Years: 10.00    Pack years: 10.00    Types: Cigarettes    Last attempt to quit: 01/15/2013    Years since quitting: 5.4  . Smokeless tobacco: Never Used  . Tobacco comment: Quit smoking almost 2 years ago  Substance and Sexual Activity  . Alcohol use: Yes    Alcohol/week: 0.0 standard drinks    Comment: occassional  . Drug use: No  . Sexual activity: Yes    Birth control/protection: Surgical

## 2018-06-24 ENCOUNTER — Ambulatory Visit (INDEPENDENT_AMBULATORY_CARE_PROVIDER_SITE_OTHER): Payer: Medicaid Other | Admitting: Psychiatry

## 2018-06-24 ENCOUNTER — Other Ambulatory Visit: Payer: Self-pay

## 2018-06-24 ENCOUNTER — Encounter (HOSPITAL_COMMUNITY): Payer: Self-pay | Admitting: Psychiatry

## 2018-06-24 DIAGNOSIS — F330 Major depressive disorder, recurrent, mild: Secondary | ICD-10-CM | POA: Diagnosis not present

## 2018-06-24 MED ORDER — BUPROPION HCL ER (XL) 300 MG PO TB24: 300.0000 mg | ORAL_TABLET | Freq: Every day | ORAL | 0 refills | Status: DC

## 2018-06-24 MED ORDER — SERTRALINE HCL 100 MG PO TABS: 200.0000 mg | ORAL_TABLET | Freq: Every day | ORAL | 0 refills | Status: DC

## 2018-06-24 NOTE — Patient Instructions (Addendum)
1. Continue Sertraline 200 mg daily  2. Cotinue bupropion 300 mg daily   3. Next appointment: 7/13 at 10:20

## 2018-06-25 ENCOUNTER — Ambulatory Visit (INDEPENDENT_AMBULATORY_CARE_PROVIDER_SITE_OTHER): Payer: Medicaid Other | Admitting: Licensed Clinical Social Worker

## 2018-06-25 DIAGNOSIS — F331 Major depressive disorder, recurrent, moderate: Secondary | ICD-10-CM

## 2018-06-25 NOTE — Progress Notes (Signed)
Virtual Visit via Video Note  I connected with Erica [name] on 06/25/18 at  9:00 AM EDT by a video enabled telemedicine application and verified that I am speaking with the correct person using two identifiers.   I discussed the limitations of evaluation and management by telemedicine and the availability of in person appointments. The patient expressed understanding and agreed to proceed.  Participation Level: Active  Behavioral Response: CasualAlertIrritable  Type of Therapy: Individual Therapy  Treatment Goals addressed: Coping  Interventions: CBT and Solution Focused  Summary: Erica [name] is a 38 y.o. female who presents oriented x5 (person, place, situation, time, and object), casually dressed, appropriately groomed, average height, average weight, and cooperative to address mood. Patient has a history of medical treatment including migraines, and disc issues. Patient has a history of mental health treatment including outpatient therapy and medication management. Patient denies suicidal and homicidal ideations. She denies psychosis including auditory and visual hallucinations. Patient denies substance abuse but admits to alcohol use at times. She is at low risk for lethality at this time.  Physically: Patient is experienced disrupted sleep. She is trying to learn yoga poses to help calm her body and mind. Patient has also experienced an increase in appetite but attributes that to her menstrual cycle.  Spiritually/values: No issues identified.  Relationships: Patient has a strained relationship with her boyfriend right now. She feels like he needs to make up his mind about what he wants from their relationship and he hasn't expressed this. She has told him what she wants from the relationship. Patient is managing her feelings toward her children. They have frustrated her but she is trying to keep that under control.  Emotionally/Mentally/Behavior: Patient's mood has been up and down.  Patient identified that keeping a schedule for herself and her children help her with managing her mood. Patient has been dealing with the changes at work which have added more responsibilities for her. 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 Patient is trying to stay present focused and not allow her thoughts to get the best of her. She is staying grounded by having a daily schedule that helps her regulate her mood as well.   Suicidal/Homicidal: Nowithout intent/plan  Therapist Response: Therapist reviewed patient's recent thoughts and behaviors. Therapist utilized CBT to address mood. Therapist processed patient's feelings to identify triggers for mood. Therapist discussed with patient staying rooted in the present and steps she is taking to manger her stress and irritability.   Plan: Return again in 3 weeks.  Diagnosis: Axis I: MDD, recurrent episode, moderate    Axis II: No diagnosis I discussed the assessment and treatment plan with the patient. The patient was provided an opportunity to ask questions and all were answered. The patient agreed with the plan and demonstrated an understanding of the instructions.   The patient was advised to call back or seek an in-person evaluation if the symptoms worsen or if the condition fails to improve as anticipated.  I provided 40 minutes of non-face-to-face time during this encounter.   Glori Bickers, LCSW 06/25/2018

## 2018-07-09 ENCOUNTER — Ambulatory Visit (INDEPENDENT_AMBULATORY_CARE_PROVIDER_SITE_OTHER): Payer: Medicaid Other | Admitting: Licensed Clinical Social Worker

## 2018-07-09 ENCOUNTER — Other Ambulatory Visit: Payer: Self-pay

## 2018-07-09 ENCOUNTER — Encounter (HOSPITAL_COMMUNITY): Payer: Self-pay | Admitting: Licensed Clinical Social Worker

## 2018-07-09 DIAGNOSIS — F331 Major depressive disorder, recurrent, moderate: Secondary | ICD-10-CM

## 2018-07-09 NOTE — Progress Notes (Signed)
Virtual Visit via Video Note  I connected with Erica [name] on 07/09/18 at 11:00 AM EDT by a video enabled telemedicine application and verified that I am speaking with the correct person using two identifiers.   I discussed the limitations of evaluation and management by telemedicine and the availability of in person appointments. The patient expressed understanding and agreed to proceed.  Participation Level: Active  Behavioral Response: CasualAlertIrritable  Type of Therapy: Individual Therapy  Treatment Goals addressed: Coping  Interventions: CBT and Solution Focused  Summary: Erica [name] is a 38 y.o. female who presents oriented x5 (person, place, situation, time, and object), casually dressed, appropriately groomed, average height, average weight, and cooperative to address mood. Patient has a history of medical treatment including migraines, and disc issues. Patient has a history of mental health treatment including outpatient therapy and medication management. Patient denies suicidal and homicidal ideations. She denies psychosis including auditory and visual hallucinations. Patient denies substance abuse but admits to alcohol use at times. She is at low risk for lethality at this time.  Physically: Patient is tired. She is eating 2 times a day. She is staying active by working outside on various projects.  Spiritually/values: No issues identified.  Relationships: Patient is getting along with her children. Her relationship with her boyfriend/partner is still not defined but is going "ok."  Emotionally/Mentally/Behavior: Patient's mood has been ok. She has been trying to manage her routine and her work. Patient is managing on a day to day basis. 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   Suicidal/Homicidal: Nowithout intent/plan  Therapist Response: Therapist reviewed patient's  recent thoughts and behaviors. Therapist utilized CBT to address mood. Therapist processed patient's feelings to identify triggers for mood. Therapist discussed with patient her relationships and her daily routine to manage her mood.   Plan: Return again in 3 weeks.  Diagnosis: Axis I: MDD, recurrent episode, moderate    Axis II: No diagnosis I discussed the assessment and treatment plan with the patient. The patient was provided an opportunity to ask questions and all were answered. The patient agreed with the plan and demonstrated an understanding of the instructions.   The patient was advised to call back or seek an in-person evaluation if the symptoms worsen or if the condition fails to improve as anticipated.  I provided 40 minutes of non-face-to-face time during this encounter.   Glori Bickers, LCSW 07/09/2018

## 2018-08-01 ENCOUNTER — Encounter: Payer: Self-pay | Admitting: Orthopaedic Surgery

## 2018-08-01 ENCOUNTER — Ambulatory Visit (INDEPENDENT_AMBULATORY_CARE_PROVIDER_SITE_OTHER): Payer: Medicaid Other | Admitting: Physician Assistant

## 2018-08-01 ENCOUNTER — Other Ambulatory Visit: Payer: Self-pay

## 2018-08-01 DIAGNOSIS — M79641 Pain in right hand: Secondary | ICD-10-CM

## 2018-08-01 NOTE — Progress Notes (Signed)
Post-Op Visit Note   Patient: Erica [name]           Date of Birth: [birth year]           MRN: [id number] Visit Date: 08/01/2018 PCP: Claretta Fraise, MD   Assessment & Plan:  Chief Complaint:  Chief Complaint  Patient presents with  . Right Hand - Pain   Visit Diagnoses:  1. Right hand pain     Plan: Patient is a pleasant 38 year old female who presents our clinic today approximately 3-1/2 months status post pinning right hand fifth metacarpal fracture and approximately 3 months status post hardware removal from the initial surgery.  She has been doing well.  She has an occasional pain to the right small finger at the MCP joint.  No fevers or chills.  She has been doing hand therapy on her own making great progress.  She does complain of slight flexion contracture at the PIP joint of the small finger.  Examination of the right hand reveals no tenderness of the fracture site.  She does have a small flexion contracture to the PIP joint.  She is able to make a full composite fist.  She is neurovascularly intact distally.  At this point, she will continue with range of motion and strengthening exercises.  Her fracture has healed and she can advance with activity as tolerated.  She will follow-up with [place] as needed.  Follow-Up Instructions: Return if symptoms worsen or fail to improve.   Orders:  No orders of the defined types were placed in this encounter.  No orders of the defined types were placed in this encounter.   Imaging: No new imaging  PMFS History: Patient Active Problem List   Diagnosis Date Noted  . Right hand pain 05/22/2018  . Closed displaced fracture of neck of right fifth metacarpal bone 04/09/2018  . MDD (major depressive disorder), recurrent episode, moderate ([place]) 03/22/2018  . Encounter for Nexplanon removal 10/17/2017  . Migraine without aura and without status migrainosus, not intractable 10/12/2016  . Nexplanon insertion 03/23/2016  . Cervical disc  disorder with radiculopathy of cervical region 09/24/2014  . GAD (generalized anxiety disorder) 05/02/2013  . OCD (obsessive compulsive disorder) 05/02/2013  . Irregular heart beat    Past Medical History:  Diagnosis Date  . Anemia    with pregnancy  . Anxiety   . Cervical radiculitis   . Dysrhythmia    Irregular heart beat  associated with abnormal tachycardia  . Endometriosis   . Finger fracture, left 10/2004  . Hemangioma 03/12/2017   3.5 cm benign hemangioma in the posterior right hepatic lobe  . History of appendicitis   . Irregular heart beat   . Migraines   . OCD (obsessive compulsive disorder)   . Ovarian cyst    left, hemorrhagic (1) right side (2)  . PONV (postoperative nausea and vomiting)   . Sciatica   . Umbilical hernia   . Wears glasses     Family History  Problem Relation Age of Onset  . Cancer Maternal Grandmother 21       ovarian, and uterine  . Cancer Mother 30       ovarian and Breast  . Bipolar disorder Mother   . Bipolar disorder Sister   . Depression Brother   . Healthy Son        x 2    Past Surgical History:  Procedure Laterality Date  . APPENDECTOMY    . ARTERY BIOPSY Left 10/25/2017  Procedure: BIOPSY TEMPORAL ARTERY LEFT;  Surgeon: Kinsinger, Arta Bruce, MD;  Location: [facility];  Service: General;  Laterality: Left;  . CLOSED REDUCTION FINGER WITH PERCUTANEOUS PINNING Right 04/10/2018   Procedure: CLOSED REDUCTION FINGER WITH PERCUTANEOUS PINNING;  Surgeon: Leandrew Koyanagi, MD;  Location: [place];  Service: Orthopedics;  Laterality: Right;  . DIAGNOSTIC LAPAROSCOPY    . DILATION AND CURETTAGE OF UTERUS    . HARDWARE REMOVAL Right 05/08/2018   Procedure: HARDWARE REMOVAL RIGHT HAND;  Surgeon: Leandrew Koyanagi, MD;  Location: [place];  Service: Orthopedics;  Laterality: Right;  . LAPAROSCOPIC TUBAL LIGATION Bilateral 12/12/2017   Procedure: LAPAROSCOPIC BILATERAL TUBAL LIGATION PARTIAL  SALPINGECTOMY;  Surgeon: Florian Buff, MD;  Location: [facility];  Service: Gynecology;  Laterality: Bilateral;  . spinal tap     several  . TONSILLECTOMY AND ADENOIDECTOMY Bilateral 06/13/2016   Procedure: TONSILLECTOMY AND ADENOIDECTOMY;  Surgeon: Leta Baptist, MD;  Location: [place];  Service: ENT;  Laterality: Bilateral;  . WISDOM TOOTH EXTRACTION     Social History   Occupational History  . Not on file  Tobacco Use  . Smoking status: Former Smoker    Packs/day: 1.00    Years: 10.00    Pack years: 10.00    Types: Cigarettes    Last attempt to quit: 01/15/2013    Years since quitting: 5.5  . Smokeless tobacco: Never Used  . Tobacco comment: Quit smoking almost 2 years ago  Substance and Sexual Activity  . Alcohol use: Yes    Alcohol/week: 0.0 standard drinks    Comment: occassional  . Drug use: No  . Sexual activity: Yes    Birth control/protection: Surgical

## 2018-09-10 ENCOUNTER — Other Ambulatory Visit: Payer: Self-pay

## 2018-09-10 ENCOUNTER — Ambulatory Visit (INDEPENDENT_AMBULATORY_CARE_PROVIDER_SITE_OTHER): Payer: Medicaid Other | Admitting: Psychiatry

## 2018-09-10 ENCOUNTER — Encounter (HOSPITAL_COMMUNITY): Payer: Self-pay | Admitting: Psychiatry

## 2018-09-10 DIAGNOSIS — F1020 Alcohol dependence, uncomplicated: Secondary | ICD-10-CM | POA: Diagnosis not present

## 2018-09-10 DIAGNOSIS — F331 Major depressive disorder, recurrent, moderate: Secondary | ICD-10-CM

## 2018-09-10 MED ORDER — QUETIAPINE FUMARATE 25 MG PO TABS: 25.0000 mg | ORAL_TABLET | Freq: Every day | ORAL | 1 refills | Status: DC

## 2018-09-10 MED ORDER — BUPROPION HCL ER (XL) 300 MG PO TB24: 300.0000 mg | ORAL_TABLET | Freq: Every day | ORAL | 0 refills | Status: DC

## 2018-09-10 MED ORDER — SERTRALINE HCL 100 MG PO TABS: 200.0000 mg | ORAL_TABLET | Freq: Every day | ORAL | 0 refills | Status: DC
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
## 2018-09-10 NOTE — Patient Instructions (Signed)
1. Continue Sertraline 200 mg daily  2.Continuebupropion 300 mg daily  3. Start quetiapine 25 mg at night  4. Next appointment: 9/11 at 9:20

## 2018-09-19 ENCOUNTER — Ambulatory Visit (INDEPENDENT_AMBULATORY_CARE_PROVIDER_SITE_OTHER): Payer: Medicaid Other | Admitting: Licensed Clinical Social Worker

## 2018-09-19 ENCOUNTER — Encounter (HOSPITAL_COMMUNITY): Payer: Self-pay | Admitting: Licensed Clinical Social Worker

## 2018-09-19 ENCOUNTER — Other Ambulatory Visit: Payer: Self-pay

## 2018-09-19 DIAGNOSIS — F331 Major depressive disorder, recurrent, moderate: Secondary | ICD-10-CM

## 2018-09-19 NOTE — Progress Notes (Signed)
Virtual Visit via Video Note  I connected with Erica [name] on 09/19/18 at 10:00 AM EDT by a video enabled telemedicine application and verified that I am speaking with the correct person using two identifiers.   I discussed the limitations of evaluation and management by telemedicine and the availability of in person appointments. The patient expressed understanding and agreed to proceed.  Participation Level: Active  Behavioral Response: CasualAlertIrritable  Type of Therapy: Individual Therapy  Treatment Goals addressed: Coping  Interventions: CBT and Solution Focused  Summary: Erica [name] is a 38 y.o. female who presents oriented x5 (person, place, situation, time, and object), casually dressed, appropriately groomed, average height, average weight, and cooperative to address mood. Patient has a history of medical treatment including migraines, and disc issues. Patient has a history of mental health treatment including outpatient therapy and medication management. Patient denies suicidal and homicidal ideations. She denies psychosis including auditory and visual hallucinations. Patient denies substance abuse but admits to alcohol use at times. She is at low risk for lethality at this time.  Physically: Patient got more sleep than she has in some time last night. She was able to get 6 hours of sleep.   Spiritually/values: No issues identified.  Relationships: Patient is getting along with her children. She has had issues with her boyfriend. She feels like he has been with holding information from when he cheated on her in January. She continues to find pictures or pieces of information about him cheating which upsets her. She would rather him be upfront and honest so that she can deal with it and move forward. She is not allowing his behavior to detract her from her goals and plans.  Emotionally/Mentally/Behavior: Patient's mood has been ok but her OCD increased when she found more  information about her ex cheating back in January. Patient has had 2 job offers for new positions and has an interview today for a job she really wants. She is excited at the possibility and she is almost done with her Master degree. .   Suicidal/Homicidal: Nowithout intent/plan  Therapist Response: Therapist reviewed patient's recent thoughts and behaviors. Therapist utilized CBT to address mood. Therapist processed patient's feelings to identify triggers for mood. Therapist discussed with patient her relationships and mood.  Plan: Return again in 3 weeks.  Diagnosis: Axis I: MDD, recurrent episode, moderate    Axis II: No diagnosis I discussed the assessment and treatment plan with the patient. The patient was provided an opportunity to ask questions and all were answered. The patient agreed with the plan and demonstrated an understanding of the instructions.   The patient was advised to call back or seek an in-person evaluation if the symptoms worsen or if the condition fails to improve as anticipated.  I provided 40 minutes of non-face-to-face time during this encounter.   Glori Bickers, LCSW 09/19/2018
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
## 2018-11-08 ENCOUNTER — Ambulatory Visit (INDEPENDENT_AMBULATORY_CARE_PROVIDER_SITE_OTHER): Payer: Medicaid Other | Admitting: Psychiatry

## 2018-11-08 ENCOUNTER — Other Ambulatory Visit: Payer: Self-pay

## 2018-11-08 ENCOUNTER — Encounter (HOSPITAL_COMMUNITY): Payer: Self-pay | Admitting: Psychiatry

## 2018-11-08 DIAGNOSIS — F429 Obsessive-compulsive disorder, unspecified: Secondary | ICD-10-CM | POA: Diagnosis not present

## 2018-11-08 DIAGNOSIS — G4700 Insomnia, unspecified: Secondary | ICD-10-CM | POA: Diagnosis not present

## 2018-11-08 DIAGNOSIS — F331 Major depressive disorder, recurrent, moderate: Secondary | ICD-10-CM | POA: Diagnosis not present

## 2018-11-08 DIAGNOSIS — F4310 Post-traumatic stress disorder, unspecified: Secondary | ICD-10-CM

## 2018-11-08 MED ORDER — QUETIAPINE FUMARATE 50 MG PO TABS: 50.0000 mg | ORAL_TABLET | Freq: Every day | ORAL | 1 refills | Status: DC

## 2018-11-08 NOTE — Patient Instructions (Signed)
1. Continue Sertraline 200 mg daily  2.Continuebupropion 300 mg daily  3  Increase quetiapine 50 mg at night  4. Next appointment: 11/4 at 10:20

## 2018-11-13 ENCOUNTER — Other Ambulatory Visit: Payer: Self-pay

## 2018-11-13 ENCOUNTER — Encounter (HOSPITAL_COMMUNITY): Payer: Self-pay | Admitting: Licensed Clinical Social Worker

## 2018-11-13 ENCOUNTER — Ambulatory Visit (INDEPENDENT_AMBULATORY_CARE_PROVIDER_SITE_OTHER): Payer: Medicaid Other | Admitting: Licensed Clinical Social Worker

## 2018-11-13 DIAGNOSIS — F331 Major depressive disorder, recurrent, moderate: Secondary | ICD-10-CM | POA: Diagnosis not present

## 2018-11-13 NOTE — Progress Notes (Signed)
Virtual Visit via Video Note  I connected with Erica [name] on 11/13/18 at  9:00 AM EDT by a video enabled telemedicine application and verified that I am speaking with the correct person using two identifiers.   I discussed the limitations of evaluation and management by telemedicine and the availability of in person appointments. The patient expressed understanding and agreed to proceed.  Participation Level: Active  Behavioral Response: CasualAlertIrritable  Type of Therapy: Individual Therapy  Treatment Goals addressed: Coping  Interventions: CBT and Solution Focused  Summary: Erica [name] is a 38 y.o. female who presents oriented x5 (person, place, situation, time, and object), casually dressed, appropriately groomed, average height, average weight, and cooperative to address mood. Patient has a history of medical treatment including migraines, and disc issues. Patient has a history of mental health treatment including outpatient therapy and medication management. Patient denies suicidal and homicidal ideations. She denies psychosis including auditory and visual hallucinations. Patient denies substance abuse but admits to alcohol use at times. She is at low risk for lethality at this time.  Physically: Patient has had some difficulty with sleep. She notes that when her boyfriend is home she is able to sleep more. Patient was reading and before she knew it, it was 3 am. She then tried to sleep which consisted of tossing and turning. Patient agreed to make a sleep a priority and to try journaling earlier in the evening prior to bed to help her mind shut off. Patient also noted that she wants to start exercising which can help make her "tired." Patient says that she plans her day, the kids day, does a mental check list while in bed which causes her mind to not shut off  Spiritually/values: No issues identified.  Relationships: Patient is getting along with her children and boyfriend. She  feels like her mother is a narcissist and gets upset if patient doesn't "care" about some of the things her mother wants her to. For example, patient's mother reconnected with patient's biological father after 32 + years. Patient is not interested in having a relationship with him and her mother is upset about this. Patient made her peace with her father not being in her life years ago and is not interested in having a relationship. She has no problem with her mother dating whom ever she wants, patient just does not want to be put in the middle of it. Patient and her boyfriend are communicating better and are more open with their feelings in a healthy way.  Emotionally/Mentally/Behavior: Patient's mood was stable. She denies depression. She denies anxiety. Patient admits to some irritability at times. Patient recognizes that at times she can be blunt which can upset people and they view her as being mean or aggressive. Patient is honest with others but knows that she could soften her communication with others because not everyone is like her.   Suicidal/Homicidal: Nowithout intent/plan  Therapist Response: Therapist reviewed patient's recent thoughts and behaviors. Therapist utilized CBT to address mood. Therapist processed patient's feelings to identify triggers for mood. Therapist provided psychoeducation on sleep. Therapist assisted patient in identifying ways to improve sleep. Therapist discussed patient's relationships and her communication style in relationships. Therapist updated treatment plan.   Plan: Return again in 3 weeks.  Diagnosis: Axis I: MDD, recurrent episode, moderate    Axis II: No diagnosis I discussed the assessment and treatment plan with the patient. The patient was provided an opportunity to ask questions and all were answered. The  patient agreed with the plan and demonstrated an understanding of the instructions.   The patient was advised to call back or seek an in-person  evaluation if the symptoms worsen or if the condition fails to improve as anticipated.  I provided 50 minutes of non-face-to-face time during this encounter.   Glori Bickers, LCSW 11/13/2018

## 2018-11-21 ENCOUNTER — Encounter: Payer: Self-pay | Admitting: Neurology

## 2018-11-25 ENCOUNTER — Other Ambulatory Visit: Payer: Self-pay

## 2018-11-25 ENCOUNTER — Ambulatory Visit: Payer: Medicaid Other | Admitting: Neurology

## 2018-11-25 ENCOUNTER — Encounter: Payer: Self-pay | Admitting: Neurology

## 2018-11-25 VITALS — BP 138/72 | HR 73 | Ht 69.0 in | Wt 230.0 lb

## 2018-11-25 DIAGNOSIS — G43009 Migraine without aura, not intractable, without status migrainosus: Secondary | ICD-10-CM | POA: Diagnosis not present

## 2018-11-25 DIAGNOSIS — R51 Headache: Secondary | ICD-10-CM

## 2018-11-25 DIAGNOSIS — R519 Headache, unspecified: Secondary | ICD-10-CM

## 2018-11-25 MED ORDER — TOPIRAMATE 50 MG PO TABS: ORAL_TABLET | ORAL | 3 refills | Status: DC
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
## 2018-11-25 NOTE — Patient Instructions (Addendum)
Please follow-up with ENT Dr. Tamsen Roers Teoh for the left temporal fullness  Start topirmate 25mg  daily x 2 weeks, then increase to 50mg  daily  Return to clinic in 4 months

## 2018-11-28 ENCOUNTER — Other Ambulatory Visit: Payer: Self-pay

## 2018-11-28 ENCOUNTER — Ambulatory Visit (INDEPENDENT_AMBULATORY_CARE_PROVIDER_SITE_OTHER): Payer: Medicaid Other | Admitting: Otolaryngology

## 2018-11-28 DIAGNOSIS — R51 Headache with orthostatic component, not elsewhere classified: Secondary | ICD-10-CM | POA: Diagnosis not present

## 2018-12-16 ENCOUNTER — Encounter (HOSPITAL_COMMUNITY): Payer: Self-pay | Admitting: Licensed Clinical Social Worker

## 2018-12-16 ENCOUNTER — Other Ambulatory Visit: Payer: Self-pay

## 2018-12-16 ENCOUNTER — Ambulatory Visit (INDEPENDENT_AMBULATORY_CARE_PROVIDER_SITE_OTHER): Payer: Medicaid Other | Admitting: Licensed Clinical Social Worker

## 2018-12-16 DIAGNOSIS — F331 Major depressive disorder, recurrent, moderate: Secondary | ICD-10-CM | POA: Diagnosis not present
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
## 2019-01-01 ENCOUNTER — Ambulatory Visit (INDEPENDENT_AMBULATORY_CARE_PROVIDER_SITE_OTHER): Payer: Medicaid Other | Admitting: Psychiatry

## 2019-01-01 ENCOUNTER — Other Ambulatory Visit: Payer: Self-pay

## 2019-01-01 ENCOUNTER — Ambulatory Visit (HOSPITAL_COMMUNITY): Payer: Medicaid Other | Admitting: Psychiatry

## 2019-01-01 ENCOUNTER — Encounter (HOSPITAL_COMMUNITY): Payer: Self-pay | Admitting: Psychiatry

## 2019-01-01 DIAGNOSIS — F4310 Post-traumatic stress disorder, unspecified: Secondary | ICD-10-CM | POA: Diagnosis not present

## 2019-01-01 DIAGNOSIS — F330 Major depressive disorder, recurrent, mild: Secondary | ICD-10-CM | POA: Diagnosis not present

## 2019-01-01 MED ORDER — QUETIAPINE FUMARATE 50 MG PO TABS: 50.0000 mg | ORAL_TABLET | Freq: Every day | ORAL | 1 refills | Status: DC

## 2019-01-01 MED ORDER — BUPROPION HCL ER (XL) 300 MG PO TB24: 300.0000 mg | ORAL_TABLET | Freq: Every day | ORAL | 1 refills | Status: DC

## 2019-01-01 MED ORDER — SERTRALINE HCL 100 MG PO TABS: 200.0000 mg | ORAL_TABLET | Freq: Every day | ORAL | 1 refills | Status: DC

## 2019-01-01 NOTE — Patient Instructions (Signed)
1. Continue Sertraline 200 mg daily  2.Continuebupropion 300 mg daily  3  Continue quetiapine 50 mg at night 4. Next appointment:in January

## 2019-02-17 ENCOUNTER — Other Ambulatory Visit: Payer: Self-pay

## 2019-02-17 ENCOUNTER — Ambulatory Visit (INDEPENDENT_AMBULATORY_CARE_PROVIDER_SITE_OTHER): Payer: Medicaid Other | Admitting: Licensed Clinical Social Worker

## 2019-02-17 DIAGNOSIS — F331 Major depressive disorder, recurrent, moderate: Secondary | ICD-10-CM
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
## 2019-03-11 ENCOUNTER — Ambulatory Visit (INDEPENDENT_AMBULATORY_CARE_PROVIDER_SITE_OTHER): Payer: Medicaid Other | Admitting: Clinical

## 2019-03-11 ENCOUNTER — Other Ambulatory Visit: Payer: Self-pay

## 2019-03-11 DIAGNOSIS — F429 Obsessive-compulsive disorder, unspecified: Secondary | ICD-10-CM | POA: Diagnosis not present

## 2019-03-11 DIAGNOSIS — F331 Major depressive disorder, recurrent, moderate: Secondary | ICD-10-CM

## 2019-03-11 DIAGNOSIS — F411 Generalized anxiety disorder: Secondary | ICD-10-CM
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
## 2019-03-13 ENCOUNTER — Ambulatory Visit (HOSPITAL_COMMUNITY): Payer: Medicaid Other | Admitting: Psychiatry

## 2019-03-21 ENCOUNTER — Other Ambulatory Visit: Payer: Self-pay

## 2019-03-21 ENCOUNTER — Ambulatory Visit: Payer: Medicaid Other | Attending: Internal Medicine

## 2019-03-21 DIAGNOSIS — Z20822 Contact with and (suspected) exposure to covid-19: Secondary | ICD-10-CM | POA: Insufficient documentation

## 2019-03-22 LAB — NOVEL CORONAVIRUS, NAA: SARS-CoV-2, NAA: NOT DETECTED

## 2019-03-24 ENCOUNTER — Other Ambulatory Visit: Payer: Self-pay

## 2019-03-24 ENCOUNTER — Ambulatory Visit: Payer: Medicaid Other | Attending: Internal Medicine

## 2019-03-24 DIAGNOSIS — Z20822 Contact with and (suspected) exposure to covid-19: Secondary | ICD-10-CM

## 2019-03-25 LAB — NOVEL CORONAVIRUS, NAA: SARS-CoV-2, NAA: NOT DETECTED

## 2019-03-26 ENCOUNTER — Ambulatory Visit (INDEPENDENT_AMBULATORY_CARE_PROVIDER_SITE_OTHER): Payer: Medicaid Other | Admitting: Clinical

## 2019-03-26 ENCOUNTER — Other Ambulatory Visit: Payer: Self-pay

## 2019-03-26 DIAGNOSIS — F411 Generalized anxiety disorder: Secondary | ICD-10-CM

## 2019-03-26 DIAGNOSIS — F429 Obsessive-compulsive disorder, unspecified: Secondary | ICD-10-CM

## 2019-03-26 DIAGNOSIS — F331 Major depressive disorder, recurrent, moderate: Secondary | ICD-10-CM

## 2019-03-26 NOTE — Progress Notes (Signed)
Virtual Visit via Telephone Note  I connected with Erica [name] on 03/26/19 at  9:00 AM EST by telephone and verified that I am speaking with the correct person using two identifiers.  Location: Patient: Home Provider: Office   I discussed the limitations, risks, security and privacy concerns of performing an evaluation and management service by telephone and the availability of in person appointments. I also discussed with the patient that there may be a patient responsible charge related to this service. The patient expressed understanding and agreed to proceed.            THERAPIST PROGRESS NOTE  Session Time: 9:00AM-9:53AM  Participation Level: Active  Behavioral Response: CasualAlertAngry  Type of Therapy: Individual Therapy  Treatment Goals addressed: Anger  Interventions: CBT  Summary: Erica [name] is a 39 y.o. female who presents with MDD/GAD/OCD. The OPT therapist worked with the client for her initial session. The OPT therapist utilized Motivational Interviewing to assist in creating therapeutic repore. The patient in the session was engaged and work in collaboration giving feedback about her triggers and symptoms over the past few weeks. The OPT therapist utilized Cognitive Behavioral Therapy through cognitive restructuring as well as worked with the patient on coping strategies to assist in management of MDD symptoms. T Suicidal/Homicidal: Nowithout intent/plan  Therapist Response: The OPT therapist worked with the patient for the patients initial scheduled session. The patient was engaged in her session and gave feedback in relation to triggers, symptoms, and behavior responses over the past 2 weeks including potentially having COVID. The OPT therapist worked with the patient utilizing an in session Cognitive Behavioral Therapy exercise. The patient was responsive in the session and verbalized, " I am willing to work on my communication and try positive thinking".  The OPT therapist will continue treatment work with the patient in her next scheduled session.  Plan: Return again in 2 weeks.  Diagnosis: Axis I: Major depressive disorder, recurrent episode, moderate , Generalized Anxiety Disorder and Obsessive Compulsive Disorder     Axis II: No diagnosis  I discussed the assessment and treatment plan with the patient. The patient was provided an opportunity to ask questions and all were answered. The patient agreed with the plan and demonstrated an understanding of the instructions.   The patient was advised to call back or seek an in-person evaluation if the symptoms worsen or if the condition fails to improve as anticipated.  I provided 53 minutes of non-face-to-face time during this encounter.  Lennox Grumbles, LCSW 03/26/2019

## 2019-04-02 ENCOUNTER — Ambulatory Visit (INDEPENDENT_AMBULATORY_CARE_PROVIDER_SITE_OTHER): Payer: Medicaid Other | Admitting: Psychiatry

## 2019-04-02 ENCOUNTER — Other Ambulatory Visit: Payer: Self-pay

## 2019-04-02 ENCOUNTER — Encounter: Payer: Self-pay | Admitting: Psychiatry

## 2019-04-02 DIAGNOSIS — F429 Obsessive-compulsive disorder, unspecified: Secondary | ICD-10-CM

## 2019-04-02 DIAGNOSIS — F3342 Major depressive disorder, recurrent, in full remission: Secondary | ICD-10-CM

## 2019-04-02 DIAGNOSIS — F4310 Post-traumatic stress disorder, unspecified: Secondary | ICD-10-CM

## 2019-04-02 MED ORDER — QUETIAPINE FUMARATE 50 MG PO TABS: 50.0000 mg | ORAL_TABLET | Freq: Every day | ORAL | 0 refills | Status: DC

## 2019-04-02 MED ORDER — BUPROPION HCL ER (XL) 300 MG PO TB24: 300.0000 mg | ORAL_TABLET | Freq: Every day | ORAL | 0 refills | Status: DC

## 2019-04-02 MED ORDER — SERTRALINE HCL 100 MG PO TABS: 200.0000 mg | ORAL_TABLET | Freq: Every day | ORAL | 0 refills | Status: DC

## 2019-04-02 NOTE — Progress Notes (Signed)
[facility] MD OP Progress Note  Virtual Visit via Telephone Note  I connected with Erica [name] on 04/02/19 at  2:00 PM EST by telephone and verified that I am speaking with the correct person using two identifiers.    I discussed the limitations, risks, security and privacy concerns of performing an evaluation and management service by telephone and the availability of in person appointments. I also discussed with the patient that there may be a patient responsible charge related to this service. The patient expressed understanding and agreed to proceed.  The session started as a video enabled session, however, due to audio technical issues the session was switched to phone visit.   04/02/2019 2:08 PM Erica [name]  MRN:  [id number]  Chief Complaint: " I am doing fine."  HPI:  Patient reported that she is doing fine on her current medication regimen.  Her mood and anxiety have been stable.  She informed that her sleep is okay except for a few days that she has difficulty but overall things are manageable.  She has started talking to any therapist Mr. Coralyn Mark and is hoping to continue doing that.  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  She denied any acute issues or concerns at this time.   Visit Diagnosis:    ICD-10-CM   1. PTSD (post-traumatic stress disorder)  F43.10   2. MDD (major depressive disorder), recurrent, in full remission ([place])  F33.42   3. 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 Anemia    with pregnancy  . Anxiety   . Cervical radiculitis   . Dysrhythmia    Irregular heart beat  associated with abnormal tachycardia  . Endometriosis   . Finger fracture, left 10/2004  . Hemangioma 03/12/2017   3.5 cm benign hemangioma in the posterior right hepatic lobe   . History of appendicitis   . Irregular heart beat   . Migraines   . OCD (obsessive compulsive disorder)   . Ovarian cyst    left, hemorrhagic (1) right side (2)  . PONV (postoperative nausea and vomiting)   . Sciatica   . Umbilical hernia   . Wears glasses     Past Surgical History:  Procedure Laterality Date  . APPENDECTOMY    . ARTERY BIOPSY Left 10/25/2017   Procedure: BIOPSY TEMPORAL ARTERY LEFT;  Surgeon: Kieth Brightly, Arta Bruce, MD;  Location: [facility];  Service: General;  Laterality: Left;  . CLOSED REDUCTION FINGER WITH PERCUTANEOUS PINNING Right 04/10/2018   Procedure: CLOSED REDUCTION FINGER WITH PERCUTANEOUS PINNING;  Surgeon: Leandrew Koyanagi, MD;  Location: [facility];  Service: Orthopedics;  Laterality: Right;  . DIAGNOSTIC LAPAROSCOPY    . DILATION AND CURETTAGE OF UTERUS    . HARDWARE REMOVAL Right 05/08/2018   Procedure: HARDWARE REMOVAL RIGHT HAND;  Surgeon: Leandrew Koyanagi, MD;  Location: [facility];  Service: Orthopedics;  Laterality: Right;  . LAPAROSCOPIC TUBAL LIGATION Bilateral 12/12/2017   Procedure: LAPAROSCOPIC BILATERAL TUBAL LIGATION PARTIAL SALPINGECTOMY;  Surgeon: Florian Buff, MD;  Location: [facility];  Service: Gynecology;  Laterality: Bilateral;  . spinal tap     several  . TONSILLECTOMY AND ADENOIDECTOMY Bilateral 06/13/2016   Procedure: TONSILLECTOMY AND ADENOIDECTOMY;  Surgeon: Leta Baptist,  MD;  Location: [facility];  Service: ENT;  Laterality: Bilateral;  . WISDOM TOOTH EXTRACTION      Family Psychiatric History: see below  Family History:  Family History  Problem Relation Age of Onset  . Cancer Maternal Grandmother 21       ovarian, and uterine  . Cancer Mother 30       ovarian and Breast  . Bipolar disorder Mother   . Bipolar disorder Sister   . Depression Brother   . Healthy Son        x 2    Social History:  Social History   Socioeconomic History  . Marital status: Divorced     Spouse name: Not on file  . Number of children: 2  . Years of education: 48  . Highest education level: Not on file  Occupational History  . Occupation: [employer]  Tobacco Use  . Smoking status: Former Smoker    Packs/day: 1.00    Years: 10.00    Pack years: 10.00    Types: Cigarettes    Quit date: 01/15/2013    Years since quitting: 6.2  . Smokeless tobacco: Never Used  . Tobacco comment: Quit smoking almost 2 years ago  Substance and Sexual Activity  . Alcohol use: Yes    Alcohol/week: 0.0 standard drinks    Comment: occassional  . Drug use: No  . Sexual activity: Yes    Birth control/protection: Surgical  Other Topics Concern  . Not on file  Social History Narrative   Lives with fiancee and 2 children in a one story home.  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  Education: currently in 28rd year of college.right handed    Social Determinants of Health   Financial Resource Strain:   . Difficulty of Paying Living Expenses: Not on file  Food Insecurity:   . Worried About Charity fundraiser in the Last Year: Not on file  . Ran Out of Food in the Last Year: Not on file  Transportation Needs:   . Lack of Transportation (Medical): Not on file  . Lack of Transportation (Non-Medical): Not on file  Physical Activity:   . Days of Exercise per Week: Not on file  . Minutes of Exercise per Session: Not on file  Stress:   . Feeling of Stress : Not on file  Social Connections:   . Frequency of Communication with Friends and Family: Not on file  . Frequency of Social Gatherings with Friends and Family: Not on file  . Attends Religious Services: Not on file  . Active Member of Clubs or Organizations: Not on file  . Attends Archivist Meetings: Not on file  . Marital Status: Not on file    Allergies:  Allergies  Allergen Reactions  . Amoxicillin Anaphylaxis    Has patient had a PCN reaction causing immediate rash, facial/tongue/throat swelling,  SOB or lightheadedness with hypotension: Yes Has patient had a PCN reaction causing severe rash involving mucus membranes or skin necrosis: No Has patient had a PCN reaction that required hospitalization: Yes Has patient had a PCN reaction occurring within the last 10 years: Yes If all of the above answers are "NO", then may proceed with Cephalosporin use.   . Effexor [Venlafaxine] Hives  . Other Hives    Poppy Seeds    Metabolic Disorder Labs: No results found for: HGBA1C, MPG No results found for: PROLACTIN Lab Results  Component Value Date  CHOL 222 (H) 05/12/2014   TRIG 260 (H) 05/12/2014   HDL 29 (L) 05/12/2014   Lab Results  Component Value Date   TSH 0.50 03/22/2018   TSH 2.090 05/12/2014    Therapeutic Level Labs: No results found for: LITHIUM No results found for: VALPROATE No components found for:  CBMZ  Current Medications: Current Outpatient Medications  Medication Sig Dispense Refill  . buPROPion (WELLBUTRIN XL) 300 MG 24 hr tablet Take 1 tablet (300 mg total) by mouth daily. 90 tablet 1  . QUEtiapine (SEROQUEL) 50 MG tablet Take 1 tablet (50 mg total) by mouth at bedtime. 90 tablet 1  . rizatriptan (MAXALT-MLT) 10 MG disintegrating tablet Take 1 tablet (10 mg total) by mouth as needed for migraine. May repeat in 2 hours if needed 9 tablet 11  . sertraline (ZOLOFT) 100 MG tablet Take 2 tablets (200 mg total) by mouth daily. 180 tablet 1  . sulfamethoxazole-trimethoprim (BACTRIM DS,SEPTRA DS) 800-160 MG tablet Take 1 tablet by mouth 2 (two) times daily. 30 tablet 0  . topiramate (TOPAMAX) 50 MG tablet Take 0.5 tablet daily for 2 weeks, then increase to 1 tablet daily 90 tablet 3   No current facility-administered medications for this visit.       Screenings: PHQ2-9     Office Visit from 05/14/2017 in [facility] Visit from 02/15/2017 in [facility] Visit from 10/12/2016 in [facility] Visit from 03/23/2016 in [facility] Visit from 03/20/2016 in [facility]  PHQ-2 Total Score  0  2  4  0  0  PHQ-9 Total Score  --  8  17  --  --       Assessment and Plan: 39 year old female with history of PTSD, depression, OCD and alcohol use disorder in early remission now contacted for follow-up.  Patient reported doing well on the current regimen and denied any acute issues.  1. PTSD (post-traumatic stress disorder)  - buPROPion (WELLBUTRIN XL) 300 MG 24 hr tablet; Take 1 tablet (300 mg total) by mouth daily.  Dispense: 90 tablet; Refill: 0 - sertraline (ZOLOFT) 100 MG tablet; Take 2 tablets (200 mg total) by mouth daily.  Dispense: 180 tablet; Refill: 0  2. MDD (major depressive disorder), recurrent, in full remission ([place]) - buPROPion (WELLBUTRIN XL) 300 MG 24 hr tablet; Take 1 tablet (300 mg total) by mouth daily.  Dispense: 90 tablet; Refill: 0 - QUEtiapine (SEROQUEL) 50 MG tablet; Take 1 tablet (50 mg total) by mouth at bedtime.  Dispense: 90 tablet; Refill: 0 - sertraline (ZOLOFT) 100 MG tablet; Take 2 tablets (200 mg total) by mouth daily.  Dispense: 180 tablet; Refill: 0  3. Obsessive-compulsive disorder, unspecified type  - sertraline (ZOLOFT) 100 MG tablet; Take 2 tablets (200 mg total) by mouth daily.  Dispense: 180 tablet; Refill: 0  Continue same medication regimen. Follow up in 3 months.   [place] Crane, MD 04/02/2019, 2:08 PM

## 2019-04-14 ENCOUNTER — Other Ambulatory Visit: Payer: Self-pay

## 2019-04-14 ENCOUNTER — Ambulatory Visit (HOSPITAL_COMMUNITY): Payer: Medicaid Other | Admitting: Clinical

## 2019-04-28 ENCOUNTER — Other Ambulatory Visit: Payer: Self-pay

## 2019-04-28 ENCOUNTER — Ambulatory Visit (INDEPENDENT_AMBULATORY_CARE_PROVIDER_SITE_OTHER): Payer: 59 | Admitting: Clinical

## 2019-04-28 ENCOUNTER — Telehealth: Payer: Self-pay

## 2019-04-28 DIAGNOSIS — F411 Generalized anxiety disorder: Secondary | ICD-10-CM | POA: Diagnosis not present

## 2019-04-28 DIAGNOSIS — F429 Obsessive-compulsive disorder, unspecified: Secondary | ICD-10-CM | POA: Diagnosis not present

## 2019-04-28 DIAGNOSIS — F331 Major depressive disorder, recurrent, moderate: Secondary | ICD-10-CM

## 2019-04-28 NOTE — Progress Notes (Signed)
Virtual Visit via Telephone Note  I connected with Erica [name] on 04/28/19 at  1:00 PM EST by telephone and verified that I am speaking with the correct person using two identifiers.  Location: Patient: Home Provider: Office   I discussed the limitations, risks, security and privacy concerns of performing an evaluation and management service by telephone and the availability of in person appointments. I also discussed with the patient that there may be a patient responsible charge related to this service. The patient expressed understanding and agreed to proceed.      THERAPIST PROGRESS NOTE  Session Time: 1:00PM-1:45PM  Participation Level: Active  Behavioral Response: CasualAlertDepressed  Type of Therapy: Individual Therapy  Treatment Goals addressed: Depression  Interventions: CBT  Summary: Erica [name] is a 39 y.o. female who presents with Depression, Anxiety, and OCD. The OPT therapist worked with the client for her ongoing OPT treatment. The OPT therapist utilized Motivational Interviewing to assist in creating therapeutic repore. The patient in the session was engaged and work in collaboration giving feedback about her triggers and symptoms over the past few weeks including her the trigger of her relationship. The OPT therapist utilized Cognitive Behavioral Therapy through cognitive restructuring as well as worked with the patient on coping strategies to assist in management of her anxiety and mood symptoms.  Suicidal/Homicidal: Nowithout intent/plan  Suicidal/Homicidal: Nowithout intent/plan  Therapist Response: The OPT therapist worked with the patient for the patients scheduled session. The patient was engaged in her session and gave feedback in relation to triggers, symptoms, and behavior responses over the past few weeks including her partner moving into the back yard in a camper and the interaction/involvment with her partner over the past few weeks. The OPT  therapist worked with the patient utilizing an in session Cognitive Behavioral Therapy exercise. The patient was responsive in the session and verbalized, " I am using a lot of I statements and continue to work on my communication". The OPT therapist for holistic care inquired with the patient about her medication and the patient noted concern due to a insurance change around her Seroquel and that she is currently working with her insurance, provider, and pharmacy to try to resolve the matter. The OPT therapist will continue treatment work with the patient in her next scheduled session.  Plan: Return again in 2 weeks.  Diagnosis: Axis I: Major depressive disorder, recurrent episode, moderate and Generalized anxiety disorder andObsessive-compulsive disorder, unspecified type    Axis II: No diagnosis  I discussed the assessment and treatment plan with the patient. The patient was provided an opportunity to ask questions and all were answered. The patient agreed with the plan and demonstrated an understanding of the instructions.   The patient was advised to call back or seek an in-person evaluation if the symptoms worsen or if the condition fails to improve as anticipated.  I provided 45 minutes of non-face-to-face time during this encounter.  Lennox Grumbles, LCSW 04/28/2019

## 2019-04-29 NOTE — Telephone Encounter (Signed)
Medication management - Called pt to get her insurance information to assist with prior authorization for Seroquel.  Patient stated her pharmacy had contacted her and the medication was ready for pick up. Pt did not have her card with her but stated she is now with [employer] and will provide new insurance card to out office with next visit.  Patient to call back if any problems getting medications filled.

## 2019-05-13 ENCOUNTER — Ambulatory Visit (INDEPENDENT_AMBULATORY_CARE_PROVIDER_SITE_OTHER): Payer: 59 | Admitting: Clinical

## 2019-05-13 ENCOUNTER — Other Ambulatory Visit: Payer: Self-pay

## 2019-05-13 DIAGNOSIS — F429 Obsessive-compulsive disorder, unspecified: Secondary | ICD-10-CM | POA: Diagnosis not present

## 2019-05-13 DIAGNOSIS — F411 Generalized anxiety disorder: Secondary | ICD-10-CM | POA: Diagnosis not present

## 2019-05-13 DIAGNOSIS — F331 Major depressive disorder, recurrent, moderate: Secondary | ICD-10-CM | POA: Diagnosis not present

## 2019-05-13 NOTE — Progress Notes (Signed)
Virtual Visit via Telephone Note  I connected with Erica [name] on 05/13/19 at  1:00 PM EDT by telephone and verified that I am speaking with the correct person using two identifiers.  Location: Patient: Home Provider: Office   I discussed the limitations, risks, security and privacy concerns of performing an evaluation and management service by telephone and the availability of in person appointments. I also discussed with the patient that there may be a patient responsible charge related to this service. The patient expressed understanding and agreed to proceed.           THERAPIST PROGRESS NOTE  Session Time: 1:00PM-1:40PM  Participation Level: Active  Behavioral Response: CasualAlertDepressed  Type of Therapy: Individual Therapy  Treatment Goals addressed: Anger and Depression   Interventions: CBT, Motivational Interviewing and Solution Focused  Summary: Erica [name] is a 39 y.o. female who presents with  Depression, Anxiety, and OCD. The OPT therapist worked with the patient for herongoing OPT treatment. The OPT therapist utilized Motivational Interviewing to assist in creating therapeutic repore. The patient in the session was engaged and work in Science writer about hertriggers and symptoms over the past few weeks including her long work hours due to tax session and managing irritability as a  product of being tired from working 60-70hrs per week. The OPT therapist utilized Cognitive Behavioral Therapy through cognitive restructuring as well as worked with the patient on coping strategies to assist in management of her anxiety and mood symptoms(Reading, Writing, Journaling, taking warm showers).   Suicidal/Homicidal: Nowithout intent/plan  Therapist Response: The OPT therapist worked with the patient for the patients scheduled session. The patient was engaged in hersession and gave feedback in relation to triggers, symptoms, and behavior responses over  the past few weeksincluding ongoing interactions with her partner, parenting, and managing work stress. The OPT therapist worked with the patient utilizing an in session Cognitive Behavioral Therapy exercise. The patient was responsive in the session and verbalized, " I am trying to read and write each day even if its only for 15 minutes and I take really hot showers". The OPT therapist for holistic care inquired with the patient about her medication and the patient verbalized compliance to her medication therapy. The OPT therapist will continue treatment work with the patient in hernext scheduled session.  Plan: Return again in 2 weeks.  Diagnosis: Axis I: Major depressive disorder, recurrent episode, moderate and Generalized anxiety disorder andObsessive-compulsive disorder, unspecified type    Axis II: No diagnosis  I discussed the assessment and treatment plan with the patient. The patient was provided an opportunity to ask questions and all were answered. The patient agreed with the plan and demonstrated an understanding of the instructions.   The patient was advised to call back or seek an in-person evaluation if the symptoms worsen or if the condition fails to improve as anticipated.  I provided 40 minutes of non-face-to-face time during this encounter.   Lennox Grumbles, LCSW 05/13/2019

## 2019-06-03 ENCOUNTER — Ambulatory Visit (INDEPENDENT_AMBULATORY_CARE_PROVIDER_SITE_OTHER): Payer: 59 | Admitting: Clinical

## 2019-06-03 ENCOUNTER — Other Ambulatory Visit: Payer: Self-pay

## 2019-06-03 DIAGNOSIS — F331 Major depressive disorder, recurrent, moderate: Secondary | ICD-10-CM

## 2019-06-03 DIAGNOSIS — F429 Obsessive-compulsive disorder, unspecified: Secondary | ICD-10-CM | POA: Diagnosis not present

## 2019-06-03 DIAGNOSIS — F411 Generalized anxiety disorder: Secondary | ICD-10-CM

## 2019-06-03 NOTE — Progress Notes (Signed)
Virtual Visit via Telephone Note  I connected with Erica [name] on 06/03/19 at  1:00 PM EDT by telephone and verified that I am speaking with the correct person using two identifiers.  Location: Patient: Home Provider: Office   I discussed the limitations, risks, security and privacy concerns of performing an evaluation and management service by telephone and the availability of in person appointments. I also discussed with the patient that there may be a patient responsible charge related to this service. The patient expressed understanding and agreed to proceed.  THERAPIST PROGRESS NOTE  Session Time: 1:00PM-1:45PM  Participation Level: Active  Behavioral Response: CasualAlertIrritable  Type of Therapy: Individual Therapy  Treatment Goals addressed: Coping  Interventions: CBT, DBT, Motivational Interviewing, Solution Focused and Supportive  Summary: Erica [name] is a 39 y.o. female who presents withDepression, Anxiety, and OCD.The OPT therapist worked with the patient for herongoing OPT treatment. The OPT therapist utilized Motivational Interviewing to assist in creating therapeutic repore. The patient in the session was engaged and work in Science writer about hertriggers and symptoms over the past few weeksincluding her ongoing difficulty to manage balance due to long work hours due to tax session and managing irritability as a  product of being tired from working 60-70hrs per week. The OPT therapist utilized Cognitive Behavioral Therapy through cognitive restructuring as well as worked with the patient on coping strategies to assist in management ofher anxiety and moodsymptoms. The patient indicated increased dedication to using coping strategies to help her manage her work life balance more efficiently.    Suicidal/Homicidal: Nowithout intent/plan  Therapist Response:The OPT therapist worked with the patient for the patients scheduled session. The patient was  engaged in hersession and gave feedback in relation to triggers, symptoms, and behavior responses over the pastfewweeks. The OPT therapist worked with the patient utilizing an in session Cognitive Behavioral Therapy exercise. The patient was responsive in the session and verbalized, " I have done a bon-fire cookout, went shopping, and I am trying to eat right and drink more water".The OPT therapist for holistic care inquired with the patient about her medication and the patient verbalized compliance to her medication therapy.The OPT therapist will continue treatment work with the patient in hernext scheduled session.  Plan: Return again in 2/3 weeks.  Diagnosis: Axis I: Major depressive disorder, recurrent episode, moderateandGeneralized anxiety disorderand Obsessive-compulsive disorder, unspecified type    Axis II: No diagnosis   I discussed the assessment and treatment plan with the patient. The patient was provided an opportunity to ask questions and all were answered. The patient agreed with the plan and demonstrated an understanding of the instructions.   The patient was advised to call back or seek an in-person evaluation if the symptoms worsen or if the condition fails to improve as anticipated.  I provided 45 minutes of non-face-to-face time during this encounter.  Lennox Grumbles, LCSW 06/03/2019
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
## 2019-07-01 ENCOUNTER — Encounter (HOSPITAL_COMMUNITY): Payer: Self-pay | Admitting: Psychiatry

## 2019-07-01 ENCOUNTER — Telehealth (INDEPENDENT_AMBULATORY_CARE_PROVIDER_SITE_OTHER): Payer: 59 | Admitting: Psychiatry

## 2019-07-01 ENCOUNTER — Other Ambulatory Visit: Payer: Self-pay

## 2019-07-01 ENCOUNTER — Ambulatory Visit (HOSPITAL_COMMUNITY): Payer: Medicaid Other | Admitting: Psychiatry

## 2019-07-01 DIAGNOSIS — F3342 Major depressive disorder, recurrent, in full remission: Secondary | ICD-10-CM | POA: Diagnosis not present

## 2019-07-01 DIAGNOSIS — F4310 Post-traumatic stress disorder, unspecified: Secondary | ICD-10-CM | POA: Diagnosis not present

## 2019-07-01 MED ORDER — QUETIAPINE FUMARATE 50 MG PO TABS: 50.0000 mg | ORAL_TABLET | Freq: Every day | ORAL | 0 refills | Status: DC

## 2019-07-01 MED ORDER — BUPROPION HCL ER (XL) 300 MG PO TB24: 300.0000 mg | ORAL_TABLET | Freq: Every day | ORAL | 0 refills | Status: DC

## 2019-07-01 NOTE — Patient Instructions (Signed)
1. Continue Sertraline 200 mg daily  2.Continuebupropion 300 mg daily  3. Decrease quetiapine 25 mg at night for one week, then discontinue 4.TSH reviewed; wnl 5.Next appointment:6/18 at 9 AM

## 2019-07-03 ENCOUNTER — Encounter: Payer: Self-pay | Admitting: Family

## 2019-07-03 ENCOUNTER — Telehealth (INDEPENDENT_AMBULATORY_CARE_PROVIDER_SITE_OTHER): Payer: 59 | Admitting: Family

## 2019-07-03 DIAGNOSIS — R6889 Other general symptoms and signs: Secondary | ICD-10-CM

## 2019-07-03 MED ORDER — PREDNISONE 10 MG (21) PO TBPK: ORAL_TABLET | ORAL | 0 refills | Status: DC

## 2019-07-03 MED ORDER — BENZONATATE 200 MG PO CAPS: 200.0000 mg | ORAL_CAPSULE | Freq: Three times a day (TID) | ORAL | 1 refills | Status: DC | PRN

## 2019-07-03 MED ORDER — ONDANSETRON HCL 4 MG PO TABS: 4.0000 mg | ORAL_TABLET | Freq: Three times a day (TID) | ORAL | 0 refills | Status: DC | PRN

## 2019-07-03 NOTE — Progress Notes (Signed)
Virtual Visit via telephone Note Due to COVID-19 pandemic this visit was conducted virtually. This visit type was conducted due to national recommendations for restrictions regarding the COVID-19 Pandemic (e.g. social distancing, sheltering in place) in an effort to limit this patient's exposure and mitigate transmission in our community. All issues noted in this document were discussed and addressed.  A physical exam was not performed with this format.  I connected with Erica [name] on 07/03/19 at 10:05 Am by telephone and verified that I am speaking with the correct person using two identifiers. Erica [name] is currently located at home  and no one  is currently with her during visit. The provider, Evelina Dun, FNP is located in their office at time of visit.  I discussed the limitations, risks, security and privacy concerns of performing an evaluation and management service by telephone and the availability of in person appointments. I also discussed with the patient that there may be a patient responsible charge related to this service. The patient expressed understanding and agreed to proceed.   History and Present Illness:  Pt calls the office today with cough, body aches, headaches, and low grade fever that started Monday. She reports she took a rapid COVID test and it was negative. Cough This is a new problem. The current episode started in the past 7 days. The problem has been unchanged. The problem occurs every few minutes. The cough is productive of purulent sputum. Associated symptoms include ear congestion, a fever, headaches, myalgias and nasal congestion. Pertinent negatives include no ear pain or wheezing. She has tried rest and OTC cough suppressant for the symptoms. The treatment provided mild relief.       Review of Systems  Constitutional: Positive for fever.  HENT: Negative for ear pain.   Respiratory: Positive for cough. Negative for wheezing.   Musculoskeletal:  Positive for myalgias.  Neurological: Positive for headaches.     Observations/Objective: No SOB or distress, congestion noted, weakness  Assessment and Plan: Erica [name] comes in today with chief complaint of No chief complaint on file.   Diagnosis and orders addressed:  1. Flu-like symptoms - Take meds as prescribed - Use a cool mist humidifier  -Use saline nose sprays frequently -Force fluids -For any cough or congestion  Use plain Mucinex- regular strength or max strength is fine -For fever or aces or pains- take tylenol or ibuprofen. -Throat lozenges if help -Call if symptoms worsen or do no improve Work note sent to MyChart - predniSONE (STERAPRED UNI-PAK 21 TAB) 10 MG (21) TBPK tablet; Use as directed  Dispense: 21 tablet; Refill: 0 - benzonatate (TESSALON) 200 MG capsule; Take 1 capsule (200 mg total) by mouth 3 (three) times daily as needed.  Dispense: 30 capsule; Refill: 1 - ondansetron (ZOFRAN) 4 MG tablet; Take 1 tablet (4 mg total) by mouth every 8 (eight) hours as needed for nausea or vomiting.  Dispense: 20 tablet; Refill: 0      I discussed the assessment and treatment plan with the patient. The patient was provided an opportunity to ask questions and all were answered. The patient agreed with the plan and demonstrated an understanding of the instructions.   The patient was advised to call back or seek an in-person evaluation if the symptoms worsen or if the condition fails to improve as anticipated.  The above assessment and management plan was discussed with the patient. The patient verbalized understanding of and has agreed to the management plan. Patient  is aware to call the clinic if symptoms persist or worsen. Patient is aware when to return to the clinic for a follow-up visit. Patient educated on when it is appropriate to go to the emergency department.   Time call ended:  10:20 Am  I provided 15 minutes of non-face-to-face time during this  encounter.    Evelina Dun, FNP
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
## 2019-08-15 ENCOUNTER — Encounter (HOSPITAL_COMMUNITY): Payer: Self-pay | Admitting: Psychiatry

## 2019-08-15 ENCOUNTER — Telehealth (INDEPENDENT_AMBULATORY_CARE_PROVIDER_SITE_OTHER): Payer: 59 | Admitting: Psychiatry

## 2019-08-15 ENCOUNTER — Other Ambulatory Visit: Payer: Self-pay

## 2019-08-15 DIAGNOSIS — F3341 Major depressive disorder, recurrent, in partial remission: Secondary | ICD-10-CM

## 2019-08-15 DIAGNOSIS — F4310 Post-traumatic stress disorder, unspecified: Secondary | ICD-10-CM

## 2019-08-15 DIAGNOSIS — F429 Obsessive-compulsive disorder, unspecified: Secondary | ICD-10-CM

## 2019-08-15 MED ORDER — BUPROPION HCL ER (XL) 300 MG PO TB24: 300.0000 mg | ORAL_TABLET | Freq: Every day | ORAL | 0 refills | Status: DC

## 2019-08-15 MED ORDER — PRAZOSIN HCL 1 MG PO CAPS: ORAL_CAPSULE | ORAL | 0 refills | Status: DC

## 2019-08-15 MED ORDER — SERTRALINE HCL 100 MG PO TABS: 200.0000 mg | ORAL_TABLET | Freq: Every day | ORAL | 0 refills | Status: DC

## 2019-08-15 NOTE — Patient Instructions (Signed)
1. Continue Sertraline 200 mg daily  2.Continuebupropion 300 mg daily  3. Start prazosin 1 mg at night for 3 days, then 2 mg at night 4. Next appointment:7/28 at 11 AM

## 2019-09-03 ENCOUNTER — Ambulatory Visit: Payer: 59 | Admitting: Family Medicine
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
## 2019-09-24 ENCOUNTER — Telehealth (INDEPENDENT_AMBULATORY_CARE_PROVIDER_SITE_OTHER): Payer: 59 | Admitting: Psychiatry

## 2019-09-24 ENCOUNTER — Other Ambulatory Visit: Payer: Self-pay

## 2019-09-24 ENCOUNTER — Encounter (HOSPITAL_COMMUNITY): Payer: Self-pay | Admitting: Psychiatry

## 2019-09-24 DIAGNOSIS — F429 Obsessive-compulsive disorder, unspecified: Secondary | ICD-10-CM | POA: Diagnosis not present

## 2019-09-24 DIAGNOSIS — F3341 Major depressive disorder, recurrent, in partial remission: Secondary | ICD-10-CM

## 2019-09-24 DIAGNOSIS — F4310 Post-traumatic stress disorder, unspecified: Secondary | ICD-10-CM | POA: Diagnosis not present
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
## 2019-09-24 MED ORDER — BUPROPION HCL ER (XL) 300 MG PO TB24: 300.0000 mg | ORAL_TABLET | Freq: Every day | ORAL | 0 refills | Status: DC

## 2019-09-24 MED ORDER — SERTRALINE HCL 100 MG PO TABS: 200.0000 mg | ORAL_TABLET | Freq: Every day | ORAL | 0 refills | Status: DC

## 2019-09-24 NOTE — Patient Instructions (Signed)
1. Continue sertraline 200 mg daily  2.Continuebupropion 300 mg daily  3. Next appointment: 10/20 at 10 AM

## 2019-11-07 ENCOUNTER — Encounter: Payer: 59 | Admitting: Nurse Practitioner

## 2019-12-17 ENCOUNTER — Telehealth (HOSPITAL_COMMUNITY): Payer: 59 | Admitting: Psychiatry
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
## 2019-12-22 ENCOUNTER — Telehealth (INDEPENDENT_AMBULATORY_CARE_PROVIDER_SITE_OTHER): Payer: BC Managed Care – PPO | Admitting: Psychiatry

## 2019-12-22 ENCOUNTER — Encounter (HOSPITAL_COMMUNITY): Payer: Self-pay | Admitting: Psychiatry

## 2019-12-22 ENCOUNTER — Other Ambulatory Visit: Payer: Self-pay

## 2019-12-22 DIAGNOSIS — F3342 Major depressive disorder, recurrent, in full remission: Secondary | ICD-10-CM | POA: Diagnosis not present

## 2019-12-22 DIAGNOSIS — F429 Obsessive-compulsive disorder, unspecified: Secondary | ICD-10-CM | POA: Diagnosis not present

## 2019-12-22 DIAGNOSIS — F3341 Major depressive disorder, recurrent, in partial remission: Secondary | ICD-10-CM

## 2019-12-22 DIAGNOSIS — F4310 Post-traumatic stress disorder, unspecified: Secondary | ICD-10-CM | POA: Diagnosis not present

## 2019-12-22 NOTE — Patient Instructions (Signed)
1. Decrease sertraline 150 mg daily  2.Continuebupropion 300 mg daily  3. Next appointment:  12/6 at 2:50
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
## 2020-01-28 ENCOUNTER — Telehealth: Payer: Self-pay
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
## 2020-01-29 ENCOUNTER — Other Ambulatory Visit: Payer: Self-pay

## 2020-01-29 ENCOUNTER — Encounter: Payer: Self-pay | Admitting: Psychiatry

## 2020-01-29 ENCOUNTER — Telehealth (INDEPENDENT_AMBULATORY_CARE_PROVIDER_SITE_OTHER): Payer: BC Managed Care – PPO | Admitting: Psychiatry

## 2020-01-29 DIAGNOSIS — F330 Major depressive disorder, recurrent, mild: Secondary | ICD-10-CM | POA: Diagnosis not present

## 2020-01-29 DIAGNOSIS — F4310 Post-traumatic stress disorder, unspecified: Secondary | ICD-10-CM | POA: Diagnosis not present

## 2020-01-29 MED ORDER — ESCITALOPRAM OXALATE 10 MG PO TABS: ORAL_TABLET | ORAL | 1 refills | Status: DC

## 2020-01-29 MED ORDER — BUPROPION HCL ER (XL) 300 MG PO TB24: 300.0000 mg | ORAL_TABLET | Freq: Every day | ORAL | 0 refills | Status: DC
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
## 2020-01-29 NOTE — Telephone Encounter (Signed)
I advised the front desk to have follow up appointment so that I can support her the best given I have not seen her for over a month.

## 2020-02-02 ENCOUNTER — Telehealth (HOSPITAL_COMMUNITY): Payer: BC Managed Care – PPO | Admitting: Psychiatry

## 2020-02-02 ENCOUNTER — Telehealth: Payer: Medicaid Other | Admitting: Psychiatry
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
## 2020-03-04 ENCOUNTER — Encounter: Payer: Self-pay | Admitting: Psychiatry

## 2020-03-04 ENCOUNTER — Other Ambulatory Visit: Payer: Self-pay

## 2020-03-04 ENCOUNTER — Telehealth (INDEPENDENT_AMBULATORY_CARE_PROVIDER_SITE_OTHER): Payer: BC Managed Care – PPO | Admitting: Psychiatry

## 2020-03-04 DIAGNOSIS — F3341 Major depressive disorder, recurrent, in partial remission: Secondary | ICD-10-CM

## 2020-03-04 DIAGNOSIS — F4310 Post-traumatic stress disorder, unspecified: Secondary | ICD-10-CM

## 2020-03-04 MED ORDER — ESCITALOPRAM OXALATE 10 MG PO TABS: 10.0000 mg | ORAL_TABLET | Freq: Every day | ORAL | 0 refills | Status: DC

## 2020-03-04 MED ORDER — BUPROPION HCL ER (XL) 300 MG PO TB24: 300.0000 mg | ORAL_TABLET | Freq: Every day | ORAL | 0 refills | Status: DC

## 2020-03-04 NOTE — Patient Instructions (Signed)
1. Continue lexapro 10 mg daily  2. Continue bupropion 300 mg daily  3. Next appointment: 4/14 at 1 PM

## 2020-03-08 ENCOUNTER — Telehealth: Payer: Self-pay

## 2020-03-08 NOTE — Telephone Encounter (Signed)
ER follow up made

## 2020-03-10 ENCOUNTER — Other Ambulatory Visit: Payer: Self-pay

## 2020-03-10 ENCOUNTER — Ambulatory Visit: Payer: BC Managed Care – PPO | Admitting: Family Medicine

## 2020-03-10 VITALS — BP 108/78 | HR 85 | Temp 98.1°F | Ht 69.0 in | Wt 238.0 lb

## 2020-03-10 DIAGNOSIS — R519 Headache, unspecified: Secondary | ICD-10-CM | POA: Diagnosis not present

## 2020-03-10 DIAGNOSIS — R040 Epistaxis: Secondary | ICD-10-CM

## 2020-03-10 DIAGNOSIS — G43011 Migraine without aura, intractable, with status migrainosus: Secondary | ICD-10-CM | POA: Diagnosis not present

## 2020-03-10 MED ORDER — METHYLPREDNISOLONE ACETATE 80 MG/ML IJ SUSP
80.0000 mg | Freq: Once | INTRAMUSCULAR | Status: AC
  Administered 2020-03-10: 80 mg via INTRAMUSCULAR

## 2020-03-10 NOTE — Progress Notes (Signed)
Subjective: CC: ED follow up for migraine headaches PCP: Claretta Fraise, MD Erica [name] is a 40 y.o. female presenting to clinic today for:  1. Migraine headaches Patient was seen at [facility] emergency department on 03/07/2020 for migraine headache.  She had CT scan which was negative.  She was treated with Fioricet, Zofran and Decadron which relieved the migraine.   She notes that symptoms are refractory to the above treatments.  She has severe pain along the left temple and feels that there is a knot on that side that is getting larger over the last several months.  She reports hyperalgesia along the scalp and face.  She has seen neurology for this in the past but told that "it was outside of their area of expertise".  She has had a history of previous temporal artery biopsy that was negative for GCA  Previous treatments tried include gabapentin, Nurtec.  Currently taking Maxalt and Topamax.  Never treated with injectable including Botox.  Reports some intermittent tingling in the tips of her fingers bilaterally.  Her migraines typically are accompanied by photophobia, phonophobia, nausea.  She notes that in the emergency department she was told that she "might have a clot in her vessel on her scalp" but that it was "outside of their area" and no further work-up was done.   ROS: Per HPI  Allergies  Allergen Reactions  . Amoxicillin Anaphylaxis    Has patient had a PCN reaction causing immediate rash, facial/tongue/throat swelling, SOB or lightheadedness with hypotension: Yes Has patient had a PCN reaction causing severe rash involving mucus membranes or skin necrosis: No Has patient had a PCN reaction that required hospitalization: Yes Has patient had a PCN reaction occurring within the last 10 years: Yes If all of the above answers are "NO", then may proceed with Cephalosporin use.   . Effexor [Venlafaxine] Hives  . Other Hives    Poppy Seeds   Past Medical History:  Diagnosis  Date  . Anemia    with pregnancy  . Anxiety   . Cervical radiculitis   . Dysrhythmia    Irregular heart beat  associated with abnormal tachycardia  . Endometriosis   . Finger fracture, left 10/2004  . Hemangioma 03/12/2017   3.5 cm benign hemangioma in the posterior right hepatic lobe  . History of appendicitis   . Irregular heart beat   . Migraines   . OCD (obsessive compulsive disorder)   . Ovarian cyst    left, hemorrhagic (1) right side (2)  . PONV (postoperative nausea and vomiting)   . Sciatica   . Umbilical hernia   . Wears glasses     Current Outpatient Medications:  .  [START ON 04/28/2020] buPROPion (WELLBUTRIN XL) 300 MG 24 hr tablet, Take 1 tablet (300 mg total) by mouth daily., Disp: 90 tablet, Rfl: 0 .  escitalopram (LEXAPRO) 10 MG tablet, Take 1 tablet (10 mg total) by mouth daily., Disp: 90 tablet, Rfl: 0 .  ondansetron (ZOFRAN) 4 MG tablet, Take 1 tablet (4 mg total) by mouth every 8 (eight) hours as needed for nausea or vomiting., Disp: 20 tablet, Rfl: 0 .  rizatriptan (MAXALT-MLT) 10 MG disintegrating tablet, Take 1 tablet (10 mg total) by mouth as needed for migraine. May repeat in 2 hours if needed, Disp: 9 tablet, Rfl: 11 .  topiramate (TOPAMAX) 50 MG tablet, Take 0.5 tablet daily for 2 weeks, then increase to 1 tablet daily, Disp: 90 tablet, Rfl: 3 Social History  Socioeconomic History  . Marital status: Single    Spouse name: Not on file  . Number of children: 2  . Years of education: 70  . Highest education level: Not on file  Occupational History  . Occupation: [employer]  Tobacco Use  . Smoking status: Former Smoker    Packs/day: 1.00    Years: 10.00    Pack years: 10.00    Types: Cigarettes    Quit date: 01/15/2013    Years since quitting: 7.1  . Smokeless tobacco: Never Used  . Tobacco comment: Quit smoking almost 2 years ago  Vaping Use  . Vaping Use: Never used  Substance and Sexual Activity  . Alcohol use: Yes     Alcohol/week: 0.0 standard drinks    Comment: occassional  . Drug use: No  . Sexual activity: Yes    Birth control/protection: Surgical  Other Topics Concern  . Not on file  Social History Narrative   Lives with fiancee and 2 children in a one story home.  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  Education: currently in 70rd year of college.right handed    Social Determinants of Health   Financial Resource Strain: Not on file  Food Insecurity: Not on file  Transportation Needs: Not on file  Physical Activity: Not on file  Stress: Not on file  Social Connections: Not on file  Intimate Partner Violence: Not on file   Family History  Problem Relation Age of Onset  . Cancer Maternal Grandmother 21       ovarian, and uterine  . Cancer Mother 30       ovarian and Breast  . Bipolar disorder Mother   . Bipolar disorder Sister   . Depression Brother   . Healthy Son        x 2    Objective: Office vital signs reviewed. BP 108/78   Pulse 85   Temp 98.1 F (36.7 C) (Temporal)   Ht 5\' 9"  (1.753 m)   Wt 238 lb (108 kg)   LMP  (Within Weeks)   SpO2 97%   BMI 35.15 kg/m   Physical Examination:  General: Awake, alert, well nourished, appears uncomfortable HEENT: Sclera white.  PERRLA.  EOMI; she does have slight fullness on the left side of her temporal area about midway in her scalp.  However I do wonder if this is simply a prominent cranial suture as there was no appreciable pulsatile characteristics.  Hyperalgesia noted; no active bleeding in the nares appreciated.  She had slight deviation with narrowed nare on the left.   MSK: normal gait and station Skin: dry; intact; no rashes or lesions Neuro: Cranial nerves appear to be grossly intact.  No focal neurologic deficits.  Assessment/ Plan: 40 y.o. 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  She had a fairly normal neurologic evaluation both in the emergency department and here.  Imaging of her brain was unremarkable.  I do think that she needs to follow-up with her neurologist.  I do not see where she is actually even been evaluated by neurology in over a year and a half now.  She might benefit from alternative preventative therapies including injectable versus Botox.  Would continue to follow-up with psychiatry as well as if there is uncontrolled psychiatric issues  going on this may be manifesting as physical pain  She was given a Depo-Medrol shot here in office in efforts to alleviate her symptoms.  She has Fioricet and her other migraine medicines at home  With regards to epistaxis the frequency and duration nosebleeds that she described over the last 2 days was quite concerning but again there was no evidence of active or hemostatic bleed on today's exam.  She is to follow-up with Dr. Benjamine Mola for ongoing evaluation of this; perhaps he can evaluate further up her nose  No orders of the defined types were placed in this encounter.  No orders of the defined types were placed in this encounter.    Erica Norlander, DO [place] [phone]

## 2020-03-10 NOTE — Patient Instructions (Signed)
Please schedule a follow up visit with your Neurologist ASAP.  I could not find a note from her in over a year.  Consider alternative migraine treatment (Emgality, Ajovy, botox injections etc) since Topamax does not appear to be helpful.  I am referring you to vascular surgery. I'm not sure how much they can offer but they may be able to at least determine if your symptoms are really vascular in nature or if they are manifestations of your migraines  See ENT ASAP for frequent nose bleeds.

## 2020-03-11 ENCOUNTER — Telehealth: Payer: Self-pay

## 2020-03-11 NOTE — Telephone Encounter (Signed)
Patient called to let us know that after appointment with Dr. 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 She then went to get an at home test and tested positive. As of yesterday she did not have symptoms but woke up this morning and was feeling bad and wanted to let us know.

## 2020-03-30 ENCOUNTER — Other Ambulatory Visit: Payer: Self-pay | Admitting: Neurology

## 2020-04-05 ENCOUNTER — Ambulatory Visit (INDEPENDENT_AMBULATORY_CARE_PROVIDER_SITE_OTHER): Payer: BC Managed Care – PPO | Admitting: Family Medicine

## 2020-04-05 ENCOUNTER — Encounter: Payer: Self-pay | Admitting: Family Medicine

## 2020-04-05 ENCOUNTER — Other Ambulatory Visit: Payer: Self-pay

## 2020-04-05 VITALS — BP 117/83 | HR 86 | Temp 97.5°F | Ht 69.0 in | Wt 239.2 lb

## 2020-04-05 DIAGNOSIS — G43009 Migraine without aura, not intractable, without status migrainosus: Secondary | ICD-10-CM | POA: Diagnosis not present

## 2020-04-05 DIAGNOSIS — L989 Disorder of the skin and subcutaneous tissue, unspecified: Secondary | ICD-10-CM

## 2020-04-05 MED ORDER — KETOROLAC TROMETHAMINE 60 MG/2ML IM SOLN
60.0000 mg | Freq: Once | INTRAMUSCULAR | Status: AC
  Administered 2020-04-05: 60 mg via INTRAMUSCULAR

## 2020-04-05 MED ORDER — TOPIRAMATE 50 MG PO TABS: 75.0000 mg | ORAL_TABLET | Freq: Every day | ORAL | 1 refills | Status: DC

## 2020-04-05 MED ORDER — METHYLPREDNISOLONE ACETATE 80 MG/ML IJ SUSP
80.0000 mg | Freq: Once | INTRAMUSCULAR | Status: DC
Start: 2020-04-05 — End: 2020-04-05

## 2020-04-05 MED ORDER — METHYLPREDNISOLONE ACETATE 40 MG/ML IJ SUSP
80.0000 mg | Freq: Once | INTRAMUSCULAR | Status: AC
  Administered 2020-04-05: 80 mg via INTRAMUSCULAR

## 2020-04-05 NOTE — Progress Notes (Signed)
Subjective:  Patient ID: Erica [name], female    DOB: 11-Apr-1980  Age: 40 y.o. MRN: [id number]  CC: Lump on head (Left temple/)   HPI Erica [name] presents for ongoing headaches at the left temporal area.  The lump that has been present for over 2 years has been growing.  It has slowly developed from the size of a grain of rice to a size of a green bean.  It causes her to have daily 8/10 pain which she describes as a dull ache.  She does not get significant relief with medications.  She has been taking 1 Topamax daily for over a year.  Since the nodules on the outside of the head her neurologist told her that it was out of her area of expertise.  On the other hand she had a temporal artery biopsy 2 years ago that was negative.  Today she is here for a new referral for someone that can give her relief from the headache  Depression screen [facility] 2/9 04/05/2020 03/10/2020 05/14/2017  Decreased Interest 0 0 0  Down, Depressed, Hopeless 0 0 0  PHQ - 2 Score 0 0 0  Altered sleeping - 0 -  Tired, decreased energy - 0 -  Change in appetite - 0 -  Feeling bad or failure about yourself  - 0 -  Trouble concentrating - 0 -  Moving slowly or fidgety/restless - 0 -  Suicidal thoughts - 0 -  PHQ-9 Score - 0 -    History Erica [name] has a past medical history of Anemia, Anxiety, Cervical radiculitis, Dysrhythmia, Endometriosis, Finger fracture, left (10/2004), Hemangioma (03/12/2017), History of appendicitis, Irregular heart beat, Migraines, OCD (obsessive compulsive disorder), Ovarian cyst, PONV (postoperative nausea and vomiting), Sciatica, Umbilical hernia, and Wears glasses.   She has a past surgical history that includes Appendectomy; Dilation and curettage of uterus; Tonsillectomy and adenoidectomy (Bilateral, 06/13/2016); spinal tap; Diagnostic laparoscopy; Wisdom tooth extraction; Artery Biopsy (Left, 10/25/2017); Laparoscopic tubal ligation (Bilateral, 12/12/2017); Closed reduction finger with percutaneous  pinning (Right, 04/10/2018); and Hardware Removal (Right, 05/08/2018).   Her family history includes Bipolar disorder in her mother and sister; Cancer (age of onset: 108) in her maternal grandmother; Cancer (age of onset: 41) in her mother; Depression in her brother; Healthy in her son.She reports that she quit smoking about 7 years ago. Her smoking use included cigarettes. She has a 10.00 pack-year smoking history. She has never used smokeless tobacco. She reports current alcohol use. She reports that she does not use drugs.    ROS Review of Systems  Constitutional: Negative.   HENT: Negative.   Eyes: Negative for visual disturbance.  Respiratory: Negative for shortness of breath.   Cardiovascular: Negative for chest pain.  Gastrointestinal: Negative for abdominal pain.  Musculoskeletal: Negative for arthralgias.  Neurological: Positive for headaches.    Objective:  BP 117/83   Pulse 86   Temp (!) 97.5 F (36.4 C) (Temporal)   Ht 5\' 9"  (1.753 m)   Wt 239 lb 3.2 oz (108.5 kg)   BMI 35.32 kg/m   BP Readings from Last 3 Encounters:  04/05/20 117/83  03/10/20 108/78  11/25/18 138/72    Wt Readings from Last 3 Encounters:  04/05/20 239 lb 3.2 oz (108.5 kg)  03/10/20 238 lb (108 kg)  11/25/18 230 lb (104.3 kg)     Physical Exam Constitutional:      General: She is not in acute distress.    Appearance: She is well-developed and well-nourished.  Cardiovascular:     Rate and Rhythm: Normal rate and regular rhythm.  Pulmonary:     Breath sounds: Normal breath sounds.  Skin:    General: Skin is warm and dry.  Neurological:     General: No focal deficit present.     Mental Status: She is alert and oriented to person, place, and time.     Motor: No weakness.     Coordination: Coordination normal.     Gait: Gait normal.  Psychiatric:        Mood and Affect: Mood and affect and mood normal.        Behavior: Behavior normal.        Thought Content: Thought content normal.        Assessment & Plan:   Erica [name] was seen today for lump on head.  Diagnoses and all orders for this visit:  Migraine without aura and without status migrainosus, not intractable -     ketorolac (TORADOL) injection 60 mg -     Discontinue: methylPREDNISolone acetate (DEPO-MEDROL) injection 80 mg -     methylPREDNISolone acetate (DEPO-MEDROL) injection 80 mg -     Ambulatory referral to Vascular Surgery  Scalp lesion -     Ambulatory referral to Vascular Surgery  Other orders -     topiramate (TOPAMAX) 50 MG tablet; Take 1.5 tablets (75 mg total) by mouth daily.       I have changed Erica [name]'s topiramate. I am also having her maintain her rizatriptan, ondansetron, buPROPion, escitalopram, and Butalbital-APAP-Caffeine. We administered ketorolac and methylPREDNISolone acetate.  Allergies as of 04/05/2020      Reactions   Amoxicillin Anaphylaxis   Has patient had a PCN reaction causing immediate rash, facial/tongue/throat swelling, SOB or lightheadedness with hypotension: Yes Has patient had a PCN reaction causing severe rash involving mucus membranes or skin necrosis: No Has patient had a PCN reaction that required hospitalization: Yes Has patient had a PCN reaction occurring within the last 10 years: Yes If all of the above answers are "NO", then may proceed with Cephalosporin use.   Effexor [venlafaxine] Hives   Other Hives   Poppy Seeds      Medication List       Accurate as of April 05, 2020  9:54 PM. If you have any questions, ask your nurse or doctor.        buPROPion 300 MG 24 hr tablet Commonly known as: WELLBUTRIN XL Take 1 tablet (300 mg total) by mouth daily. Start taking on: April 28, 2020   Butalbital-APAP-Caffeine 50-300-40 MG Caps Take by mouth.   escitalopram 10 MG tablet Commonly known as: LEXAPRO Take 1 tablet (10 mg total) by mouth daily.   ondansetron 4 MG tablet Commonly known as: Zofran Take 1 tablet (4 mg total) by mouth every  8 (eight) hours as needed for nausea or vomiting.   rizatriptan 10 MG disintegrating tablet Commonly known as: MAXALT-MLT Take 1 tablet (10 mg total) by mouth as needed for migraine. May repeat in 2 hours if needed   topiramate 50 MG tablet Commonly known as: TOPAMAX Take 1.5 tablets (75 mg total) by mouth daily. What changed:   how much to take  how to take this  when to take this  additional instructions Changed by: Claretta Fraise, MD        Follow-up: Return in about 6 months (around 10/03/2020).  Claretta Fraise, M.D.

## 2020-05-17 ENCOUNTER — Ambulatory Visit (INDEPENDENT_AMBULATORY_CARE_PROVIDER_SITE_OTHER): Payer: BC Managed Care – PPO | Admitting: Family Medicine

## 2020-05-17 ENCOUNTER — Encounter: Payer: Self-pay | Admitting: Family Medicine

## 2020-05-17 ENCOUNTER — Other Ambulatory Visit: Payer: Self-pay

## 2020-05-17 VITALS — BP 105/73 | HR 76 | Temp 98.3°F | Ht 69.0 in | Wt 242.4 lb

## 2020-05-17 DIAGNOSIS — R220 Localized swelling, mass and lump, head: Secondary | ICD-10-CM | POA: Diagnosis not present

## 2020-05-17 MED ORDER — PREDNISONE 20 MG PO TABS: ORAL_TABLET | ORAL | 0 refills | Status: AC

## 2020-05-17 NOTE — Progress Notes (Signed)
Subjective:  Patient ID: NHYIRA LEANO, female    DOB: 20-Aug-1980  Age: 40 y.o. MRN: [id number]  CC: Headache   HPI DAFNA ROMO presents for need for FMLA. Upcoming removal of painful lesion at left temple. There is a 5 day preop quarantine and 2-4 weeks post procedure  recovery time. The procedure will be done  May 5, if Covid test is normal at [place] [facility].  She will need FMLA completed for work for a single absence through this time. Pain is moderately severe, It is chronic.    No relief with tylenol, triptan or advil. Pain is 8/10. PUlsating. Worse the more she has to talk.  Depression screen [facility] 2/9 05/17/2020 04/05/2020 03/10/2020  Decreased Interest 0 0 0  Down, Depressed, Hopeless 0 0 0  PHQ - 2 Score 0 0 0  Altered sleeping - - 0  Tired, decreased energy - - 0  Change in appetite - - 0  Feeling bad or failure about yourself  - - 0  Trouble concentrating - - 0  Moving slowly or fidgety/restless - - 0  Suicidal thoughts - - 0  PHQ-9 Score - - 0    History Peggy has a past medical history of Anemia, Anxiety, Cervical radiculitis, Dysrhythmia, Endometriosis, Finger fracture, left (10/2004), Hemangioma (03/12/2017), History of appendicitis, Irregular heart beat, Migraines, OCD (obsessive compulsive disorder), Ovarian cyst, PONV (postoperative nausea and vomiting), Sciatica, Umbilical hernia, and Wears glasses.   She has a past surgical history that includes Appendectomy; Dilation and curettage of uterus; Tonsillectomy and adenoidectomy (Bilateral, 06/13/2016); spinal tap; Diagnostic laparoscopy; Wisdom tooth extraction; Artery Biopsy (Left, 10/25/2017); Laparoscopic tubal ligation (Bilateral, 12/12/2017); Closed reduction finger with percutaneous pinning (Right, 04/10/2018); and Hardware Removal (Right, 05/08/2018).   Her family history includes Bipolar disorder in her mother and sister; Cancer (age of onset: 41) in her maternal grandmother; Cancer (age of onset: 44) in her  mother; Depression in her brother; Healthy in her son.She reports that she quit smoking about 7 years ago. Her smoking use included cigarettes. She has a 10.00 pack-year smoking history. She has never used smokeless tobacco. She reports current alcohol use. She reports that she does not use drugs.    ROS Review of Systems  Constitutional: Negative for activity change and appetite change.  Neurological: Positive for headaches.    Objective:  BP 105/73   Pulse 76   Temp 98.3 F (36.8 C)   Ht 5\' 9"  (1.753 m)   Wt 242 lb 6.4 oz (110 kg)   SpO2 97%   BMI 35.80 kg/m   BP Readings from Last 3 Encounters:  05/17/20 105/73  04/05/20 117/83  03/10/20 108/78    Wt Readings from Last 3 Encounters:  05/17/20 242 lb 6.4 oz (110 kg)  04/05/20 239 lb 3.2 oz (108.5 kg)  03/10/20 238 lb (108 kg)     Physical Exam Constitutional:      General: She is not in acute distress.    Appearance: She is well-developed.  Cardiovascular:     Rate and Rhythm: Normal rate and regular rhythm.  Pulmonary:     Breath sounds: Normal breath sounds.  Skin:    General: Skin is warm and dry.  Neurological:     Mental Status: She is alert and oriented to person, place, and time.       Assessment & Plan:   Marlynn was seen today for headache.  Diagnoses and all orders for this visit:  Scalp mass  Other  orders -     predniSONE (DELTASONE) 20 MG tablet; One twice daily with food for 2 weeks. Then one daily for 2 weeks   Patient has arrangements made to have this lesion removed.  Hopefully this will give her the relief she wants and deserves from her chronic pain in that area.  FMLA papers to be completed for this time 40 single extended absence up to about a month.    I am having Samuel Germany. Pasqual start on predniSONE. I am also having her maintain her rizatriptan, ondansetron, buPROPion, escitalopram, Butalbital-APAP-Caffeine, and topiramate.  Allergies as of 05/17/2020      Reactions    Amoxicillin Anaphylaxis   Has patient had a PCN reaction causing immediate rash, facial/tongue/throat swelling, SOB or lightheadedness with hypotension: Yes Has patient had a PCN reaction causing severe rash involving mucus membranes or skin necrosis: No Has patient had a PCN reaction that required hospitalization: Yes Has patient had a PCN reaction occurring within the last 10 years: Yes If all of the above answers are "NO", then may proceed with Cephalosporin use.   Effexor [venlafaxine] Hives   Other Hives   Poppy Seeds      Medication List       Accurate as of May 17, 2020  5:10 PM. If you have any questions, ask your nurse or doctor.        buPROPion 300 MG 24 hr tablet Commonly known as: WELLBUTRIN XL Take 1 tablet (300 mg total) by mouth daily.   Butalbital-APAP-Caffeine 50-300-40 MG Caps Take by mouth.   escitalopram 10 MG tablet Commonly known as: LEXAPRO Take 1 tablet (10 mg total) by mouth daily.   ondansetron 4 MG tablet Commonly known as: Zofran Take 1 tablet (4 mg total) by mouth every 8 (eight) hours as needed for nausea or vomiting.   predniSONE 20 MG tablet Commonly known as: DELTASONE One twice daily with food for 2 weeks. Then one daily for 2 weeks Started by: Claretta Fraise, MD   rizatriptan 10 MG disintegrating tablet Commonly known as: MAXALT-MLT Take 1 tablet (10 mg total) by mouth as needed for migraine. May repeat in 2 hours if needed   topiramate 50 MG tablet Commonly known as: TOPAMAX Take 1.5 tablets (75 mg total) by mouth daily.        Follow-up: No follow-ups on file.  Claretta Fraise, M.D.
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
## 2020-06-04 ENCOUNTER — Ambulatory Visit: Payer: Self-pay | Admitting: General Surgery

## 2020-06-10 ENCOUNTER — Other Ambulatory Visit: Payer: Self-pay

## 2020-06-10 ENCOUNTER — Encounter: Payer: Self-pay | Admitting: Psychiatry

## 2020-06-10 ENCOUNTER — Telehealth (INDEPENDENT_AMBULATORY_CARE_PROVIDER_SITE_OTHER): Payer: BC Managed Care – PPO | Admitting: Psychiatry

## 2020-06-10 DIAGNOSIS — F330 Major depressive disorder, recurrent, mild: Secondary | ICD-10-CM | POA: Diagnosis not present

## 2020-06-10 DIAGNOSIS — F4310 Post-traumatic stress disorder, unspecified: Secondary | ICD-10-CM

## 2020-06-10 MED ORDER — ESCITALOPRAM OXALATE 10 MG PO TABS: 10.0000 mg | ORAL_TABLET | Freq: Every day | ORAL | 0 refills | Status: DC

## 2020-06-10 NOTE — Patient Instructions (Signed)
1. Continue lexapro 10 mg daily  2. Continue bupropion 300 mg daily  3. Next appointment: 5/25 at 9:30

## 2020-06-23 NOTE — Patient Instructions (Signed)
DUE TO COVID-19 ONLY ONE VISITOR IS ALLOWED TO COME WITH YOU AND STAY IN THE WAITING ROOM ONLY DURING PRE OP AND PROCEDURE DAY OF SURGERY. THE 1 VISITOR  MAY VISIT WITH YOU AFTER SURGERY IN YOUR PRIVATE ROOM DURING VISITING HOURS ONLY!  YOU NEED TO HAVE A COVID 19 TEST ON: 06/28/20 @ 10:00 AM, THIS TEST MUST BE DONE BEFORE SURGERY,  COVID TESTING SITE [place] [address], IT IS ON THE RIGHT GOING OUT [address] APPROXIMATELY  2 MINUTES PAST [employer] ON THE RIGHT. ONCE YOUR COVID TEST IS COMPLETED,  PLEASE BEGIN THE QUARANTINE INSTRUCTIONS AS OUTLINED IN YOUR HANDOUT.                Erica [name]    Your procedure is scheduled on: 07/01/20   Report to [facility] Main  Entrance   Report to admitting at: 9:00 AM     Call this number if you have problems the morning of surgery [phone]    Remember: Do not eat solid food :After Midnight. Clear liquids until: 8:00 am.  CLEAR LIQUID DIET  Foods Allowed                                                                     Foods Excluded  Coffee and tea, regular and decaf                             liquids that you cannot  Plain Jell-O any favor except red or purple                                           see through such as: Fruit ices (not with fruit pulp)                                     milk, soups, orange juice  Iced Popsicles                                    All solid food Carbonated beverages, regular and diet                                    Cranberry, grape and apple juices Sports drinks like Gatorade Lightly seasoned clear broth or consume(fat free) Sugar, honey syrup  Sample Menu Breakfast                                Lunch                                     Supper Cranberry juice  Beef broth                            Chicken broth Jell-O                                     Grape juice                           Apple juice Coffee or tea                         Jell-O                                      Popsicle                                                Coffee or tea                        Coffee or tea  _____________________________________________________________________  BRUSH YOUR TEETH MORNING OF SURGERY AND RINSE YOUR MOUTH OUT, NO CHEWING GUM CANDY OR MINTS.    Take these medicines the morning of surgery with A SIP OF WATER: bupropion,escitalopram.                               You may not have any metal on your body including hair pins and              piercings  Do not wear jewelry, make-up, lotions, powders or perfumes, deodorant             Do not wear nail polish on your fingernails.  Do not shave  48 hours prior to surgery.    Do not bring valuables to the hospital. [place].  Contacts, dentures or bridgework may not be worn into surgery.  Leave suitcase in the car. After surgery it may be brought to your room.     Patients discharged the day of surgery will not be allowed to drive home. IF YOU ARE HAVING SURGERY AND GOING HOME THE SAME DAY, YOU MUST HAVE AN ADULT TO DRIVE YOU HOME AND BE WITH YOU FOR 24 HOURS. YOU MAY GO HOME BY TAXI OR UBER OR ORTHERWISE, BUT AN ADULT MUST ACCOMPANY YOU HOME AND STAY WITH YOU FOR 24 HOURS.  Name and phone number of your driver:  Special Instructions: N/A              Please read over the following fact sheets you were given: _____________________________________________________________________          [facility] - Preparing for Surgery Before surgery, you can play an important role.  Because skin is not sterile, your skin needs to be as free of germs as possible.  You can reduce the number of germs on your skin by washing with CHG (chlorahexidine gluconate) soap before surgery.  CHG is an antiseptic  cleaner which kills germs and bonds with the skin to continue killing germs even after washing. Please DO NOT use if you have an  allergy to CHG or antibacterial soaps.  If your skin becomes reddened/irritated stop using the CHG and inform your nurse when you arrive at Short Stay. Do not shave (including legs and underarms) for at least 48 hours prior to the first CHG shower.  You may shave your face/neck. Please follow these instructions carefully:  1.  Shower with CHG Soap the night before surgery and the  morning of Surgery.  2.  If you choose to wash your hair, wash your hair first as usual with your  normal  shampoo.  3.  After you shampoo, rinse your hair and body thoroughly to remove the  shampoo.                           4.  Use CHG as you would any other liquid soap.  You can apply chg directly  to the skin and wash                       Gently with a scrungie or clean washcloth.  5.  Apply the CHG Soap to your body ONLY FROM THE NECK DOWN.   Do not use on face/ open                           Wound or open sores. Avoid contact with eyes, ears mouth and genitals (private parts).                       Wash face,  Genitals (private parts) with your normal soap.             6.  Wash thoroughly, paying special attention to the area where your surgery  will be performed.  7.  Thoroughly rinse your body with warm water from the neck down.  8.  DO NOT shower/wash with your normal soap after using and rinsing off  the CHG Soap.                9.  Pat yourself dry with a clean towel.            10.  Wear clean pajamas.            11.  Place clean sheets on your bed the night of your first shower and do not  sleep with pets. Day of Surgery : Do not apply any lotions/deodorants the morning of surgery.  Please wear clean clothes to the hospital/surgery center.  FAILURE TO FOLLOW THESE INSTRUCTIONS MAY RESULT IN THE CANCELLATION OF YOUR SURGERY PATIENT SIGNATURE_________________________________  NURSE SIGNATURE__________________________________  ________________________________________________________________________

## 2020-06-24 ENCOUNTER — Other Ambulatory Visit: Payer: Self-pay

## 2020-06-24 ENCOUNTER — Encounter (HOSPITAL_COMMUNITY): Payer: Self-pay

## 2020-06-24 ENCOUNTER — Encounter (HOSPITAL_COMMUNITY)
Admission: RE | Admit: 2020-06-24 | Discharge: 2020-06-24 | Disposition: A | Discharge status: Home / Self Care | Payer: BC Managed Care – PPO | Source: Ambulatory Visit | Attending: General Surgery | Admitting: General Surgery

## 2020-06-24 DIAGNOSIS — Z01818 Encounter for other preprocedural examination: Secondary | ICD-10-CM | POA: Diagnosis present

## 2020-06-24 HISTORY — DX: Hemangioma of intra-abdominal structures: D18.03

## 2020-06-24 LAB — CBC
HCT: 40.6 % (ref 36.0–46.0)
Hemoglobin: 13.1 g/dL (ref 12.0–15.0)
MCH: 29.4 pg (ref 26.0–34.0)
MCHC: 32.3 g/dL (ref 30.0–36.0)
MCV: 91 fL (ref 80.0–100.0)
Platelets: 289 10*3/uL (ref 150–400)
RBC: 4.46 MIL/uL (ref 3.87–5.11)
RDW: 13.6 % (ref 11.5–15.5)
WBC: 7.2 10*3/uL (ref 4.0–10.5)
nRBC: 0 % (ref 0.0–0.2)

## 2020-06-24 LAB — BASIC METABOLIC PANEL
Anion gap: 8 (ref 5–15)
BUN: 11 mg/dL (ref 6–20)
CO2: 22 mmol/L (ref 22–32)
Calcium: 8.8 mg/dL — ABNORMAL LOW (ref 8.9–10.3)
Chloride: 110 mmol/L (ref 98–111)
Creatinine, Ser: 0.9 mg/dL (ref 0.44–1.00)
GFR, Estimated: >60 mL/min (ref >60)
Glucose, Bld: 96 mg/dL (ref 70–99)
Potassium: 3.5 mmol/L (ref 3.5–5.1)
Sodium: 140 mmol/L (ref 135–145)

## 2020-06-24 NOTE — Progress Notes (Signed)
COVID Vaccine Completed: NO Date COVID Vaccine completed: COVID vaccine manufacturer: [employer]   PCP - Dr. Cletus Gash Stacks: LOV: 05/17/20 Cardiologist -   Chest x-ray -  EKG -  Stress Test -  ECHO -  Cardiac Cath -  Pacemaker/ICD device last checked:  Sleep Study -  CPAP -   Fasting Blood Sugar -  Checks Blood Sugar _____ times a day  Blood Thinner Instructions: Aspirin Instructions: Last Dose:  Anesthesia review: Hx: Dysrhythmias  Patient denies shortness of breath, fever, cough and chest pain at PAT appointment   Patient verbalized understanding of instructions that were given to them at the PAT appointment. Patient was also instructed that they will need to review over the PAT instructions again at home before surgery.

## 2020-06-25 NOTE — Progress Notes (Signed)
Final EKG done 06/24/20 in epic.   

## 2020-06-28 ENCOUNTER — Other Ambulatory Visit (HOSPITAL_COMMUNITY)
Admission: RE | Admit: 2020-06-28 | Discharge: 2020-06-28 | Disposition: A | Discharge status: Home / Self Care | Payer: BC Managed Care – PPO | Source: Ambulatory Visit | Attending: General Surgery | Admitting: General Surgery

## 2020-06-28 DIAGNOSIS — R220 Localized swelling, mass and lump, head: Secondary | ICD-10-CM | POA: Diagnosis present

## 2020-06-28 DIAGNOSIS — Z880 Allergy status to penicillin: Secondary | ICD-10-CM | POA: Diagnosis not present

## 2020-06-28 DIAGNOSIS — Z91018 Allergy to other foods: Secondary | ICD-10-CM | POA: Diagnosis not present

## 2020-06-28 DIAGNOSIS — Z01812 Encounter for preprocedural laboratory examination: Secondary | ICD-10-CM | POA: Insufficient documentation

## 2020-06-28 DIAGNOSIS — Z888 Allergy status to other drugs, medicaments and biological substances status: Secondary | ICD-10-CM | POA: Diagnosis not present

## 2020-06-28 DIAGNOSIS — Z79899 Other long term (current) drug therapy: Secondary | ICD-10-CM | POA: Diagnosis not present

## 2020-06-28 DIAGNOSIS — Z20822 Contact with and (suspected) exposure to covid-19: Secondary | ICD-10-CM | POA: Diagnosis not present

## 2020-06-28 DIAGNOSIS — Z87891 Personal history of nicotine dependence: Secondary | ICD-10-CM | POA: Diagnosis not present

## 2020-06-29 LAB — SARS CORONAVIRUS 2 (TAT 6-24 HRS): SARS Coronavirus 2: NEGATIVE

## 2020-07-01 ENCOUNTER — Encounter (HOSPITAL_COMMUNITY): Payer: Self-pay | Admitting: General Surgery

## 2020-07-01 ENCOUNTER — Ambulatory Visit (HOSPITAL_COMMUNITY): Payer: BC Managed Care – PPO | Admitting: Physician Assistant

## 2020-07-01 ENCOUNTER — Encounter (HOSPITAL_COMMUNITY)
Admission: RE | Disposition: A | Discharge status: Home / Self Care | Payer: Self-pay | Source: Home / Self Care | Attending: General Surgery

## 2020-07-01 ENCOUNTER — Ambulatory Visit (HOSPITAL_COMMUNITY): Payer: BC Managed Care – PPO | Admitting: Anesthesiology

## 2020-07-01 ENCOUNTER — Ambulatory Visit (HOSPITAL_COMMUNITY)
Admission: RE | Admit: 2020-07-01 | Discharge: 2020-07-01 | Disposition: A | Discharge status: Home / Self Care | Payer: BC Managed Care – PPO | Attending: General Surgery | Admitting: General Surgery

## 2020-07-01 DIAGNOSIS — Z87891 Personal history of nicotine dependence: Secondary | ICD-10-CM | POA: Insufficient documentation

## 2020-07-01 DIAGNOSIS — Z888 Allergy status to other drugs, medicaments and biological substances status: Secondary | ICD-10-CM | POA: Insufficient documentation

## 2020-07-01 DIAGNOSIS — R220 Localized swelling, mass and lump, head: Secondary | ICD-10-CM | POA: Insufficient documentation

## 2020-07-01 DIAGNOSIS — Z79899 Other long term (current) drug therapy: Secondary | ICD-10-CM | POA: Insufficient documentation

## 2020-07-01 DIAGNOSIS — Z880 Allergy status to penicillin: Secondary | ICD-10-CM | POA: Insufficient documentation

## 2020-07-01 DIAGNOSIS — Z91018 Allergy to other foods: Secondary | ICD-10-CM | POA: Insufficient documentation

## 2020-07-01 DIAGNOSIS — Z20822 Contact with and (suspected) exposure to covid-19: Secondary | ICD-10-CM | POA: Insufficient documentation

## 2020-07-01 HISTORY — PX: MASS EXCISION: SHX2000

## 2020-07-01 LAB — PREGNANCY, URINE: Preg Test, Ur: NEGATIVE

## 2020-07-01 SURGERY — EXCISION MASS
Anesthesia: General

## 2020-07-01 MED ORDER — HYDROMORPHONE HCL 1 MG/ML IJ SOLN
INTRAMUSCULAR | Status: AC
  Administered 2020-07-01: 0.5 mg via INTRAVENOUS
  Filled 2020-07-01: qty 1

## 2020-07-01 MED ORDER — OXYCODONE HCL 5 MG PO TABS: 5.0000 mg | ORAL_TABLET | Freq: Once | ORAL | Status: AC | PRN

## 2020-07-01 MED ORDER — DIPHENHYDRAMINE HCL 50 MG/ML IJ SOLN
INTRAMUSCULAR | Status: DC | PRN
  Administered 2020-07-01: 12.5 mg via INTRAVENOUS

## 2020-07-01 MED ORDER — BUPIVACAINE LIPOSOME 1.3 % IJ SUSP
20.0000 mL | Freq: Once | INTRAMUSCULAR | Status: DC
  Filled 2020-07-01: qty 20

## 2020-07-01 MED ORDER — DEXMEDETOMIDINE (PRECEDEX) IN NS 20 MCG/5ML (4 MCG/ML) IV SYRINGE
PREFILLED_SYRINGE | INTRAVENOUS | Status: DC | PRN
  Administered 2020-07-01: 12 ug via INTRAVENOUS

## 2020-07-01 MED ORDER — KETOROLAC TROMETHAMINE 30 MG/ML IJ SOLN
INTRAMUSCULAR | Status: AC
  Administered 2020-07-01: 30 mg via INTRAVENOUS
  Filled 2020-07-01: qty 1

## 2020-07-01 MED ORDER — ACETAMINOPHEN 500 MG PO TABS
1000.0000 mg | ORAL_TABLET | Freq: Once | ORAL | Status: AC
  Administered 2020-07-01: 1000 mg via ORAL
  Filled 2020-07-01: qty 2

## 2020-07-01 MED ORDER — ONDANSETRON HCL 4 MG/2ML IJ SOLN
INTRAMUSCULAR | Status: DC | PRN
  Administered 2020-07-01: 4 mg via INTRAVENOUS

## 2020-07-01 MED ORDER — OXYCODONE HCL 5 MG/5ML PO SOLN
5.0000 mg | Freq: Once | ORAL | Status: AC | PRN
Start: 2020-07-01 — End: 2020-07-01

## 2020-07-01 MED ORDER — ORAL CARE MOUTH RINSE: 15.0000 mL | Freq: Once | OROMUCOSAL | Status: AC

## 2020-07-01 MED ORDER — DEXAMETHASONE SODIUM PHOSPHATE 4 MG/ML IJ SOLN
INTRAMUSCULAR | Status: DC | PRN
  Administered 2020-07-01: 5 mg via INTRAVENOUS

## 2020-07-01 MED ORDER — 0.9 % SODIUM CHLORIDE (POUR BTL) OPTIME
TOPICAL | Status: DC | PRN
  Administered 2020-07-01: 1000 mL

## 2020-07-01 MED ORDER — FENTANYL CITRATE (PF) 100 MCG/2ML IJ SOLN
INTRAMUSCULAR | Status: DC | PRN
  Administered 2020-07-01 (×2): 50 ug via INTRAVENOUS

## 2020-07-01 MED ORDER — CLINDAMYCIN PHOSPHATE 900 MG/50ML IV SOLN
900.0000 mg | INTRAVENOUS | Status: AC
  Administered 2020-07-01: 900 mg via INTRAVENOUS
  Filled 2020-07-01: qty 50

## 2020-07-01 MED ORDER — CHLORHEXIDINE GLUCONATE 0.12 % MT SOLN
15.0000 mL | Freq: Once | OROMUCOSAL | Status: AC
  Administered 2020-07-01: 15 mL via OROMUCOSAL

## 2020-07-01 MED ORDER — LACTATED RINGERS IV SOLN: INTRAVENOUS | Status: DC

## 2020-07-01 MED ORDER — BUPIVACAINE-EPINEPHRINE 0.25% -1:200000 IJ SOLN
INTRAMUSCULAR | Status: DC | PRN
  Administered 2020-07-01: 7 mL

## 2020-07-01 MED ORDER — CHLORHEXIDINE GLUCONATE CLOTH 2 % EX PADS: 6.0000 | MEDICATED_PAD | Freq: Once | CUTANEOUS | Status: DC

## 2020-07-01 MED ORDER — PROMETHAZINE HCL 25 MG/ML IJ SOLN: 6.2500 mg | INTRAMUSCULAR | Status: DC | PRN

## 2020-07-01 MED ORDER — MEPERIDINE HCL 50 MG/ML IJ SOLN: 6.2500 mg | INTRAMUSCULAR | Status: DC | PRN

## 2020-07-01 MED ORDER — FENTANYL CITRATE (PF) 100 MCG/2ML IJ SOLN
INTRAMUSCULAR | Status: AC
  Filled 2020-07-01: qty 2

## 2020-07-01 MED ORDER — BUPIVACAINE-EPINEPHRINE (PF) 0.25% -1:200000 IJ SOLN
INTRAMUSCULAR | Status: AC
  Filled 2020-07-01: qty 30

## 2020-07-01 MED ORDER — PROPOFOL 500 MG/50ML IV EMUL
INTRAVENOUS | Status: DC | PRN
  Administered 2020-07-01: 150 ug/kg/min via INTRAVENOUS

## 2020-07-01 MED ORDER — PROPOFOL 10 MG/ML IV BOLUS
INTRAVENOUS | Status: DC | PRN
  Administered 2020-07-01: 20 mg via INTRAVENOUS
  Administered 2020-07-01: 200 mg via INTRAVENOUS

## 2020-07-01 MED ORDER — KETOROLAC TROMETHAMINE 30 MG/ML IJ SOLN: 30.0000 mg | Freq: Once | INTRAMUSCULAR | Status: AC | PRN

## 2020-07-01 MED ORDER — MIDAZOLAM HCL 5 MG/5ML IJ SOLN
INTRAMUSCULAR | Status: DC | PRN
  Administered 2020-07-01: 2 mg via INTRAVENOUS

## 2020-07-01 MED ORDER — HYDROMORPHONE HCL 1 MG/ML IJ SOLN
0.2500 mg | INTRAMUSCULAR | Status: DC | PRN
  Administered 2020-07-01: 0.5 mg via INTRAVENOUS

## 2020-07-01 MED ORDER — LIDOCAINE 2% (20 MG/ML) 5 ML SYRINGE
INTRAMUSCULAR | Status: AC
  Filled 2020-07-01: qty 5

## 2020-07-01 MED ORDER — SCOPOLAMINE 1 MG/3DAYS TD PT72
1.0000 | MEDICATED_PATCH | TRANSDERMAL | Status: DC
  Administered 2020-07-01: 1.5 mg via TRANSDERMAL
  Filled 2020-07-01: qty 1

## 2020-07-01 MED ORDER — LIDOCAINE HCL (CARDIAC) PF 100 MG/5ML IV SOSY
PREFILLED_SYRINGE | INTRAVENOUS | Status: DC | PRN
  Administered 2020-07-01: 60 mg via INTRAVENOUS

## 2020-07-01 MED ORDER — DEXMEDETOMIDINE (PRECEDEX) IN NS 20 MCG/5ML (4 MCG/ML) IV SYRINGE
PREFILLED_SYRINGE | INTRAVENOUS | Status: AC
  Filled 2020-07-01: qty 5

## 2020-07-01 MED ORDER — OXYCODONE HCL 5 MG PO TABS
ORAL_TABLET | ORAL | Status: AC
  Administered 2020-07-01: 5 mg via ORAL
  Filled 2020-07-01: qty 1

## 2020-07-01 MED ORDER — MIDAZOLAM HCL 2 MG/2ML IJ SOLN
INTRAMUSCULAR | Status: AC
  Filled 2020-07-01: qty 2

## 2020-07-01 MED ORDER — ONDANSETRON HCL 4 MG/2ML IJ SOLN
INTRAMUSCULAR | Status: AC
  Filled 2020-07-01: qty 2

## 2020-07-01 MED ORDER — HYDROCODONE-ACETAMINOPHEN 5-325 MG PO TABS: 1.0000 | ORAL_TABLET | Freq: Four times a day (QID) | ORAL | 0 refills | Status: AC | PRN

## 2020-07-01 MED ORDER — DEXAMETHASONE SODIUM PHOSPHATE 10 MG/ML IJ SOLN
INTRAMUSCULAR | Status: AC
  Filled 2020-07-01: qty 1

## 2020-07-01 MED ORDER — IBUPROFEN 800 MG PO TABS: 800.0000 mg | ORAL_TABLET | Freq: Three times a day (TID) | ORAL | 0 refills | Status: AC | PRN

## 2020-07-01 SURGICAL SUPPLY — 34 items
ADH SKN CLS APL DERMABOND .7 (GAUZE/BANDAGES/DRESSINGS) ×1
APL PRP STRL LF DISP 70% ISPRP (MISCELLANEOUS)
APL SKNCLS STERI-STRIP NONHPOA (GAUZE/BANDAGES/DRESSINGS)
BENZOIN TINCTURE PRP APPL 2/3 (GAUZE/BANDAGES/DRESSINGS) IMPLANT
BLADE CLIPPER SURG (BLADE) IMPLANT
CHLORAPREP W/TINT 26 (MISCELLANEOUS) IMPLANT
COVER SURGICAL LIGHT HANDLE (MISCELLANEOUS) ×2 IMPLANT
COVER WAND RF STERILE (DRAPES) IMPLANT
DERMABOND ADVANCED (GAUZE/BANDAGES/DRESSINGS) ×1
DERMABOND ADVANCED .7 DNX12 (GAUZE/BANDAGES/DRESSINGS) IMPLANT
DRAPE LAPAROTOMY T 98X78 PEDS (DRAPES) ×2 IMPLANT
DRAPE UTILITY XL STRL (DRAPES) ×2 IMPLANT
DRSG TEGADERM 4X4.75 (GAUZE/BANDAGES/DRESSINGS) IMPLANT
ELECT REM PT RETURN 15FT ADLT (MISCELLANEOUS) ×2 IMPLANT
GAUZE SPONGE 4X4 12PLY STRL (GAUZE/BANDAGES/DRESSINGS) IMPLANT
GLOVE SURG POLYISO LF SZ7 (GLOVE) ×2 IMPLANT
GLOVE SURG UNDER POLY LF SZ7.5 (GLOVE) ×2 IMPLANT
GOWN STRL REUS W/TWL LRG LVL3 (GOWN DISPOSABLE) ×4 IMPLANT
GOWN STRL REUS W/TWL XL LVL3 (GOWN DISPOSABLE) ×2 IMPLANT
KIT BASIN OR (CUSTOM PROCEDURE TRAY) ×2 IMPLANT
KIT TURNOVER KIT A (KITS) ×2 IMPLANT
NDL HYPO 25X1 1.5 SAFETY (NEEDLE) ×1 IMPLANT
NEEDLE HYPO 25X1 1.5 SAFETY (NEEDLE) ×2 IMPLANT
PACK BASIC VI WITH GOWN DISP (CUSTOM PROCEDURE TRAY) ×2 IMPLANT
PENCIL SMOKE EVACUATOR (MISCELLANEOUS) IMPLANT
SPONGE LAP 18X18 RF (DISPOSABLE) ×2 IMPLANT
STRIP CLOSURE SKIN 1/2X4 (GAUZE/BANDAGES/DRESSINGS) IMPLANT
SUT MNCRL AB 4-0 PS2 18 (SUTURE) ×2 IMPLANT
SUT VIC AB 2-0 SH 27 (SUTURE)
SUT VIC AB 2-0 SH 27X BRD (SUTURE) ×1 IMPLANT
SUT VIC AB 3-0 SH 27 (SUTURE) ×2
SUT VIC AB 3-0 SH 27XBRD (SUTURE) ×1 IMPLANT
SYR CONTROL 10ML LL (SYRINGE) ×2 IMPLANT
TOWEL OR 17X26 10 PK STRL BLUE (TOWEL DISPOSABLE) ×2 IMPLANT

## 2020-07-01 NOTE — Anesthesia Procedure Notes (Signed)
Procedure Name: LMA Insertion Date/Time: 07/01/2020 11:02 AM Performed by: Raenette Rover, CRNA Pre-anesthesia Checklist: Patient identified, Emergency Drugs available, Suction available and Patient being monitored Patient Re-evaluated:Patient Re-evaluated prior to induction Oxygen Delivery Method: Circle system utilized Preoxygenation: Pre-oxygenation with 100% oxygen Induction Type: IV induction Ventilation: Mask ventilation without difficulty LMA: LMA inserted LMA Size: 4.0 Number of attempts: 1 Placement Confirmation: positive ETCO2 and breath sounds checked- equal and bilateral Tube secured with: Tape Dental Injury: Teeth and Oropharynx as per pre-operative assessment

## 2020-07-01 NOTE — Anesthesia Preprocedure Evaluation (Addendum)
Anesthesia Evaluation  Patient identified by MRN, date of birth, ID band Patient awake    Reviewed: Allergy & Precautions, NPO status , Patient's Chart, lab work & pertinent test results  History of Anesthesia Complications (+) PONV and history of anesthetic complications  Airway Mallampati: III  TM Distance: >3 FB Neck ROM: Full    Dental  (+) Teeth Intact, Dental Advisory Given, Chipped,    Pulmonary former smoker,  Quit smoking 2014, 10 pack year history    Pulmonary exam normal breath sounds clear to auscultation       Cardiovascular Normal cardiovascular exam Rhythm:Regular Rate:Normal     Neuro/Psych  Headaches, PSYCHIATRIC DISORDERS Anxiety Depression    GI/Hepatic negative GI ROS, Neg liver ROS,   Endo/Other  Obesity BMI 36  Renal/GU negative Renal ROS  negative genitourinary   Musculoskeletal negative musculoskeletal ROS (+)   Abdominal (+) + obese,   Peds  Hematology hct 40.6   Anesthesia Other Findings Scalp mass  Reproductive/Obstetrics negative OB ROS                            Anesthesia Physical Anesthesia Plan  ASA: II  Anesthesia Plan: General   Post-op Pain Management:    Induction: Intravenous  PONV Risk Score and Plan: Ondansetron, Dexamethasone, Treatment may vary due to age or medical condition, Midazolam, Scopolamine patch - Pre-op, Diphenhydramine, Propofol infusion and TIVA  Airway Management Planned: LMA  Additional Equipment: None  Intra-op Plan:   Post-operative Plan: Extubation in OR  Informed Consent: I have reviewed the patients History and Physical, chart, labs and discussed the procedure including the risks, benefits and alternatives for the proposed anesthesia with the patient or authorized representative who has indicated his/her understanding and acceptance.     Dental advisory given  Plan Discussed with: CRNA  Anesthesia Plan  Comments:        Anesthesia Quick Evaluation

## 2020-07-01 NOTE — Op Note (Signed)
Preoperative diagnosis: left forehead nodules  Postoperative diagnosis: same   Procedure: excision of subfascial left forehead nodules x 2  Surgeon: Gurney Maxin, M.D.  Asst: none  Anesthesia: general  Indications for procedure: Erica [name] is a 40 y.o. year old female with symptoms of headaches and pain over her left temple. She felt 2 distinct firm areas. After discussing options, she chose to proceed with exploration and removal of the nodules.  Description of procedure: The patient was brought into the operative suite. Anesthesia was administered with General LMA anesthesia. WHO checklist was applied. The patient was then placed in supine position. The area was prepped and draped in the usual sterile fashion.  Marcaine was injected into the area. Next, the previous incision was incised. Cautery and blunt dissection was used to dissect through the subcutaneous tissues. There was a palpable firmness in the superior temple. The fascia was incised in this area. Multiple areas of temporalis muscle were excised in the region of the lesion. No foreign body was seen.  Next, attention was turned to the inferior portion of the wound, there was a visible metallic clip identified and removed. No other nodule was palpated at the inferior aspect of the wound  Findings: metallic clip, scar around the temporalis muscle  Specimen: upper left forehead nodule, lower left forehead nodule  Implant: none   Blood loss: 10 ml  Local anesthesia: 6 ml marcaine   Complications: none  Gurney Maxin, M.D. General, Bariatric, & Minimally Invasive Surgery [facility]

## 2020-07-01 NOTE — Transfer of Care (Signed)
Immediate Anesthesia Transfer of Care Note  Patient: Erica [name]  Procedure(s) Performed: EXCISION SCALP MASS (N/A )  Patient Location: PACU  Anesthesia Type:General  Level of Consciousness: awake  Airway & Oxygen Therapy: Patient Spontanous Breathing and Patient connected to face mask oxygen  Post-op Assessment: Report given to RN and Post -op Vital signs reviewed and stable  Post vital signs: Reviewed and stable  Last Vitals:  Vitals Value Taken Time  BP 123/91 07/01/20 1200  Temp    Pulse 66 07/01/20 1201  Resp 13 07/01/20 1201  SpO2 100 % 07/01/20 1201  Vitals shown include unvalidated device data.  Last Pain:  Vitals:   07/01/20 1200  TempSrc:   PainSc: Asleep      Patients Stated Pain Goal: 7 (26/94/85 [id number])  Complications: No complications documented.

## 2020-07-01 NOTE — H&P (Signed)
Erica [name] is an 40 y.o. female.   Chief Complaint: head pain HPI: 40 yo female with pain over previous temporal biopsy. On imaging there are 2 metallic areas. She presents for exploration  Past Medical History:  Diagnosis Date  . Anemia    with pregnancy  . Anxiety   . Cervical radiculitis   . Dysrhythmia    Irregular heart beat  associated with abnormal tachycardia  . Endometriosis   . Finger fracture, left 10/2004  . Hemangioma 03/12/2017   3.5 cm benign hemangioma in the posterior right hepatic lobe  . Hemangioma of liver   . History of appendicitis   . Irregular heart beat   . Migraines   . OCD (obsessive compulsive disorder)   . Ovarian cyst    left, hemorrhagic (1) right side (2)  . PONV (postoperative nausea and vomiting)   . Sciatica   . Umbilical hernia   . Wears glasses     Past Surgical History:  Procedure Laterality Date  . APPENDECTOMY    . ARTERY BIOPSY Left 10/25/2017   Procedure: BIOPSY TEMPORAL ARTERY LEFT;  Surgeon: Kieth Brightly, Arta Bruce, MD;  Location: [place];  Service: General;  Laterality: Left;  . CLOSED REDUCTION FINGER WITH PERCUTANEOUS PINNING Right 04/10/2018   Procedure: CLOSED REDUCTION FINGER WITH PERCUTANEOUS PINNING;  Surgeon: Leandrew Koyanagi, MD;  Location: [place];  Service: Orthopedics;  Laterality: Right;  . DIAGNOSTIC LAPAROSCOPY    . DILATION AND CURETTAGE OF UTERUS    . HARDWARE REMOVAL Right 05/08/2018   Procedure: HARDWARE REMOVAL RIGHT HAND;  Surgeon: Leandrew Koyanagi, MD;  Location: [facility];  Service: Orthopedics;  Laterality: Right;  . LAPAROSCOPIC TUBAL LIGATION Bilateral 12/12/2017   Procedure: LAPAROSCOPIC BILATERAL TUBAL LIGATION PARTIAL SALPINGECTOMY;  Surgeon: Florian Buff, MD;  Location: [facility];  Service: Gynecology;  Laterality: Bilateral;  . spinal tap     several  . TONSILLECTOMY AND ADENOIDECTOMY Bilateral 06/13/2016   Procedure: TONSILLECTOMY AND ADENOIDECTOMY;   Surgeon: Leta Baptist, MD;  Location: [facility];  Service: ENT;  Laterality: Bilateral;  . WISDOM TOOTH EXTRACTION      Family History  Problem Relation Age of Onset  . Cancer Maternal Grandmother 21       ovarian, and uterine  . Cancer Mother 30       ovarian and Breast  . Bipolar disorder Mother   . Bipolar disorder Sister   . Depression Brother   . Healthy Son        x 2   Social History:  reports that she quit smoking about 7 years ago. Her smoking use included cigarettes. She has a 10.00 pack-year smoking history. She has never used smokeless tobacco. She reports current alcohol use. She reports that she does not use drugs.  Allergies:  Allergies  Allergen Reactions  . Amoxicillin Anaphylaxis    Has patient had a PCN reaction causing immediate rash, facial/tongue/throat swelling, SOB or lightheadedness with hypotension: Yes Has patient had a PCN reaction causing severe rash involving mucus membranes or skin necrosis: No Has patient had a PCN reaction that required hospitalization: Yes Has patient had a PCN reaction occurring within the last 10 years: Yes If all of the above answers are "NO", then may proceed with Cephalosporin use.   . Effexor [Venlafaxine] Hives  . Other Hives    Poppy Seeds    Medications Prior to Admission  Medication Sig Dispense Refill  . buPROPion (  WELLBUTRIN XL) 300 MG 24 hr tablet Take 1 tablet (300 mg total) by mouth daily. 90 tablet 0  . [START ON 07/02/2020] escitalopram (LEXAPRO) 10 MG tablet Take 1 tablet (10 mg total) by mouth daily. 90 tablet 0  . predniSONE (DELTASONE) 20 MG tablet One twice daily with food for 2 weeks. Then one daily for 2 weeks 42 tablet 0  . topiramate (TOPAMAX) 100 MG tablet Take 100 mg by mouth 2 (two) times daily.    . ondansetron (ZOFRAN) 4 MG tablet Take 1 tablet (4 mg total) by mouth every 8 (eight) hours as needed for nausea or vomiting. (Patient not taking: Reported on 06/15/2020) 20 tablet 0  .  rizatriptan (MAXALT-MLT) 10 MG disintegrating tablet Take 1 tablet (10 mg total) by mouth as needed for migraine. May repeat in 2 hours if needed (Patient not taking: Reported on 06/15/2020) 9 tablet 11  . topiramate (TOPAMAX) 50 MG tablet Take 1.5 tablets (75 mg total) by mouth daily. (Patient not taking: No sig reported) 135 tablet 1    Results for orders placed or performed during the hospital encounter of 07/01/20 (from the past 48 hour(s))  Pregnancy, urine per protocol     Status: None   Collection Time: 07/01/20  9:03 AM  Result Value Ref Range   Preg Test, Ur NEGATIVE NEGATIVE    Comment:        THE SENSITIVITY OF THIS METHODOLOGY IS >20 mIU/mL. Performed at [facility], [place] [address] [place], [place] [address]    No results found.  Review of Systems  Constitutional: Negative for chills and fever.  HENT: Negative for hearing loss.   Respiratory: Negative for cough.   Cardiovascular: Negative for chest pain and palpitations.  Gastrointestinal: Negative for abdominal pain, nausea and vomiting.  Genitourinary: Negative for dysuria and urgency.  Musculoskeletal: Negative for myalgias and neck pain.  Skin: Negative for rash.  Neurological: Negative for dizziness and headaches.  Hematological: Does not bruise/bleed easily.  Psychiatric/Behavioral: Negative for suicidal ideas.    Blood pressure 108/71, pulse 69, temperature 98.6 F (37 C), temperature source Oral, resp. rate 18, height 5\' 9"  (1.753 m), weight 109.3 kg, last menstrual period 06/20/2020, SpO2 100 %. Physical Exam Vitals reviewed.  Constitutional:      Appearance: She is well-developed.  HENT:     Head: Normocephalic and atraumatic.  Eyes:     Conjunctiva/sclera: Conjunctivae normal.     Pupils: Pupils are equal, round, and reactive to light.  Cardiovascular:     Rate and Rhythm: Normal rate and regular rhythm.  Pulmonary:     Effort: Pulmonary effort is normal.     Breath sounds:  Normal breath sounds.  Abdominal:     General: Bowel sounds are normal. There is no distension.     Palpations: Abdomen is soft.     Tenderness: There is no abdominal tenderness.  Musculoskeletal:        General: Normal range of motion.     Cervical back: Normal range of motion and neck supple.  Skin:    General: Skin is warm and dry.  Neurological:     Mental Status: She is alert and oriented to person, place, and time.  Psychiatric:        Behavior: Behavior normal.      Assessment/Plan 40 yo female with pain over left scalp. -excision of left scalp mass. -planned outpatient procedure  Mickeal Skinner, MD 07/01/2020, 10:29 AM

## 2020-07-01 NOTE — Anesthesia Postprocedure Evaluation (Signed)
Anesthesia Post Note  Patient: LEEBA BARBE  Procedure(s) Performed: EXCISION SCALP MASS (N/A )     Patient location during evaluation: PACU Anesthesia Type: General Level of consciousness: awake and alert, oriented and patient cooperative Pain management: pain level controlled Vital Signs Assessment: post-procedure vital signs reviewed and stable Respiratory status: spontaneous breathing, nonlabored ventilation and respiratory function stable Cardiovascular status: blood pressure returned to baseline and stable Postop Assessment: no apparent nausea or vomiting Anesthetic complications: no   No complications documented.  Last Vitals:  Vitals:   07/01/20 0903 07/01/20 1200  BP: 108/71 (!) 123/91  Pulse: 69 66  Resp: 18 13  Temp: 37 C   SpO2: 100% 99%    Last Pain:  Vitals:   07/01/20 1200  TempSrc:   PainSc: [place]

## 2020-07-02 ENCOUNTER — Encounter (HOSPITAL_COMMUNITY): Payer: Self-pay | Admitting: General Surgery

## 2020-07-13 LAB — SURGICAL PATHOLOGY

## 2020-07-21 ENCOUNTER — Telehealth: Payer: BC Managed Care – PPO | Admitting: Psychiatry
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
## 2020-07-28 ENCOUNTER — Encounter: Payer: Self-pay | Admitting: Psychiatry

## 2020-07-28 ENCOUNTER — Other Ambulatory Visit: Payer: Self-pay

## 2020-07-28 ENCOUNTER — Telehealth (INDEPENDENT_AMBULATORY_CARE_PROVIDER_SITE_OTHER): Payer: BC Managed Care – PPO | Admitting: Psychiatry

## 2020-07-28 DIAGNOSIS — F4310 Post-traumatic stress disorder, unspecified: Secondary | ICD-10-CM

## 2020-07-28 DIAGNOSIS — F3341 Major depressive disorder, recurrent, in partial remission: Secondary | ICD-10-CM

## 2020-07-28 MED ORDER — BUPROPION HCL ER (XL) 300 MG PO TB24: 300.0000 mg | ORAL_TABLET | Freq: Every day | ORAL | 0 refills | Status: AC

## 2020-07-28 MED ORDER — ESCITALOPRAM OXALATE 10 MG PO TABS: 10.0000 mg | ORAL_TABLET | Freq: Every day | ORAL | 0 refills | Status: AC

## 2020-07-28 NOTE — Patient Instructions (Signed)
1.Continue lexapro10 mg daily  2. Continue bupropion 300 mg daily 3. Please find a psychiatrist or primary care provider in the area for continuity of care.

## 2020-08-09 ENCOUNTER — Telehealth: Payer: Self-pay | Admitting: Neurology

## 2020-08-09 NOTE — Telephone Encounter (Signed)
Patient called in and is requesting a 90 day refill on her topamax. She is moving out of state. She also stated the directions were wrong. Also asking for a refill of Rivatriptan odt10mg  tab

## 2020-08-10 ENCOUNTER — Encounter: Payer: Self-pay | Admitting: Family Medicine

## 2020-08-10 ENCOUNTER — Other Ambulatory Visit (HOSPITAL_COMMUNITY)
Admission: RE | Admit: 2020-08-10 | Discharge: 2020-08-10 | Disposition: A | Discharge status: Home / Self Care | Payer: BC Managed Care – PPO | Source: Ambulatory Visit | Attending: Family Medicine | Admitting: Family Medicine

## 2020-08-10 ENCOUNTER — Other Ambulatory Visit: Payer: Self-pay

## 2020-08-10 ENCOUNTER — Ambulatory Visit (INDEPENDENT_AMBULATORY_CARE_PROVIDER_SITE_OTHER): Payer: BC Managed Care – PPO | Admitting: Family Medicine

## 2020-08-10 VITALS — BP 106/69 | HR 81 | Temp 98.0°F | Ht 69.0 in | Wt 233.8 lb

## 2020-08-10 DIAGNOSIS — Z124 Encounter for screening for malignant neoplasm of cervix: Secondary | ICD-10-CM | POA: Diagnosis present

## 2020-08-10 DIAGNOSIS — Z0001 Encounter for general adult medical examination with abnormal findings: Secondary | ICD-10-CM | POA: Diagnosis not present

## 2020-08-10 DIAGNOSIS — Z0000 Encounter for general adult medical examination without abnormal findings: Secondary | ICD-10-CM

## 2020-08-10 DIAGNOSIS — C801 Malignant (primary) neoplasm, unspecified: Secondary | ICD-10-CM

## 2020-08-10 LAB — URINALYSIS
Bilirubin, UA: NEGATIVE
Glucose, UA: NEGATIVE
Leukocytes,UA: NEGATIVE
Nitrite, UA: NEGATIVE
Protein,UA: NEGATIVE
Specific Gravity, UA: >1.03 — ABNORMAL HIGH (ref 1.005–1.030)
Urobilinogen, Ur: 0.2 mg/dL (ref 0.2–1.0)
pH, UA: 5 (ref 5.0–7.5)
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
## 2020-08-11 LAB — CMP14+EGFR
ALT: 20 IU/L (ref 0–32)
AST: 16 IU/L (ref 0–40)
Albumin/Globulin Ratio: 2.6 — ABNORMAL HIGH (ref 1.2–2.2)
Albumin: 5 g/dL — ABNORMAL HIGH (ref 3.8–4.8)
Alkaline Phosphatase: 64 IU/L (ref 44–121)
BUN/Creatinine Ratio: 10 (ref 9–23)
BUN: 10 mg/dL (ref 6–20)
Bilirubin Total: 0.4 mg/dL (ref 0.0–1.2)
CO2: 16 mmol/L — ABNORMAL LOW (ref 20–29)
Calcium: 9.2 mg/dL (ref 8.7–10.2)
Chloride: 107 mmol/L — ABNORMAL HIGH (ref 96–106)
Creatinine, Ser: 1.04 mg/dL — ABNORMAL HIGH (ref 0.57–1.00)
Globulin, Total: 1.9 g/dL (ref 1.5–4.5)
Glucose: 86 mg/dL (ref 65–99)
Potassium: 3.8 mmol/L (ref 3.5–5.2)
Sodium: 140 mmol/L (ref 134–144)
Total Protein: 6.9 g/dL (ref 6.0–8.5)
eGFR: 70 mL/min/{1.73_m2} (ref >59)

## 2020-08-11 LAB — CBC WITH DIFFERENTIAL/PLATELET
Basophils Absolute: 0.1 10*3/uL (ref 0.0–0.2)
Basos: 1 %
EOS (ABSOLUTE): 0.1 10*3/uL (ref 0.0–0.4)
Eos: 2 %
Hematocrit: 41.7 % (ref 34.0–46.6)
Hemoglobin: 13.7 g/dL (ref 11.1–15.9)
Immature Grans (Abs): 0 10*3/uL (ref 0.0–0.1)
Immature Granulocytes: 0 %
Lymphocytes Absolute: 2.1 10*3/uL (ref 0.7–3.1)
Lymphs: 36 %
MCH: 29 pg (ref 26.6–33.0)
MCHC: 32.9 g/dL (ref 31.5–35.7)
MCV: 88 fL (ref 79–97)
Monocytes Absolute: 0.5 10*3/uL (ref 0.1–0.9)
Monocytes: 8 %
Neutrophils Absolute: 3.1 10*3/uL (ref 1.4–7.0)
Neutrophils: 53 %
Platelets: 241 10*3/uL (ref 150–450)
RBC: 4.72 x10E6/uL (ref 3.77–5.28)
RDW: 13.2 % (ref 11.7–15.4)
WBC: 5.8 10*3/uL (ref 3.4–10.8)

## 2020-08-11 LAB — LIPID PANEL
Chol/HDL Ratio: 6.9 ratio — ABNORMAL HIGH (ref 0.0–4.4)
Cholesterol, Total: 221 mg/dL — ABNORMAL HIGH (ref 100–199)
HDL: 32 mg/dL — ABNORMAL LOW (ref >39)
LDL Chol Calc (NIH): 161 mg/dL — ABNORMAL HIGH (ref 0–99)
Triglycerides: 150 mg/dL — ABNORMAL HIGH (ref 0–149)
VLDL Cholesterol Cal: 28 mg/dL (ref 5–40)

## 2020-08-16 ENCOUNTER — Other Ambulatory Visit: Payer: Self-pay | Admitting: Family Medicine

## 2020-08-16 ENCOUNTER — Telehealth: Payer: Self-pay | Admitting: Family Medicine

## 2020-08-16 MED ORDER — ROSUVASTATIN CALCIUM 10 MG PO TABS: 10.0000 mg | ORAL_TABLET | Freq: Every day | ORAL | 1 refills | Status: AC

## 2020-08-16 NOTE — Telephone Encounter (Signed)
Pt called stating that she was supposed to have her appt with Dr Delton Coombes on 6/23 for her spinal cell carcinoma but says that they just called her to r/s the appt due to some kind of emergency. Her new appt is scheduled on 6/30 but pt wants to know if there is anywhere else we can send her so that she can see someone this week?  Please advise and call patient.

## 2020-08-17 ENCOUNTER — Telehealth: Payer: Self-pay | Admitting: Family Medicine

## 2020-08-17 LAB — CYTOLOGY - PAP
Adequacy: ABSENT
Comment: NEGATIVE
Diagnosis: UNDETERMINED — AB
High risk HPV: NEGATIVE

## 2020-08-17 NOTE — Telephone Encounter (Signed)
Pt moving out of state due to emergency on Friday 6/24 and would like to get an out of state referral if she is unable to get in to see her cancer doctor before the day she is moving. Please call back and advise.

## 2020-08-17 NOTE — Progress Notes (Signed)
Pt. Was notified of results and plan of action. Will See GYN in 3 months for repeat WS

## 2020-08-17 NOTE — Telephone Encounter (Signed)
ASCUS discussed with patient. I recommended she see GYN in three months at her new location.

## 2020-08-18 ENCOUNTER — Other Ambulatory Visit: Payer: Self-pay | Admitting: Family Medicine

## 2020-08-18 DIAGNOSIS — C801 Malignant (primary) neoplasm, unspecified: Secondary | ICD-10-CM

## 2020-08-18 NOTE — Telephone Encounter (Signed)
Referral placed, as requested WS 

## 2020-08-19 ENCOUNTER — Ambulatory Visit (HOSPITAL_COMMUNITY): Payer: BC Managed Care – PPO | Admitting: Hematology

## 2020-08-26 ENCOUNTER — Telehealth: Payer: Self-pay | Admitting: Family Medicine

## 2020-08-26 ENCOUNTER — Encounter (HOSPITAL_COMMUNITY): Payer: Self-pay

## 2020-08-26 ENCOUNTER — Ambulatory Visit (HOSPITAL_COMMUNITY): Payer: BC Managed Care – PPO | Admitting: Hematology

## 2020-08-26 NOTE — Telephone Encounter (Signed)
Tammy called from [facility] stating that they received a referral from [place] for patient but says that they need Op Report, Imaging, Pathology, and OV notes for referral regarding tumor.  Can fax to [phone] For questions, we can contact Kim at [phone]

## 2022-10-17 ENCOUNTER — Telehealth: Payer: Self-pay

## 2022-10-17 NOTE — Transitions of Care (Post Inpatient/ED Visit) (Unsigned)
   10/17/2022  Name: Erica [name] MRN: [id number] DOB: September 01, 1980  Today's TOC FU Call Status: Today's TOC FU Call Status:: Unsuccessful Call (1st Attempt) Unsuccessful Call (1st Attempt) Date: 10/17/22  Attempted to reach the patient regarding the most recent Inpatient/ED visit.  Follow Up Plan: Additional outreach attempts will be made to reach the patient to complete the Transitions of Care (Post Inpatient/ED visit) call.   Signature Karena Addison, LPN [facility] Nurse Health Advisor Direct Dial [phone]

## 2022-10-18 NOTE — Transitions of Care (Post Inpatient/ED Visit) (Signed)
   10/18/2022  Name: Erica [name] MRN: [id number] DOB: 1981-02-06  Today's TOC FU Call Status: Today's TOC FU Call Status:: Successful TOC FU Call Completed Unsuccessful Call (1st Attempt) Date: 10/17/22 [facility] FU Call Complete Date: 10/18/22  Transition Care Management Follow-up Telephone Call Date of Discharge: 10/16/22 Discharge Facility: Other ([facility]) Name of Other ([facility]) Discharge Facility: [facility] Type of Discharge: Emergency Department  Items Reviewed:    Medications Reviewed Today: Medications Reviewed Today   Medications were not reviewed in this encounter     Home Care and Equipment/Supplies:    Functional Questionnaire:    Follow up appointments reviewed:   Patient has moved out of state   SIGNATURE Karena Addison, LPN [facility] Nurse Health Advisor Direct Dial [phone]
